# Patient Record
Sex: Female | Born: 1950 | Race: White | Hispanic: No | Marital: Single | Smoking: Current every day smoker
Health system: Southern US, Community
[De-identification: ages and names within clinical notes are randomized; demographics above are authoritative.]

## PROBLEM LIST (undated history)

## (undated) DIAGNOSIS — K859 Acute pancreatitis without necrosis or infection, unspecified: Secondary | ICD-10-CM

## (undated) DIAGNOSIS — I613 Nontraumatic intracerebral hemorrhage in brain stem: Secondary | ICD-10-CM

## (undated) DIAGNOSIS — M1990 Unspecified osteoarthritis, unspecified site: Secondary | ICD-10-CM

## (undated) DIAGNOSIS — R569 Unspecified convulsions: Secondary | ICD-10-CM

## (undated) DIAGNOSIS — IMO0001 Reserved for inherently not codable concepts without codable children: Secondary | ICD-10-CM

## (undated) DIAGNOSIS — I10 Essential (primary) hypertension: Secondary | ICD-10-CM

## (undated) DIAGNOSIS — F32A Depression, unspecified: Secondary | ICD-10-CM

## (undated) DIAGNOSIS — I639 Cerebral infarction, unspecified: Secondary | ICD-10-CM

## (undated) DIAGNOSIS — G43909 Migraine, unspecified, not intractable, without status migrainosus: Secondary | ICD-10-CM

## (undated) DIAGNOSIS — E7800 Pure hypercholesterolemia, unspecified: Secondary | ICD-10-CM

## (undated) DIAGNOSIS — J449 Chronic obstructive pulmonary disease, unspecified: Secondary | ICD-10-CM

## (undated) DIAGNOSIS — B1920 Unspecified viral hepatitis C without hepatic coma: Secondary | ICD-10-CM

## (undated) DIAGNOSIS — J9690 Respiratory failure, unspecified, unspecified whether with hypoxia or hypercapnia: Secondary | ICD-10-CM

## (undated) DIAGNOSIS — F17210 Nicotine dependence, cigarettes, uncomplicated: Secondary | ICD-10-CM

## (undated) DIAGNOSIS — I6529 Occlusion and stenosis of unspecified carotid artery: Secondary | ICD-10-CM

## (undated) DIAGNOSIS — F419 Anxiety disorder, unspecified: Secondary | ICD-10-CM

## (undated) DIAGNOSIS — K219 Gastro-esophageal reflux disease without esophagitis: Secondary | ICD-10-CM

## (undated) DIAGNOSIS — F329 Major depressive disorder, single episode, unspecified: Secondary | ICD-10-CM

## (undated) HISTORY — DX: Major depressive disorder, single episode, unspecified: F32.9

## (undated) HISTORY — PX: CHOLECYSTECTOMY: SHX55

## (undated) HISTORY — DX: Occlusion and stenosis of unspecified carotid artery: I65.29

## (undated) HISTORY — DX: Depression, unspecified: F32.A

## (undated) HISTORY — DX: Pure hypercholesterolemia, unspecified: E78.00

## (undated) HISTORY — PX: APPENDECTOMY: SHX54

## (undated) HISTORY — PX: ABDOMINAL HYSTERECTOMY: SHX81

---

## 2004-07-18 ENCOUNTER — Emergency Department: Payer: Self-pay | Admitting: Emergency Medicine

## 2004-10-13 ENCOUNTER — Inpatient Hospital Stay: Payer: Self-pay | Admitting: Internal Medicine

## 2006-02-19 ENCOUNTER — Emergency Department: Payer: Self-pay | Admitting: Emergency Medicine

## 2006-05-28 ENCOUNTER — Emergency Department: Payer: Self-pay | Admitting: Unknown Physician Specialty

## 2006-05-28 ENCOUNTER — Other Ambulatory Visit: Payer: Self-pay

## 2006-06-01 ENCOUNTER — Emergency Department: Payer: Self-pay | Admitting: General Practice

## 2006-07-01 ENCOUNTER — Emergency Department: Payer: Self-pay | Admitting: Emergency Medicine

## 2006-07-13 ENCOUNTER — Emergency Department: Payer: Self-pay | Admitting: Emergency Medicine

## 2006-07-14 ENCOUNTER — Emergency Department: Payer: Self-pay | Admitting: Emergency Medicine

## 2006-07-14 ENCOUNTER — Other Ambulatory Visit: Payer: Self-pay

## 2006-07-27 ENCOUNTER — Emergency Department: Payer: Self-pay | Admitting: Emergency Medicine

## 2008-04-22 ENCOUNTER — Ambulatory Visit: Payer: Self-pay | Admitting: Specialist

## 2008-06-02 ENCOUNTER — Observation Stay (HOSPITAL_COMMUNITY): Admission: RE | Admit: 2008-06-02 | Discharge: 2008-06-03 | Payer: Self-pay | Admitting: Neurosurgery

## 2010-07-23 HISTORY — PX: BACK SURGERY: SHX140

## 2010-09-27 ENCOUNTER — Ambulatory Visit: Payer: Self-pay | Admitting: Gastroenterology

## 2010-10-02 LAB — PATHOLOGY REPORT

## 2010-12-05 NOTE — Op Note (Signed)
NAMESHAWNICE, Tammy Armstrong                  ACCOUNT NO.:  [id number]   MEDICAL RECORD NO.:  [id number]          PATIENT TYPE:  INP   LOCATION:  2899                         FACILITY:  [facility]   PHYSICIAN:  Donalee Citrin, M.D.        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   PROCEDURE:  Decompressive lumbar laminectomy for microscopic resection  of a right-sided synovial cyst L5-S1 with microdissection of the right  L5 and S1 nerve roots.   SURGEON.:  Donalee Citrin, MD   ASSISTANT:  Yetta Barre.   ANESTHESIA.:  General endotracheal.   HISTORY OF PRESENT ILLNESS:  The patient is a very pleasant 60 year old  female with progressive worsening back and right leg pain radiating down  the back of her leg down to the ball of the foot as well as occasionally  in her big toe.  MRI scan showed a large synovial cyst displacing the S1  nerve root and also extending up into the undersurface of the L5 nerve  root and the foramen.  The patient failed all forms of conservative  treatment.  I saw no signs of instability and predominantly right  radicular pain as opposed to back pain, so it was recommended a  cystectomy. Risk and benefits of the operation was explained.  The  patient understands and wishes to proceed forward.   PROCEDURE IN DETAIL:  The patient is brought to the OR, was induced with  general anesthesia, positioned prone on the Wilson frame. Back was  prepped and draped in the usual sterile fashion.  Preop incision was  localized at the appropriate level at L5-S1.  After infiltration of 10  mL of lidocaine with epinephrine, a midline incision was made, and Bovie  electrocautery was used to take down into the subperiosteum.  Dissection  was carried down to the lamina of L5-S1 on the right.  Intraoperative x-  ray __________ at  appropriate level.  Then the inferior aspect of L5 and  medial facet complex and superior aspect of S1 was drilled down.  Laminotomy was begun with __________ Kerrison punch exposing the  ligamentum flavum was removed in a piecemeal fashion exposing the thecal  sac.  Immediately visualized was a dense adherent inflammatory membrane  against the thecal sac which the plane get around it was very difficult  to achieve, so the laminotomy was extended up to the level of the L5  pedicle and down the level of the S1 pedicle and working underneath the  facet complex flushed with the pedicle.  The proximal takeoff of the L5  nerve root was identified.  The membrane overlying the L5 nerve root  medially into the cyst which was displacing the L5 nerve root superiorly  because the cyst was coming up to the inferior aspect of the L5 nerve  root was developed.  This was moved piecemeal fashion with 2 and 3-mm  Kerrison punch.  This  helped resect some of the densely adherent  membrane along the lateral margin of the thecal sac and canal and this  was done, marching down to the S1 pedicle.  The S1 nerve root was  identified, further membrane was resected off the lateral margin of the  S1 nerve root and flushed with pedicle and this all marched up,  connecting these two areas.  The disk space was identified.  The  undersurface of the medial facet complex was aggressively under bitten,  removing all remnants of residual synovial cyst.  At the end of the  cystectomy there was no further stenosis on either the L5 or the S1  nerve roots.  Coronary dilator and __________ were easily passed out of  the foramen.  The wound was then copiously irrigated and meticulous  hemostasis was maintained.  Gelfoam was laid on top of the dura muscle  fascia  approximated layers with interrupted Vicryl and the skin was closed  running 4-0 subcuticular.  Benzoin and Steri-Strips applied.  The  patient went to recovery room in  stable condition.  At the end of the  case needle and sponge counts were correct.           ______________________________  Donalee Citrin, M.D.     GC/MEDQ  D:  06/02/2008  T:  06/03/2008  Job:  [id number]

## 2011-04-24 LAB — BASIC METABOLIC PANEL
BUN: 7
CO2: 28
Chloride: 102
Creatinine, Ser: 0.65
GFR calc non Af Amer: >60
Glucose, Bld: 207 — ABNORMAL HIGH
Sodium: 137

## 2011-04-24 LAB — GLUCOSE, CAPILLARY
Glucose-Capillary: 190 — ABNORMAL HIGH
Glucose-Capillary: 247 — ABNORMAL HIGH

## 2011-04-24 LAB — CBC
Hemoglobin: 14
MCV: 93.4
Platelets: 222
RBC: 4.33
RDW: 13.4
WBC: 8.9

## 2011-06-02 ENCOUNTER — Emergency Department: Payer: Self-pay | Admitting: Emergency Medicine

## 2011-06-19 ENCOUNTER — Emergency Department: Payer: Self-pay | Admitting: *Deleted

## 2011-07-01 ENCOUNTER — Inpatient Hospital Stay (HOSPITAL_COMMUNITY)
Admission: EM | Admit: 2011-07-01 | Discharge: 2011-07-04 | DRG: 064 | Disposition: A | Discharge status: Home / Self Care | Payer: Medicare Other | Attending: Pulmonary Disease | Admitting: Pulmonary Disease

## 2011-07-01 ENCOUNTER — Emergency Department (HOSPITAL_COMMUNITY): Payer: Medicare Other

## 2011-07-01 ENCOUNTER — Other Ambulatory Visit: Payer: Self-pay

## 2011-07-01 ENCOUNTER — Emergency Department: Payer: Self-pay | Admitting: Emergency Medicine

## 2011-07-01 ENCOUNTER — Encounter: Payer: Self-pay | Admitting: Emergency Medicine

## 2011-07-01 DIAGNOSIS — I619 Nontraumatic intracerebral hemorrhage, unspecified: Principal | ICD-10-CM | POA: Diagnosis present

## 2011-07-01 DIAGNOSIS — J209 Acute bronchitis, unspecified: Secondary | ICD-10-CM | POA: Diagnosis present

## 2011-07-01 DIAGNOSIS — J449 Chronic obstructive pulmonary disease, unspecified: Secondary | ICD-10-CM | POA: Diagnosis present

## 2011-07-01 DIAGNOSIS — J440 Chronic obstructive pulmonary disease with acute lower respiratory infection: Secondary | ICD-10-CM | POA: Diagnosis present

## 2011-07-01 DIAGNOSIS — E119 Type 2 diabetes mellitus without complications: Secondary | ICD-10-CM | POA: Diagnosis present

## 2011-07-01 DIAGNOSIS — G43909 Migraine, unspecified, not intractable, without status migrainosus: Secondary | ICD-10-CM | POA: Diagnosis present

## 2011-07-01 DIAGNOSIS — J9600 Acute respiratory failure, unspecified whether with hypoxia or hypercapnia: Secondary | ICD-10-CM | POA: Diagnosis present

## 2011-07-01 DIAGNOSIS — F419 Anxiety disorder, unspecified: Secondary | ICD-10-CM | POA: Diagnosis present

## 2011-07-01 DIAGNOSIS — R509 Fever, unspecified: Secondary | ICD-10-CM | POA: Diagnosis present

## 2011-07-01 DIAGNOSIS — Z79899 Other long term (current) drug therapy: Secondary | ICD-10-CM

## 2011-07-01 DIAGNOSIS — I613 Nontraumatic intracerebral hemorrhage in brain stem: Secondary | ICD-10-CM

## 2011-07-01 DIAGNOSIS — E876 Hypokalemia: Secondary | ICD-10-CM | POA: Diagnosis not present

## 2011-07-01 DIAGNOSIS — F19939 Other psychoactive substance use, unspecified with withdrawal, unspecified: Secondary | ICD-10-CM | POA: Diagnosis present

## 2011-07-01 DIAGNOSIS — R569 Unspecified convulsions: Secondary | ICD-10-CM

## 2011-07-01 DIAGNOSIS — J411 Mucopurulent chronic bronchitis: Secondary | ICD-10-CM | POA: Diagnosis present

## 2011-07-01 DIAGNOSIS — I10 Essential (primary) hypertension: Secondary | ICD-10-CM | POA: Diagnosis present

## 2011-07-01 DIAGNOSIS — B1920 Unspecified viral hepatitis C without hepatic coma: Secondary | ICD-10-CM | POA: Diagnosis present

## 2011-07-01 DIAGNOSIS — F411 Generalized anxiety disorder: Secondary | ICD-10-CM | POA: Diagnosis present

## 2011-07-01 DIAGNOSIS — J9690 Respiratory failure, unspecified, unspecified whether with hypoxia or hypercapnia: Secondary | ICD-10-CM | POA: Diagnosis present

## 2011-07-01 DIAGNOSIS — F1320 Sedative, hypnotic or anxiolytic dependence, uncomplicated: Secondary | ICD-10-CM | POA: Diagnosis present

## 2011-07-01 HISTORY — DX: Cerebral infarction, unspecified: I63.9

## 2011-07-01 HISTORY — DX: Migraine, unspecified, not intractable, without status migrainosus: G43.909

## 2011-07-01 HISTORY — DX: Unspecified viral hepatitis C without hepatic coma: B19.20

## 2011-07-01 HISTORY — DX: Anxiety disorder, unspecified: F41.9

## 2011-07-01 HISTORY — DX: Chronic obstructive pulmonary disease, unspecified: J44.9

## 2011-07-01 HISTORY — DX: Respiratory failure, unspecified, unspecified whether with hypoxia or hypercapnia: J96.90

## 2011-07-01 HISTORY — DX: Unspecified convulsions: R56.9

## 2011-07-01 HISTORY — DX: Essential (primary) hypertension: I10

## 2011-07-01 HISTORY — DX: Nontraumatic intracerebral hemorrhage in brain stem: I61.3

## 2011-07-01 LAB — POCT I-STAT 3, ART BLOOD GAS (G3+)
Acid-Base Excess: 2 mmol/L (ref 0.0–2.0)
Bicarbonate: 26 mEq/L — ABNORMAL HIGH (ref 20.0–24.0)
pCO2 arterial: 37.1 mmHg (ref 35.0–45.0)
pO2, Arterial: 175 mmHg — ABNORMAL HIGH (ref 80.0–100.0)

## 2011-07-01 LAB — DIFFERENTIAL
Basophils Absolute: 0 10*3/uL (ref 0.0–0.1)
Monocytes Absolute: 1.2 10*3/uL — ABNORMAL HIGH (ref 0.1–1.0)
Monocytes Relative: 8 % (ref 3–12)
Neutrophils Relative %: 56 % (ref 43–77)

## 2011-07-01 LAB — CBC
HCT: 36.8 % (ref 36.0–46.0)
MCH: 33.3 pg (ref 26.0–34.0)
MCHC: 36.7 g/dL — ABNORMAL HIGH (ref 30.0–36.0)
MCV: 90.9 fL (ref 78.0–100.0)

## 2011-07-01 MED ORDER — SODIUM CHLORIDE 0.9 % IV SOLN
100.0000 mg | Freq: Three times a day (TID) | INTRAVENOUS | Status: DC
  Administered 2011-07-02 (×4): 100 mg via INTRAVENOUS
  Filled 2011-07-01 (×7): qty 2

## 2011-07-01 NOTE — ED Notes (Signed)
Respiratory at bedside collecting arterial blood gas.

## 2011-07-01 NOTE — ED Notes (Signed)
Respiratory at bedside upon patient arrival to room.

## 2011-07-01 NOTE — ED Notes (Addendum)
Patient transfer from [facility].  Patient was brought to [facility] hospital by EMS for possible withdrawal from Xanax; patient sent to waiting room. While in waiting room, patient had a witnessed seizure.  Patient had trouble breathing after seizure, so she was intubated.  Patient went sent for a CT scan and diagnosed with a Pontine (5 mm) head bleed; accepted by neuro at [facility] (Dr. Beryle Beams).  Patient was placed in C-collar and head blocks by [facility] due to possible fall and neck injury during seizure.  Patient has two IVs; 20 GA in L AC and 20 GA in L wrist.  [facility] also inserted OG Tube and Foley Catheter.  Vent settings include: Assist Control; Rate 12; Tidal Volume 500; FiO2 50%; PEEP 5.  Patient received 10 mg Vecuronium and 4 mg Versed around 2120.  Patient also received 1000 mg Cerebyx.  Dr. Olin Pia at bedside now.

## 2011-07-01 NOTE — ED Provider Notes (Signed)
History     CSN: [id number] Arrival date & time: 07/01/2011 10:29 PM   First MD Initiated Contact with Patient 07/01/11 2246      Chief Complaint  Patient presents with  . Head Injury    Intracranial Bleed    (Consider location/radiation/quality/duration/timing/severity/associated sxs/prior treatment) The history is provided by the EMS personnel.   Level V caveat for immediate intervention. History exclusively obtained from EMS transport. Patient is a 60 yo female who is a transfer from [facility].  She initially presented there with complaint of withdrawal symptoms. On the waiting room she reportedly had a witnessed seizure. She was intubated for airway protection and possible agonal respirations.  CT scan immediately done there and showed 5 mm Pontine hemorrhage. She initially was hypertensive with systolic pressures in the 190 but with transport her pressures were under 160.  Patient was given paralytic and sedative about 30-45 minutes prior to arrival. Was moving both upper extremities prior to sedation and paralysis according to EMS.   Past Medical History  Diagnosis Date  . Migraines   . Hypertension   . Diabetes mellitus   . Hepatitis C   . Anxiety     Past Surgical History  Procedure Date  . Abdominal hysterectomy   . Cholecystectomy     History reviewed. No pertinent family history.  History  Substance Use Topics  . Smoking status: Not on file  . Smokeless tobacco: Not on file  . Alcohol Use:     OB History    Grav Para Term Preterm Abortions TAB SAB Ect Mult Living                  Review of Systems  Unable to perform ROS: Intubated    Allergies  Imitrex; Morphine and related; and Toradol  Home Medications   Current Outpatient Rx  Name Route Sig Dispense Refill  . IPRATROPIUM-ALBUTEROL 18-103 MCG/ACT IN AERO Inhalation Inhale 2 puffs into the lungs every 6 (six) hours as needed. For shortness of breath     . DULOXETINE HCL 30 MG PO CPEP  Oral Take 30 mg by mouth daily.      Marland Kitchen ESTROGENS CONJUGATED 1.25 MG PO TABS Oral Take 1.25 mg by mouth daily.      Marland Kitchen GABAPENTIN 100 MG PO CAPS Oral Take 100 mg by mouth 3 (three) times daily.      Marland Kitchen GLIPIZIDE 10 MG PO TABS Oral Take 10 mg by mouth 2 (two) times daily before a meal.      . LISINOPRIL 10 MG PO TABS Oral Take 10 mg by mouth daily.      Marland Kitchen LORAZEPAM 0.5 MG PO TABS Oral Take 0.5 mg by mouth every 8 (eight) hours.      Marland Kitchen METOPROLOL SUCCINATE ER 50 MG PO TB24 Oral Take 50 mg by mouth daily.      Marland Kitchen MONTELUKAST SODIUM 10 MG PO TABS Oral Take 10 mg by mouth at bedtime.      Marland Kitchen ROSIGLITAZONE MALEATE 4 MG PO TABS Oral Take 4 mg by mouth daily.      . SERTRALINE HCL 100 MG PO TABS Oral Take 100 mg by mouth daily.      Marland Kitchen SIMVASTATIN 20 MG PO TABS Oral Take 20 mg by mouth at bedtime.      Marland Kitchen SITAGLIPTIN PHOSPHATE 100 MG PO TABS Oral Take 100 mg by mouth daily.      . TRAZODONE HCL 100 MG PO TABS Oral Take 100  mg by mouth at bedtime.        BP 142/70  Pulse 81  Resp 14  Ht 5\' 6"  (1.676 m)  Wt 175 lb (79.379 kg)  BMI 28.25 kg/m2  SpO2 100%  Physical Exam  Nursing note and vitals reviewed. Constitutional: She appears well-developed and well-nourished. No distress.       Intubated and sedated  HENT:  Head: Normocephalic and atraumatic.  Nose: Nose normal.  Neck: No JVD present.       C-collar in place  Cardiovascular: Normal rate and intact distal pulses.   Pulmonary/Chest: Effort normal. No respiratory distress.       Breath sounds equal bilaterally with good air movement  Abdominal: Soft. She exhibits no distension. There is no tenderness.       No visible injury  Musculoskeletal: Normal range of motion.       No pain with range of motion and no visible injury  Neurological: She is unresponsive. No cranial nerve deficit. Coordination normal. GCS eye subscore is 1. GCS verbal subscore is 1. GCS motor subscore is 1.       No movement; sedated and paralyzed  Skin: Skin is warm and  dry. She is not diaphoretic.    ED Course  Procedures (including critical care time)  Labs Reviewed  CBC - Abnormal; Notable for the following:    WBC 14.8 (*)    MCHC 36.7 (*)    All other components within normal limits  DIFFERENTIAL - Abnormal; Notable for the following:    Neutro Abs 8.3 (*)    Lymphs Abs 5.1 (*)    Monocytes Absolute 1.2 (*)    All other components within normal limits  BASIC METABOLIC PANEL - Abnormal; Notable for the following:    Potassium 2.3 (*)    Glucose, Bld 217 (*)    Creatinine, Ser 0.49 (*)    Calcium 8.0 (*)    All other components within normal limits  POCT I-STAT 3, BLOOD GAS (G3+) - Abnormal; Notable for the following:    pH, Arterial 7.453 (*)    pO2, Arterial 175.0 (*)    Bicarbonate 26.0 (*)    All other components within normal limits  PROTIME-INR  PHENYTOIN LEVEL, TOTAL  BLOOD GAS, ARTERIAL  CBC  BASIC METABOLIC PANEL   Dg Chest Port 1v Same Day  07/01/2011  *RADIOLOGY REPORT*  Clinical Data: Intubation.  PORTABLE CHEST - 1 VIEW SAME DAY  Comparison: None.  Findings: Endotracheal tube in place with tip about 3.5 cm above the carina.  An enteric tube is present.  The tip is not visualized but is below the level of the left hemidiaphragm consistent with location in the stomach.  Shallow inspiration.  Heart size and pulmonary vascularity are normal respiratory effort.  Mild increased density over the left hemithorax likely due to soft tissue attenuation.  No focal airspace consolidation.  No pneumothorax.  No blunting of costophrenic angles.  IMPRESSION: Appliances appear in satisfactory location.  Shallow inspiration. No pneumothorax.  Original Report Authenticated By: Marlon Pel, M.D.    Date: 07/01/2011  Rate: 78  Rhythm: normal sinus rhythm  QRS Axis: normal  Intervals: normal  ST/T Wave abnormalities: normal  Conduction Disutrbances:none  Narrative Interpretation: no acute ischemia  Old EKG Reviewed: unchanged    1.  Pontine hemorrhage   2. Seizure       MDM   Patient here is transferred with acute intracranial hemorrhage. She is a 5 mm Pontine hemorrhage.  This was diagnosed at outside hospital. She is intubated reportedly for airway protection. An initial evaluation patient is GCS 3 but she had recently been given paralytic and sedative. Blood pressure is under 160. Neurology alert patient in at bedside shortly after patient arrival. Neurosurgery was also aware of patient from outside hospital MD and based on scans no intervention at this time.  Chest x-ray confirmed ET tube placement. Metabolic panel shows hypokalemia, will replace.  Critical care consult therefore ventilated patient with acute head bleed. We will admit patient. While in emergency department patient had significant improvement in her neurologic exam. She was opening her eyes, moving all extremities, had intact sensation, and was following commands.          Milus Glazier 07/02/11 [phone]

## 2011-07-01 NOTE — Consult Note (Signed)
  Reason for Consult: pontine hemorrhage  Referring Physician: Dr Buford Dresser [facility])  Tammy Armstrong is an 60 y.o. female.  No family present.  Hx per conversation with [place] ER MD (Dr Buford Dresser)  HPI:  60 YO WF with h/o HTN and DM seen in transfer from [facility]. Patient was brought to [place] hospital by EMS for possible withdrawal from Xanax; patient sent to waiting room. While in waiting room, patient had a witnessed seizure. Patient had trouble breathing after seizure, so she was intubated. Head CT scan revealed Pontine (5 mm) head bleed;  Dr Buford Dresser noted she discussed case with Neurosurgery (? Dr Sheila Oats) who advised Neurology eval. Patient was placed in C-collar and head blocks by [facility] due to possible fall and neck injury during seizure. Also received 10 mg Vecuronium and 4 mg Versed in addition to Cerebyx.   EMS noted her SBP remained in 160's in transport.  Only visualized to have slight movement of arms.     Past Medical History  Diagnosis Date  . Migraines   . Hypertension   . Diabetes mellitus   . Hepatitis C   . Anxiety     Past Surgical History  Procedure Date  . Abdominal hysterectomy   . Cholecystectomy     History reviewed. No pertinent family history.  Social History:  does not have a smoking history on file. She does not have any smokeless tobacco history on file. Her alcohol and drug histories not on file.  Allergies:  Allergies  Allergen Reactions  . Imitrex (Sumatriptan Base)   . Morphine And Related   . Toradol     Medications: Scheduled:    No results found for this or any previous visit (from the past 48 hour(s)).  No results found.  Review of Systems: Review of systems not obtained due to patient factors.  Blood pressure 152/73, pulse 81, resp. rate 12, height 5' (1.524 m), SpO2 100.00%.  Neurological Exam:  Patient is currently intubated and spontaneously opens eyes. Few breaths above vent.  Appears able to nod "yes/no"  to simple questions.  EOMI, PERRL (about 3-4 mm).  Face symmetric, otherwise CN's II-XII grossly intact. Motor: Trace movements of arms> legs (to command).  Sensation, cerebellar unable to be currently tested.  DTR's 1+ throughout with toes equivocal  Cardiac: RRR Lungs: intubated, CTAB Abd: obese, NT/ND Carotids: unable to do at present...hard collar in place  Testing: Head CT [facility] hospital) reviewed and shows approx 6 mm central pontine hemorrhage (? Mildly hyperdense basilar), o/w negative  Assessment/Plan: 1) Pontine hemorrhage (6 mm) 2) Possible new onset seizure (R/O flexor spasms from hemorrhage)  Discussion:  Pt presents with new onset seizure and unresponsiveness.  Although initially noted to be unresponsive, she appears to be more alert at present and able to follow simple commands.  Suspect some of her initial unresponsiveness could be due to use of the paralytic agent.  Also need to consider if her initial seizure activity may have been flexor spasms from her pontine bleed.  Uncontrolled hypertension the most likely etiology of her bleed.  Recc: 1) BP control to keep SBP in 140-160 range 2) hold antiplatelet and anticoagulant agents 3) admission to ICU (on vent) w/ Neurosurgery consult 4) routine labs and Dilantin level.   Continue Dilantin 100 mg tid for now 5) Head MRI and MRA.  Repeat Head CT in 24 hrs 6) EEG   Kingslee Mairena 07/01/2011, 10:59 PM

## 2011-07-01 NOTE — ED Notes (Signed)
X-ray at bedside

## 2011-07-01 NOTE — ED Notes (Signed)
Dr. Olin Pia at bedside; patient is following simple commands (shaking head yes/no to questions), wiggling toes, and moving fingers.  Will continue to monitor.

## 2011-07-02 ENCOUNTER — Inpatient Hospital Stay (HOSPITAL_COMMUNITY): Payer: Medicare Other

## 2011-07-02 ENCOUNTER — Encounter (HOSPITAL_COMMUNITY): Payer: Self-pay | Admitting: *Deleted

## 2011-07-02 DIAGNOSIS — G40309 Generalized idiopathic epilepsy and epileptic syndromes, not intractable, without status epilepticus: Secondary | ICD-10-CM

## 2011-07-02 DIAGNOSIS — I619 Nontraumatic intracerebral hemorrhage, unspecified: Secondary | ICD-10-CM

## 2011-07-02 DIAGNOSIS — J9600 Acute respiratory failure, unspecified whether with hypoxia or hypercapnia: Secondary | ICD-10-CM

## 2011-07-02 DIAGNOSIS — I613 Nontraumatic intracerebral hemorrhage in brain stem: Secondary | ICD-10-CM

## 2011-07-02 DIAGNOSIS — F411 Generalized anxiety disorder: Secondary | ICD-10-CM

## 2011-07-02 DIAGNOSIS — R569 Unspecified convulsions: Secondary | ICD-10-CM

## 2011-07-02 DIAGNOSIS — I629 Nontraumatic intracranial hemorrhage, unspecified: Secondary | ICD-10-CM

## 2011-07-02 HISTORY — DX: Nontraumatic intracerebral hemorrhage in brain stem: I61.3

## 2011-07-02 HISTORY — DX: Unspecified convulsions: R56.9

## 2011-07-02 LAB — BASIC METABOLIC PANEL
BUN: 5 mg/dL — ABNORMAL LOW (ref 6–23)
CO2: 25 mEq/L (ref 19–32)
CO2: 27 mEq/L (ref 19–32)
Calcium: 8 mg/dL — ABNORMAL LOW (ref 8.4–10.5)
Chloride: 99 mEq/L (ref 96–112)
Creatinine, Ser: 0.49 mg/dL — ABNORMAL LOW (ref 0.50–1.10)
Glucose, Bld: 187 mg/dL — ABNORMAL HIGH (ref 70–99)
Glucose, Bld: 217 mg/dL — ABNORMAL HIGH (ref 70–99)
Potassium: 2.3 mEq/L — CL (ref 3.5–5.1)
Potassium: 3.2 mEq/L — ABNORMAL LOW (ref 3.5–5.1)
Sodium: 135 mEq/L (ref 135–145)

## 2011-07-02 LAB — CBC
HCT: 36.8 % (ref 36.0–46.0)
Hemoglobin: 13.3 g/dL (ref 12.0–15.0)
MCHC: 36.1 g/dL — ABNORMAL HIGH (ref 30.0–36.0)
RBC: 4.02 MIL/uL (ref 3.87–5.11)
WBC: 13.9 10*3/uL — ABNORMAL HIGH (ref 4.0–10.5)

## 2011-07-02 LAB — URINALYSIS, ROUTINE W REFLEX MICROSCOPIC
Glucose, UA: NEGATIVE mg/dL
Leukocytes, UA: NEGATIVE
Nitrite: NEGATIVE
Protein, ur: NEGATIVE mg/dL
Urobilinogen, UA: 1 mg/dL (ref 0.0–1.0)

## 2011-07-02 LAB — MRSA PCR SCREENING: MRSA by PCR: NEGATIVE

## 2011-07-02 LAB — GLUCOSE, CAPILLARY: Glucose-Capillary: 223 mg/dL — ABNORMAL HIGH (ref 70–99)

## 2011-07-02 LAB — PHENYTOIN LEVEL, TOTAL: Phenytoin Lvl: 12.1 ug/mL (ref 10.0–20.0)

## 2011-07-02 MED ORDER — PROPOFOL 10 MG/ML IV BOLUS
20.0000 mg | Freq: Once | INTRAVENOUS | Status: AC
  Administered 2011-07-02: 20 mg via INTRAVENOUS
  Filled 2011-07-02: qty 2

## 2011-07-02 MED ORDER — GLIPIZIDE ER 10 MG PO TB24
20.0000 mg | ORAL_TABLET | Freq: Every day | ORAL | Status: DC
  Administered 2011-07-02: 20 mg via ORAL
  Filled 2011-07-02 (×2): qty 2

## 2011-07-02 MED ORDER — PROPOFOL 10 MG/ML IV BOLUS: 20.0000 mg | Freq: Once | INTRAVENOUS | Status: DC

## 2011-07-02 MED ORDER — PROPOFOL 10 MG/ML IV EMUL
5.0000 ug/kg/min | Freq: Once | INTRAVENOUS | Status: AC
  Administered 2011-07-02: 10 ug/kg/min via INTRAVENOUS
  Filled 2011-07-02: qty 20

## 2011-07-02 MED ORDER — POTASSIUM CHLORIDE 10 MEQ/100ML IV SOLN
10.0000 meq | INTRAVENOUS | Status: AC
  Administered 2011-07-02 (×2): 10 meq via INTRAVENOUS

## 2011-07-02 MED ORDER — ALBUTEROL SULFATE (5 MG/ML) 0.5% IN NEBU: 2.5000 mg | INHALATION_SOLUTION | Freq: Four times a day (QID) | RESPIRATORY_TRACT | Status: DC | PRN

## 2011-07-02 MED ORDER — ACETAMINOPHEN 325 MG PO TABS
650.0000 mg | ORAL_TABLET | Freq: Four times a day (QID) | ORAL | Status: DC | PRN
  Administered 2011-07-02 – 2011-07-03 (×3): 650 mg via ORAL
  Filled 2011-07-02 (×2): qty 2

## 2011-07-02 MED ORDER — GABAPENTIN 300 MG PO CAPS
300.0000 mg | ORAL_CAPSULE | Freq: Three times a day (TID) | ORAL | Status: DC
  Administered 2011-07-02 – 2011-07-04 (×7): 300 mg via ORAL
  Filled 2011-07-02 (×9): qty 1

## 2011-07-02 MED ORDER — SIMVASTATIN 20 MG PO TABS
20.0000 mg | ORAL_TABLET | Freq: Every day | ORAL | Status: DC
  Administered 2011-07-02 – 2011-07-03 (×2): 20 mg via ORAL
  Filled 2011-07-02 (×3): qty 1

## 2011-07-02 MED ORDER — SODIUM CHLORIDE 0.9 % IV SOLN
100.0000 mg | INTRAVENOUS | Status: DC
  Filled 2011-07-02: qty 2

## 2011-07-02 MED ORDER — METFORMIN HCL 500 MG PO TABS
500.0000 mg | ORAL_TABLET | Freq: Three times a day (TID) | ORAL | Status: DC
  Administered 2011-07-02: 500 mg via ORAL
  Filled 2011-07-02 (×4): qty 1

## 2011-07-02 MED ORDER — ALPRAZOLAM 0.5 MG PO TABS
1.0000 mg | ORAL_TABLET | Freq: Three times a day (TID) | ORAL | Status: DC
  Administered 2011-07-02 – 2011-07-04 (×7): 1 mg via ORAL
  Filled 2011-07-02 (×7): qty 2

## 2011-07-02 MED ORDER — INSULIN ASPART 100 UNIT/ML ~~LOC~~ SOLN
0.0000 [IU] | Freq: Three times a day (TID) | SUBCUTANEOUS | Status: DC
  Administered 2011-07-02: 3 [IU] via SUBCUTANEOUS
  Administered 2011-07-02 – 2011-07-03 (×3): 5 [IU] via SUBCUTANEOUS
  Administered 2011-07-04: 8 [IU] via SUBCUTANEOUS
  Filled 2011-07-02 (×2): qty 3

## 2011-07-02 MED ORDER — BUDESONIDE-FORMOTEROL FUMARATE 160-4.5 MCG/ACT IN AERO
2.0000 | INHALATION_SPRAY | Freq: Two times a day (BID) | RESPIRATORY_TRACT | Status: DC
  Administered 2011-07-02 – 2011-07-04 (×5): 2 via RESPIRATORY_TRACT
  Filled 2011-07-02: qty 6

## 2011-07-02 MED ORDER — LISINOPRIL 10 MG PO TABS
10.0000 mg | ORAL_TABLET | Freq: Every day | ORAL | Status: DC
  Administered 2011-07-02 – 2011-07-04 (×3): 10 mg via ORAL
  Filled 2011-07-02 (×3): qty 1

## 2011-07-02 MED ORDER — IOHEXOL 350 MG/ML SOLN
75.0000 mL | Freq: Once | INTRAVENOUS | Status: AC | PRN
  Administered 2011-07-02: 75 mL via INTRAVENOUS

## 2011-07-02 MED ORDER — MONTELUKAST SODIUM 10 MG PO TABS
10.0000 mg | ORAL_TABLET | Freq: Every day | ORAL | Status: DC
  Filled 2011-07-02: qty 1

## 2011-07-02 MED ORDER — AZITHROMYCIN 250 MG PO TABS
250.0000 mg | ORAL_TABLET | Freq: Every day | ORAL | Status: DC
  Administered 2011-07-03 – 2011-07-04 (×2): 250 mg via ORAL
  Filled 2011-07-02 (×2): qty 1

## 2011-07-02 MED ORDER — POTASSIUM CHLORIDE 10 MEQ/100ML IV SOLN
10.0000 meq | INTRAVENOUS | Status: AC
  Administered 2011-07-02 (×4): 10 meq via INTRAVENOUS
  Filled 2011-07-02 (×2): qty 100
  Filled 2011-07-02: qty 300
  Filled 2011-07-02: qty 100

## 2011-07-02 MED ORDER — PROPOFOL 10 MG/ML IV EMUL: 5.0000 ug/kg/min | INTRAVENOUS | Status: DC

## 2011-07-02 MED ORDER — METOPROLOL SUCCINATE ER 50 MG PO TB24
50.0000 mg | ORAL_TABLET | Freq: Every day | ORAL | Status: DC
  Administered 2011-07-02 – 2011-07-04 (×3): 50 mg via ORAL
  Filled 2011-07-02 (×3): qty 1

## 2011-07-02 MED ORDER — LINAGLIPTIN 5 MG PO TABS
5.0000 mg | ORAL_TABLET | Freq: Every day | ORAL | Status: DC
  Administered 2011-07-02: 5 mg via ORAL
  Filled 2011-07-02: qty 1

## 2011-07-02 MED ORDER — SODIUM CHLORIDE 0.9 % IV SOLN
250.0000 mL | INTRAVENOUS | Status: DC | PRN
  Administered 2011-07-02: 250 mL via INTRAVENOUS
  Administered 2011-07-02: 04:00:00 via INTRAVENOUS

## 2011-07-02 MED ORDER — AZITHROMYCIN 500 MG PO TABS
500.0000 mg | ORAL_TABLET | Freq: Every day | ORAL | Status: AC
  Administered 2011-07-02: 500 mg via ORAL
  Filled 2011-07-02: qty 1

## 2011-07-02 NOTE — Progress Notes (Signed)
*  PRELIMINARY RESULTS* EEG has been performed.  Markus Jarvis 07/02/2011, 2:22 PM

## 2011-07-02 NOTE — Progress Notes (Signed)
CSW received referral for homelessness issues. Spoke with RN and reviewed chart. Pt resting at present. CSW will follow to complete assessment and offer referrals as appropriate to pt needs.  Baxter Flattery, MSW [phone]

## 2011-07-02 NOTE — Progress Notes (Signed)
Pt to radiology with transport

## 2011-07-02 NOTE — Consult Note (Signed)
Patient name: Tammy Armstrong Medical record number: [id number] Date of birth: 07-04-1951 Age: 60 y.o. Gender: female PCP: No primary provider on file.  Date: 07/02/2011 Reason for Consult: mechanical ventilation Referring Physician: Olin Pia  Brief history This is a 60yo F who presented to [facility] with seizure and small pontine hemorrhage.  Lines/tubes none  Culture data/sepsis markers none  Antibiotics none  Best practice none  Protocols/consults Neurology Neurosurgery  Events/studies n/a  HPI: This is a 60yo F who went to [facility] ER because she felt like she was withdrawing from xanax which she takes regularly but which had been stolen recently. In the lobby/triage there she collapsed and had witnessed seizure, at some point during the course of this she was intubated for airway protection. She had non-contrast CT of the head that showed ~40mm pontine hemorrhage.  Past Medical History  Diagnosis Date  . Migraines   . Hypertension   . Diabetes mellitus   . Hepatitis C   . Anxiety    Past Surgical History  Procedure Date  . Abdominal hysterectomy   . Cholecystectomy    History reviewed. No pertinent family history.  Social History:  does not have a smoking history on file. She does not have any smokeless tobacco history on file. Her alcohol and drug histories not on file.  Allergies:  Allergies  Allergen Reactions  . Phenergan     Can't talk when taking  . Imitrex (Sumatriptan Base)   . Morphine And Related   . Toradol    Medications:  Prior to Admission medications   Medication Sig Start Date End Date Taking? Authorizing Provider  albuterol-ipratropium (COMBIVENT) 18-103 MCG/ACT inhaler Inhale 2 puffs into the lungs every 6 (six) hours as needed. For shortness of breath    Yes Historical Provider, MD  DULoxetine (CYMBALTA) 30 MG capsule Take 30 mg by mouth daily.     Yes Historical Provider, MD  estrogens, conjugated, (PREMARIN) 1.25 MG tablet Take 1.25 mg by  mouth daily.     Yes Historical Provider, MD  gabapentin (NEURONTIN) 100 MG capsule Take 100 mg by mouth 3 (three) times daily.     Yes Historical Provider, MD  glipiZIDE (GLUCOTROL) 10 MG tablet Take 10 mg by mouth 2 (two) times daily before a meal.     Yes Historical Provider, MD  lisinopril (PRINIVIL,ZESTRIL) 10 MG tablet Take 10 mg by mouth daily.     Yes Historical Provider, MD  LORazepam (ATIVAN) 0.5 MG tablet Take 0.5 mg by mouth every 8 (eight) hours.     Yes Historical Provider, MD  metoprolol (TOPROL-XL) 50 MG 24 hr tablet Take 50 mg by mouth daily.     Yes Historical Provider, MD  montelukast (SINGULAIR) 10 MG tablet Take 10 mg by mouth at bedtime.     Yes Historical Provider, MD  rosiglitazone (AVANDIA) 4 MG tablet Take 4 mg by mouth daily.     Yes Historical Provider, MD  sertraline (ZOLOFT) 100 MG tablet Take 100 mg by mouth daily.     Yes Historical Provider, MD  simvastatin (ZOCOR) 20 MG tablet Take 20 mg by mouth at bedtime.     Yes Historical Provider, MD  sitaGLIPtin (JANUVIA) 100 MG tablet Take 100 mg by mouth daily.     Yes Historical Provider, MD  traZODone (DESYREL) 100 MG tablet Take 100 mg by mouth at bedtime.     Yes Historical Provider, MD    Temp:  [98.6 F (37 C)-99 F (37.2 C)] 99  F (37.2 C) (12/10 0309) Pulse Rate:  [75-96] 79  (12/10 0400) Resp:  [12-24] 22  (12/10 0400) BP: (102-153)/(60-99) 149/63 mmHg (12/10 0400) SpO2:  [94 %-100 %] 94 % (12/10 0400) FiO2 (%):  [40 %-50 %] 50 % (12/10 0030) Weight:  [79.379 kg (175 lb)] 175 lb (79.379 kg) (12/10 0012)  gen NAD ENT moist mucous membranes Neck no masses or JVD, midline tenderness posteriorly Lymph no cervical or supraclavicular lymphadenopathy CV RRR, no murmur pulm CTAB without wheeszes or crackles abd soft, non-tender, and non-distended Ext no LE edema or clubbing Skin no rashes Neuro PERRLA 5mm bilat, CN 2-12 grossly intact, no gross motor/sesory deficits   LAB RESULT Lab Results  Component  Value Date   CREATININE 0.49* 07/01/2011   BUN 6 07/01/2011   NA 135 07/01/2011   K 2.3* 07/01/2011   CL 99 07/01/2011   CO2 25 07/01/2011   Lab Results  Component Value Date   WBC 14.8* 07/01/2011   HGB 13.5 07/01/2011   HCT 36.8 07/01/2011   MCV 90.9 07/01/2011   PLT 181 07/01/2011   No results found for this basename: ALT, AST, GGT, ALKPHOS, BILITOT   Lab Results  Component Value Date   INR 1.11 07/01/2011   Assessment and Plan  This is a 60yo F who presented to [facility] with seizure and pontine hemorrhage. Currently clinically and neurolgically stable and extubated in ER.  1. CVA -- neurology following, neurosurgery aware -- goal SBPs 14-160 range -- MRI brain, MRA -- EEG ordered, not sure if this is necessary at this point given exam, etc -- repeat head CT in 24h -- cont fosphenytoin  2. cerv spine tenderness -- cont c-collar for now, cannot clear -- CT to r/o fracture  3. Anxiety -- restart home xanax now  4. COPD -- bronchodilators  5. T2DM -- cont home po agents  FULL CODE  The patient is critically ill with CVA/ICH and seizures and requires high complexity decision making for assessment and support, frequent evaluation and titration of therapies, application of advanced monitoring technologies and extensive interpretation of multiple databases.  Total critical care time excluding procedures: 60 min  Joie Bimler MD, MPH    Sampson Self 07/02/2011, 4:16 AM

## 2011-07-02 NOTE — ED Notes (Signed)
Patient following commands; pointing at ETT and stating that she wants the tube out.  ED resident notified; waiting on critical care physician to come down and assess patient.  At this time, patient is not pulling at tubes or lines.  Resident explained to patient that tube needs to remain in until she is evaluated by the critical care MD.  Will continue to monitor.

## 2011-07-02 NOTE — ED Notes (Signed)
Patient currently resting quietly in bed; no respiratory or acute distress noted.  Will continue to monitor. 

## 2011-07-02 NOTE — Progress Notes (Signed)
Inpatient Diabetes Program Recommendations  AACE/ADA: New Consensus Statement on Inpatient Glycemic Control (2009)  Target Ranges:  Prepandial:   less than 140 mg/dL      Peak postprandial:   less than 180 mg/dL (1-2 hours)      Critically ill patients:  140 - 180 mg/dL   AM CBG today: 161 mg/dl  Inpatient Diabetes Program Recommendations Correction (SSI): Please start Novolog Moderate correction scale tid ac + HS. Oral Agents: Noted home oral DM meds started today. HgbA1C: Please check HgbA1C to assess pre-hospital glucose control.

## 2011-07-02 NOTE — Progress Notes (Signed)
Pt extubated by CCM MD. Pt placed on 2lpm [place]. Pt tolerating at this time. RT Will continue to monitor.
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
## 2011-07-02 NOTE — ED Notes (Signed)
Calling report now. 

## 2011-07-02 NOTE — ED Notes (Signed)
Patient being transported to 3100 on portable cardiac monitor with RN and nurse tele tech.

## 2011-07-02 NOTE — ED Notes (Signed)
Gave report to [place], Charity fundraiser.  Informed Boneta Lucks that the orders have already been released by ordering physician (orders were never held).  Per Dr. Olin Pia, Cerebyx not to be given in ED.  Informed Boneta Lucks that first run of potassium has finished, and that patient is scheduled for MRI in the morning.  Only orders that were acknowledged down in ED were orders that were acted upon.  Preparing patient for transport.

## 2011-07-02 NOTE — ED Notes (Signed)
Patient currently sitting up in bed; no respiratory or acute distress noted.  Patient alert and oriented x4; PERRL present.  Patient talking on cell phone with family members/friends.  Patient complaining of headache at this time (7/10); patient has no other questions or concerns.  Will continue to monitor.

## 2011-07-02 NOTE — ED Notes (Signed)
[place], PCCM extubated patient.  Patient currently sitting up in bed; no respiratory or acute distress noted.  Will continue to monitor.

## 2011-07-02 NOTE — ED Notes (Signed)
Critical Care MD/Intensivist at bedside explaining plan of care to patient.
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
## 2011-07-03 DIAGNOSIS — F411 Generalized anxiety disorder: Secondary | ICD-10-CM

## 2011-07-03 DIAGNOSIS — G40309 Generalized idiopathic epilepsy and epileptic syndromes, not intractable, without status epilepticus: Secondary | ICD-10-CM

## 2011-07-03 DIAGNOSIS — J9600 Acute respiratory failure, unspecified whether with hypoxia or hypercapnia: Secondary | ICD-10-CM

## 2011-07-03 DIAGNOSIS — I629 Nontraumatic intracranial hemorrhage, unspecified: Secondary | ICD-10-CM

## 2011-07-03 LAB — BASIC METABOLIC PANEL
BUN: 7 mg/dL (ref 6–23)
CO2: 29 mEq/L (ref 19–32)
Calcium: 7.8 mg/dL — ABNORMAL LOW (ref 8.4–10.5)
Chloride: 102 mEq/L (ref 96–112)
Creatinine, Ser: 0.61 mg/dL (ref 0.50–1.10)
Glucose, Bld: 89 mg/dL (ref 70–99)

## 2011-07-03 LAB — CBC
HCT: 34.5 % — ABNORMAL LOW (ref 36.0–46.0)
MCH: 32.9 pg (ref 26.0–34.0)
MCV: 93 fL (ref 78.0–100.0)
RBC: 3.71 MIL/uL — ABNORMAL LOW (ref 3.87–5.11)
WBC: 10.4 10*3/uL (ref 4.0–10.5)

## 2011-07-03 LAB — URINE CULTURE

## 2011-07-03 LAB — GLUCOSE, CAPILLARY: Glucose-Capillary: 182 mg/dL — ABNORMAL HIGH (ref 70–99)

## 2011-07-03 MED ORDER — VALPROATE SODIUM 500 MG/5ML IV SOLN
500.0000 mg | Freq: Once | INTRAVENOUS | Status: AC
  Administered 2011-07-03: 500 mg via INTRAVENOUS
  Filled 2011-07-03: qty 5

## 2011-07-03 MED ORDER — VALPROIC ACID 250 MG PO CAPS
250.0000 mg | ORAL_CAPSULE | Freq: Two times a day (BID) | ORAL | Status: DC
  Filled 2011-07-03: qty 1

## 2011-07-03 MED ORDER — VALPROIC ACID 250 MG PO CAPS
250.0000 mg | ORAL_CAPSULE | Freq: Two times a day (BID) | ORAL | Status: DC
  Administered 2011-07-03 – 2011-07-04 (×2): 250 mg via ORAL
  Filled 2011-07-03 (×3): qty 1

## 2011-07-03 MED ORDER — OXYCODONE-ACETAMINOPHEN 5-325 MG PO TABS
1.0000 | ORAL_TABLET | ORAL | Status: DC | PRN
  Administered 2011-07-03 – 2011-07-04 (×5): 1 via ORAL
  Filled 2011-07-03 (×6): qty 1

## 2011-07-03 MED ORDER — VALPROIC ACID 250 MG PO CAPS: 250.0000 mg | ORAL_CAPSULE | Freq: Two times a day (BID) | ORAL | Status: DC

## 2011-07-03 MED ORDER — LORAZEPAM 0.5 MG PO TABS: 0.5000 mg | ORAL_TABLET | Freq: Three times a day (TID) | ORAL | Status: DC

## 2011-07-03 MED ORDER — ALPRAZOLAM 1 MG PO TABS: 1.0000 mg | ORAL_TABLET | Freq: Three times a day (TID) | ORAL | Status: AC

## 2011-07-03 MED ORDER — AZITHROMYCIN 250 MG PO TABS: ORAL_TABLET | ORAL | Status: AC

## 2011-07-03 MED ORDER — DIVALPROEX SODIUM 125 MG PO CPSP
250.0000 mg | ORAL_CAPSULE | Freq: Two times a day (BID) | ORAL | Status: DC
  Filled 2011-07-03: qty 2

## 2011-07-03 NOTE — Procedures (Signed)
HISTORY:  60 years old woman referred for rule out seizures, per report.  MEDICATIONS:  Xanax, Neurontin, per report.  CONDITION OF RECORDING:  This 16-lead EEG was recorded with the patient in an awake, drowsy, and sleep states.  Background rhythm:  Background patterns in wakefulness were well-organized with a well-sustained posterior dominant rhythm of 10.5 Hz, symmetrical and reactive to eye opening and closing.  Drowsiness was associated with mild attenuation of voltage and slowing of frequencies.  Normal sleep patterns were seen. Abnormal potentials:  No epileptiform activity or focal slowing was noted.  ACTIVATION PROCEDURES:  Hyperventilation was not performed.  Photic stimulation did not activate tracing.  EKG:  Single channel EKG monitoring was unremarkable.  IMPRESSION:  This was a normal awake, drowsy, and sleep EEG.  A normal EEG does not rule out the clinical diagnosis of epilepsy.  If clinically warranted, a repeated extended EEG or ambulatory recording may be obtained for prolonged recording times, which may increase diagnostic yield.  Clinical correlation is suggested.          ______________________________ Carmell Austria, MD    [id number] D:  07/02/2011 18:43:24  T:  07/02/2011 23:02:53  Job #:  [id number]

## 2011-07-03 NOTE — Progress Notes (Signed)
Patient indicates at time of discharge that she can not afford to fill discharge medications.  Her refills will be active on xanax tomorrow (for Medicaid).  RN Case Mgr has located a company that can provide her a month of medications.  Unfortunately, they will not have depakene available until noon 12/12.   Will hold discharge at this time.  Pt will be reviewed for d/c in am.
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
## 2011-07-03 NOTE — Discharge Instructions (Signed)
STROKE/TIA DISCHARGE INSTRUCTIONS SMOKING Cigarette smoking nearly doubles your risk of having a stroke & is the single most alterable risk factor  If you smoke or have smoked in the last 12 months, you are advised to quit smoking for your health.  Most of the excess cardiovascular risk related to smoking disappears within a year of stopping.  Ask you doctor about anti-smoking medications  [place] Quit Line: 1-800-QUIT NOW  Free Smoking Cessation Classes ([phone]  CHOLESTEROL Know your levels; limit fat & cholesterol in your diet  Lipid Panel  No results found for this basename: chol, trig, hdl, cholhdl, vldl, ldlcalc      Many patients benefit from treatment even if their cholesterol is at goal.  Goal: Total Cholesterol (CHOL) less than 160  Goal:  Triglycerides (TRIG) less than 150  Goal:  HDL greater than 40  Goal:  LDL (LDLCALC) less than 100   BLOOD PRESSURE American Stroke Association blood pressure target is less that 120/80 mm/Hg  Your discharge blood pressure is:  BP: 132/55 mmHg  Monitor your blood pressure  Limit your salt and alcohol intake  Many individuals will require more than one medication for high blood pressure  DIABETES (A1c is a blood sugar average for last 3 months) Goal HGBA1c is under 7% (HBGA1c is blood sugar average for last 3 months)  Diabetes: {STROKE DC DIABETES:22357}    No results found for this basename: HGBA1C     Your HGBA1c can be lowered with medications, healthy diet, and exercise.  Check your blood sugar as directed by your physician  Call your physician if you experience unexplained or low blood sugars.  PHYSICAL ACTIVITY/REHABILITATION Goal is 30 minutes at least 4 days per week    {STROKE DC ACTIVITY/REHAB:22359}  Activity decreases your risk of heart attack and stroke and makes your heart stronger.  It helps control your weight and blood pressure; helps you relax and can improve your mood.  Participate in a regular exercise  program.  Talk with your doctor about the best form of exercise for you (dancing, walking, swimming, cycling).  DIET/WEIGHT Goal is to maintain a healthy weight  Your discharge diet is:   *** liquids Your height is:  Height: 5\' 6"  (167.6 cm) Your current weight is: Weight: 79.379 kg (175 lb) Your Body Mass Index (BMI) is:  BMI (Calculated): 28.3   Following the type of diet specifically designed for you will help prevent another stroke.  Your goal weight range is:  ***  Your goal Body Mass Index (BMI) is 19-24.  Healthy food habits can help reduce 3 risk factors for stroke:  High cholesterol, hypertension, and excess weight.  RESOURCES Stroke/Support Group:  Call [phone]   STROKE EDUCATION PROVIDED/REVIEWED AND GIVEN TO PATIENT Stroke warning signs and symptoms How to activate emergency medical system (call 911). Medications prescribed at discharge. Need for follow-up after discharge. Personal risk factors for stroke. Pneumonia vaccine given:   {STROKE DC YES/NO/DATE:22363} Flu vaccine given:   {STROKE DC YES/NO/DATE:22363} My questions have been answered, the writing is legible, and I understand these instructions.  I will adhere to these goals & educational materials that have been provided to me after my discharge from the hospital.

## 2011-07-03 NOTE — Progress Notes (Signed)
Physical Therapy Evaluation Patient Details Name: Tammy Armstrong MRN: [id number] DOB: September 08, 1950 Today's Date: 07/03/2011  Problem List:  Patient Active Problem List  Diagnoses  . Pontine hemorrhage  . Seizure    Past Medical History:  Past Medical History  Diagnosis Date  . Migraines   . Hypertension   . Diabetes mellitus   . Hepatitis C   . Anxiety   . Stroke    Past Surgical History:  Past Surgical History  Procedure Date  . Abdominal hysterectomy   . Cholecystectomy     PT Assessment/Plan/Recommendation PT Assessment Clinical Impression Statement: Pt is a 60 y/o female admitted with a seizure and pontine infarct.  Pt would benefit from a RW for d/c, but is modified independent with all mobility requiring no follow-up. PT Recommendation/Assessment: Patent does not need any further PT services PT Problem List: Pain Barriers to Discharge: None No Skilled PT: All education completed;Patient is modified independent with all activity/mobility PT Recommendation Follow Up Recommendations: None Equipment Recommended: Rolling walker with 5" wheels  PT Evaluation Precautions/Restrictions  Precautions Precautions: Fall Required Braces or Orthoses: No Restrictions Weight Bearing Restrictions: No Prior Functioning  Home Living Lives With: Alone Type of Home: Apartment Home Layout: One level Home Access: Level entry Home Adaptive Equipment: None Prior Function Level of Independence: Independent with basic ADLs;Independent with homemaking with ambulation;Independent with gait;Independent with transfers Cognition Cognition Arousal/Alertness: Awake/alert Overall Cognitive Status: Appears within functional limits for tasks assessed Sensation/Coordination Sensation Light Touch: Appears Intact Stereognosis: Not tested Hot/Cold: Not tested Proprioception: Not tested Coordination Gross Motor Movements are Fluid and Coordinated: Yes Fine Motor Movements are Fluid and  Coordinated: Not tested Extremity Assessment  RUE Assessment RUE Assessment: Within Functional Limits LUE Assessment LUE Assessment: Within Functional Limits RLE Assessment RLE Assessment: Within Functional Limits (4/5) LLE Assessment LLE Assessment: Within Functional Limits (5/5) Pain 2/10 headache.  Pt repositioned.  RN aware. Mobility (including Balance) Bed Mobility Bed Mobility: Yes Supine to Sit: 6: Modified independent (Device/Increase time) (2 trials.) Sit to Supine - Left: 6: Modified independent (Device/Increase time) Transfers Transfers: Yes Sit to Stand: 6: Modified independent (Device/Increase time);From bed (3 trials.) Stand to Sit: 6: Modified independent (Device/Increase time);To bed;To chair/3-in-1 (3 trials.) Ambulation/Gait Ambulation/Gait: Yes Ambulation/Gait Assistance: 5: Supervision (Progressed to modified independent.) Ambulation/Gait Assistance Details (indicate cue type and reason): Verbal cues for safe use of RW. Ambulation Distance (Feet): 180 Feet Assistive device: Rolling walker Gait Pattern: Decreased step length - right;Decreased step length - left Stairs: No Wheelchair Mobility Wheelchair Mobility: No  Posture/Postural Control Posture/Postural Control: No significant limitations Balance Balance Assessed: No End of Session PT - End of Session Equipment Utilized During Treatment: Gait belt Activity Tolerance: Patient tolerated treatment well Patient left: in chair;with call bell in reach Nurse Communication: Mobility status for transfers;Mobility status for ambulation General Behavior During Session: [facility] for tasks performed Cognition: [facility] for tasks performed  Cephus Shelling 07/03/2011, 11:07 AM  07/03/2011 Cephus Shelling, PT, DPT [phone]

## 2011-07-03 NOTE — Progress Notes (Signed)
Inpatient Diabetes Program Recommendations  AACE/ADA: New Consensus Statement on Inpatient Glycemic Control (2009)  Target Ranges:  Prepandial:   less than 140 mg/dL      Peak postprandial:   less than 180 mg/dL (1-2 hours)      Critically ill patients:  140 - 180 mg/dL   Reason for Visit: Elevated prandial glucose:  92, 227 mg/dL   Add Novolog 4 units TID with meals   HgbA1c pending,  Novolog correction added 12/10 and oral agents discontinued appropriately while in hospital setting.

## 2011-07-03 NOTE — Progress Notes (Signed)
UR Completed.  [name] Jane [phone] 07/03/2011  

## 2011-07-03 NOTE — Progress Notes (Signed)
eLink Physician-Brief Progress Note Patient Name: Tammy Armstrong DOB: Nov 25, 1950 MRN: [id number]  Date of Service  07/03/2011   HPI/Events of Note   Unchanged headache  eICU Interventions  perc   Intervention Category Major Interventions: OtherNelda Bucks. 07/03/2011, 6:23 AM

## 2011-07-03 NOTE — ED Provider Notes (Addendum)
I saw and evaluated the patient, reviewed the resident's note and I agree with the findings and plan, ekg interpretation (reviewed by me).  Intubated. Sedated in ED. RRR, no mrg. Lungs CTAB. PERRL. Neurology at bedside on arrival. Admitted to ICU.  CRITICAL CARE Performed by: Forbes Cellar   Total critical care time:  Critical care time was exclusive of separately billable procedures and treating other patients.  Critical care was necessary to treat or prevent imminent or life-threatening deterioration.  Critical care was time spent personally by me on the following activities: development of treatment plan with patient and/or surrogate as well as nursing, discussions with consultants, evaluation of patient's response to treatment, examination of patient, obtaining history from patient or surrogate, ordering and performing treatments and interventions, ordering and review of laboratory studies, ordering and review of radiographic studies, pulse oximetry and re-evaluation of patient's condition.     Forbes Cellar, MD 07/03/11 1742  Forbes Cellar, MD 07/03/11 1742

## 2011-07-04 ENCOUNTER — Encounter (HOSPITAL_COMMUNITY): Payer: Self-pay | Admitting: Acute Care

## 2011-07-04 DIAGNOSIS — J9690 Respiratory failure, unspecified, unspecified whether with hypoxia or hypercapnia: Secondary | ICD-10-CM

## 2011-07-04 DIAGNOSIS — R509 Fever, unspecified: Secondary | ICD-10-CM | POA: Diagnosis present

## 2011-07-04 DIAGNOSIS — E119 Type 2 diabetes mellitus without complications: Secondary | ICD-10-CM | POA: Diagnosis present

## 2011-07-04 DIAGNOSIS — F419 Anxiety disorder, unspecified: Secondary | ICD-10-CM | POA: Diagnosis present

## 2011-07-04 DIAGNOSIS — J449 Chronic obstructive pulmonary disease, unspecified: Secondary | ICD-10-CM | POA: Diagnosis present

## 2011-07-04 DIAGNOSIS — J411 Mucopurulent chronic bronchitis: Secondary | ICD-10-CM | POA: Diagnosis present

## 2011-07-04 HISTORY — DX: Chronic obstructive pulmonary disease, unspecified: J44.9

## 2011-07-04 HISTORY — DX: Respiratory failure, unspecified, unspecified whether with hypoxia or hypercapnia: J96.90

## 2011-07-04 LAB — GLUCOSE, CAPILLARY
Glucose-Capillary: 180 mg/dL — ABNORMAL HIGH (ref 70–99)
Glucose-Capillary: 119 mg/dL — ABNORMAL HIGH (ref 70–99)
Glucose-Capillary: 252 mg/dL — ABNORMAL HIGH (ref 70–99)

## 2011-07-04 MED ORDER — VALPROIC ACID 250 MG PO CAPS: 250.0000 mg | ORAL_CAPSULE | Freq: Two times a day (BID) | ORAL | Status: DC

## 2011-07-04 NOTE — Progress Notes (Signed)
OT Note:  Order received, chart reviewed.  Brief contact made with pat who is independent in ADL and mobility in this envionment.  No OT need identified.  Did not proceed with eval.  Signing off.   07/04/2011 Martie Round, OTR/L Pager: [phone]

## 2011-07-04 NOTE — Progress Notes (Signed)
Pt in no signs of acute distress. Stroke D/C and education provided. Patient had no further questions. Transported via EMS to home. KCrotts, Charity fundraiser
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
## 2011-07-04 NOTE — Progress Notes (Signed)
   CARE MANAGEMENT NOTE 07/04/2011  Patient:  Tammy Armstrong, Tammy Armstrong   Account Number:  [id number]  Date Initiated:  07/04/2011  Documentation initiated by:  Elisha Cooksey  Subjective/Objective Assessment:   PT ADM  WITH SEIZURES, RESPIRATORY FAILURE  LIKELY DUE TO XANAX WITHDRAWAL.     Action/Plan:   MET WITH PT TO DISCUSS DISCHARGE NEEDS.  PTA, PT LIVES ALONE IN AN APARTMENT.  SHE STATES SHE HAS NO FRIENDS AND FAMILY, AND IS HAVING TROUBLE WITH HER FINANCES CURRENTLY.   Anticipated DC Date:  07/04/2011   Anticipated DC Plan:  HOME/SELF CARE  In-house referral  Clinical Social Worker      DC Planning Services  Medication Assistance      Choice offered to / List presented to:     DME arranged  Levan Hurst      DME agency  [facility]        Status of service:  Completed, signed off Medicare Important Message given?   (If response is "NO", the following Medicare IM given date fields will be blank) Date Medicare IM given:   Date Additional Medicare IM given:    Discharge Disposition:  HOME/SELF CARE  Per UR Regulation:    Comments:  07/04/11 Kajal Scalici,RN,BSN [facility] DELIVERED ALL MEDS AS PROMISED.  PT FOR DISCHARGE HOME TODAY.  CSW TO ASSIST WITH TRANSPORTATION NEEDS. Phone #[phone]  07/03/11 Langford Carias,RN,BSN 1500 MET WITH PT TO DISCUSS DC NEEDS.  PT STATES SHE HAS NO MONEY TO GET NEW RX FILLED.  HAS 3 NEW RX: DEPAKENE, XANAX, AND ANTIBIOTIC.   PT IS ELIGIBLE FOR 3 DAYS WORTH OF MEDS THROUGH MAIN PHARMACY, BUT PT STATES SHE HAS NO $ TO GET MEDS FILLED AFTER THAT.  [facility] [phone], PHARMACIST STATES THEY WILL BE ABLE TO HAVE ALL MEDS AVAILABLE AND READY BY WED AROUND 12.  THEY HAVE TO ORDER DEPAKENE.  THEY CAN DELIVER MEDS TO HOSPITAL TOMORROW.  FAXED RX  AND DEMOGRAPHICS TO [facility], NOTIFIED B. OLLIS, PA OF ABOVE. Phone #[phone]

## 2011-07-04 NOTE — Progress Notes (Signed)
CSW arranged PTAR for pt to return home.  CSW signing off.  Baxter Flattery, MSW [phone]

## 2011-07-08 LAB — CULTURE, BLOOD (ROUTINE X 2): Culture  Setup Time: 201212101523

## 2011-08-07 DIAGNOSIS — F1920 Other psychoactive substance dependence, uncomplicated: Secondary | ICD-10-CM | POA: Insufficient documentation

## 2011-08-23 ENCOUNTER — Ambulatory Visit: Payer: Medicare Other | Admitting: Cardiovascular Disease

## 2011-09-02 LAB — BASIC METABOLIC PANEL
Anion Gap: 9 (ref 7–16)
Chloride: 105 mmol/L (ref 98–107)
Co2: 25 mmol/L (ref 21–32)
Creatinine: 0.71 mg/dL (ref 0.60–1.30)
Potassium: 3.6 mmol/L (ref 3.5–5.1)

## 2011-09-02 LAB — DRUG SCREEN, URINE
Amphetamines, Ur Screen: NEGATIVE (ref ?–1000)
Barbiturates, Ur Screen: NEGATIVE (ref ?–200)
Benzodiazepine, Ur Scrn: POSITIVE (ref ?–200)
MDMA (Ecstasy)Ur Screen: POSITIVE (ref ?–500)
Methadone, Ur Screen: NEGATIVE (ref ?–300)
Opiate, Ur Screen: NEGATIVE (ref ?–300)
Phencyclidine (PCP) Ur S: NEGATIVE (ref ?–25)
Tricyclic, Ur Screen: NEGATIVE (ref ?–1000)

## 2011-09-02 LAB — URINALYSIS, COMPLETE
Blood: NEGATIVE
Ketone: NEGATIVE
Leukocyte Esterase: NEGATIVE
Nitrite: NEGATIVE
Specific Gravity: 1.006 (ref 1.003–1.030)
Squamous Epithelial: 2
WBC UR: 1 /HPF (ref 0–5)

## 2011-09-02 LAB — TROPONIN I: Troponin-I: <0.02 ng/mL

## 2011-09-02 LAB — CBC
Platelet: 164 10*3/uL (ref 150–440)
RDW: 13.2 % (ref 11.5–14.5)

## 2011-09-02 LAB — CK TOTAL AND CKMB (NOT AT ARMC): CK, Total: 168 U/L (ref 21–215)

## 2011-09-03 ENCOUNTER — Observation Stay: Payer: Self-pay | Admitting: Internal Medicine

## 2011-09-03 DIAGNOSIS — R079 Chest pain, unspecified: Secondary | ICD-10-CM

## 2011-09-03 LAB — CK TOTAL AND CKMB (NOT AT ARMC): CK, Total: 114 U/L (ref 21–215)

## 2011-09-03 LAB — TROPONIN I: Troponin-I: <0.02 ng/mL

## 2011-09-12 ENCOUNTER — Encounter: Payer: Self-pay | Admitting: *Deleted

## 2011-10-25 ENCOUNTER — Ambulatory Visit: Payer: Medicare Other | Admitting: Family Medicine

## 2011-11-15 ENCOUNTER — Emergency Department: Payer: Self-pay | Admitting: Emergency Medicine

## 2011-11-15 LAB — COMPREHENSIVE METABOLIC PANEL
Albumin: 3 g/dL — ABNORMAL LOW (ref 3.4–5.0)
Alkaline Phosphatase: 47 U/L — ABNORMAL LOW (ref 50–136)
Anion Gap: 8 (ref 7–16)
Bilirubin,Total: 0.2 mg/dL (ref 0.2–1.0)
Calcium, Total: 8.2 mg/dL — ABNORMAL LOW (ref 8.5–10.1)
Co2: 27 mmol/L (ref 21–32)
EGFR (African American): >60
EGFR (Non-African Amer.): >60
Osmolality: 286 (ref 275–301)
Potassium: 4.2 mmol/L (ref 3.5–5.1)
SGOT(AST): 35 U/L (ref 15–37)
SGPT (ALT): 38 U/L
Sodium: 142 mmol/L (ref 136–145)
Total Protein: 6.4 g/dL (ref 6.4–8.2)

## 2011-11-15 LAB — CBC
HGB: 12.9 g/dL (ref 12.0–16.0)
MCHC: 34.3 g/dL (ref 32.0–36.0)
Platelet: 163 10*3/uL (ref 150–440)
RDW: 13.1 % (ref 11.5–14.5)
WBC: 8.9 10*3/uL (ref 3.6–11.0)

## 2011-11-15 LAB — TROPONIN I: Troponin-I: <0.02 ng/mL

## 2012-02-12 ENCOUNTER — Emergency Department: Payer: Self-pay | Admitting: Emergency Medicine

## 2012-02-12 LAB — URINALYSIS, COMPLETE
Bacteria: NONE SEEN
Bilirubin,UR: NEGATIVE
Glucose,UR: NEGATIVE mg/dL (ref 0–75)
Protein: NEGATIVE
RBC,UR: 1 /HPF (ref 0–5)
Specific Gravity: 1.011 (ref 1.003–1.030)
Squamous Epithelial: 20
WBC UR: 3 /HPF (ref 0–5)

## 2012-02-12 LAB — BASIC METABOLIC PANEL
BUN: 12 mg/dL (ref 7–18)
Calcium, Total: 8.8 mg/dL (ref 8.5–10.1)
Creatinine: 0.89 mg/dL (ref 0.60–1.30)
EGFR (Non-African Amer.): >60
Glucose: 156 mg/dL — ABNORMAL HIGH (ref 65–99)
Potassium: 4 mmol/L (ref 3.5–5.1)
Sodium: 136 mmol/L (ref 136–145)

## 2012-02-12 LAB — CBC
Platelet: 186 10*3/uL (ref 150–440)
RDW: 13.2 % (ref 11.5–14.5)
WBC: 12.1 10*3/uL — ABNORMAL HIGH (ref 3.6–11.0)

## 2012-02-12 LAB — TROPONIN I: Troponin-I: <0.02 ng/mL

## 2012-02-12 LAB — CK TOTAL AND CKMB (NOT AT ARMC): CK, Total: 117 U/L (ref 21–215)

## 2013-01-14 DIAGNOSIS — Z8619 Personal history of other infectious and parasitic diseases: Secondary | ICD-10-CM | POA: Insufficient documentation

## 2013-08-11 ENCOUNTER — Other Ambulatory Visit: Payer: Self-pay | Admitting: Neurosurgery

## 2013-08-11 DIAGNOSIS — M5126 Other intervertebral disc displacement, lumbar region: Secondary | ICD-10-CM

## 2013-08-11 DIAGNOSIS — R29818 Other symptoms and signs involving the nervous system: Secondary | ICD-10-CM

## 2013-08-21 ENCOUNTER — Other Ambulatory Visit: Payer: Medicare Other

## 2013-08-24 ENCOUNTER — Other Ambulatory Visit: Payer: Medicare Other

## 2013-10-01 DIAGNOSIS — F1210 Cannabis abuse, uncomplicated: Secondary | ICD-10-CM | POA: Insufficient documentation

## 2014-01-25 DIAGNOSIS — Z9229 Personal history of other drug therapy: Secondary | ICD-10-CM | POA: Insufficient documentation

## 2014-07-03 ENCOUNTER — Emergency Department: Payer: Self-pay | Admitting: Emergency Medicine

## 2014-07-03 LAB — URINALYSIS, COMPLETE
Bilirubin,UR: NEGATIVE
Blood: NEGATIVE
Glucose,UR: 50 mg/dL (ref 0–75)
Ketone: NEGATIVE
Nitrite: NEGATIVE
PH: 5 (ref 4.5–8.0)
PROTEIN: NEGATIVE
RBC, UR: NONE SEEN /HPF (ref 0–5)
Specific Gravity: 1.023 (ref 1.003–1.030)

## 2014-07-10 ENCOUNTER — Inpatient Hospital Stay: Payer: Self-pay | Admitting: Specialist

## 2014-07-10 LAB — DRUG SCREEN, URINE
Amphetamines, Ur Screen: NEGATIVE (ref ?–1000)
BENZODIAZEPINE, UR SCRN: POSITIVE (ref ?–200)
Barbiturates, Ur Screen: NEGATIVE (ref ?–200)
COCAINE METABOLITE, UR ~~LOC~~: NEGATIVE (ref ?–300)
Cannabinoid 50 Ng, Ur ~~LOC~~: POSITIVE (ref ?–50)
MDMA (Ecstasy)Ur Screen: NEGATIVE (ref ?–500)
METHADONE, UR SCREEN: NEGATIVE (ref ?–300)
Opiate, Ur Screen: POSITIVE (ref ?–300)
Phencyclidine (PCP) Ur S: NEGATIVE (ref ?–25)
TRICYCLIC, UR SCREEN: NEGATIVE (ref ?–1000)

## 2014-07-10 LAB — HEPATIC FUNCTION PANEL A (ARMC)
ALBUMIN: 3.1 g/dL — AB (ref 3.4–5.0)
ALT: 28 U/L
Alkaline Phosphatase: 43 U/L — ABNORMAL LOW
Bilirubin,Total: 0.3 mg/dL (ref 0.2–1.0)
SGOT(AST): 27 U/L (ref 15–37)
Total Protein: 6.9 g/dL (ref 6.4–8.2)

## 2014-07-10 LAB — URINALYSIS, COMPLETE
BACTERIA: NONE SEEN
BILIRUBIN, UR: NEGATIVE
Blood: NEGATIVE
Glucose,UR: NEGATIVE mg/dL (ref 0–75)
Hyaline Cast: 3
Leukocyte Esterase: NEGATIVE
NITRITE: NEGATIVE
Ph: 5 (ref 4.5–8.0)
Protein: 30
RBC,UR: 3 /HPF (ref 0–5)
SPECIFIC GRAVITY: 1.025 (ref 1.003–1.030)
Squamous Epithelial: 1
WBC UR: 2 /HPF (ref 0–5)

## 2014-07-10 LAB — AMMONIA: Ammonia, Plasma: 53 mcmol/L — ABNORMAL HIGH (ref 11–32)

## 2014-07-10 LAB — CBC
HCT: 42 % (ref 35.0–47.0)
HGB: 14.6 g/dL (ref 12.0–16.0)
MCH: 33.5 pg (ref 26.0–34.0)
MCHC: 34.9 g/dL (ref 32.0–36.0)
MCV: 96 fL (ref 80–100)
Platelet: 160 10*3/uL (ref 150–440)
RBC: 4.37 10*6/uL (ref 3.80–5.20)
RDW: 13.2 % (ref 11.5–14.5)
WBC: 7.9 10*3/uL (ref 3.6–11.0)

## 2014-07-10 LAB — BASIC METABOLIC PANEL
Anion Gap: 8 (ref 7–16)
BUN: 15 mg/dL (ref 7–18)
Calcium, Total: 8.3 mg/dL — ABNORMAL LOW (ref 8.5–10.1)
Chloride: 102 mmol/L (ref 98–107)
Co2: 28 mmol/L (ref 21–32)
Creatinine: 1.02 mg/dL (ref 0.60–1.30)
EGFR (African American): >60
EGFR (Non-African Amer.): 58 — ABNORMAL LOW
Glucose: 150 mg/dL — ABNORMAL HIGH (ref 65–99)
Osmolality: 279 (ref 275–301)
Potassium: 4 mmol/L (ref 3.5–5.1)
Sodium: 138 mmol/L (ref 136–145)

## 2014-07-10 LAB — VALPROIC ACID LEVEL: Valproic Acid: 72 ug/mL

## 2014-07-11 LAB — BASIC METABOLIC PANEL
Anion Gap: 7 (ref 7–16)
BUN: 17 mg/dL (ref 7–18)
CO2: 29 mmol/L (ref 21–32)
Calcium, Total: 8.5 mg/dL (ref 8.5–10.1)
Chloride: 104 mmol/L (ref 98–107)
Creatinine: 0.88 mg/dL (ref 0.60–1.30)
EGFR (African American): >60
EGFR (Non-African Amer.): >60
GLUCOSE: 81 mg/dL (ref 65–99)
Osmolality: 280 (ref 275–301)
POTASSIUM: 3.5 mmol/L (ref 3.5–5.1)
Sodium: 140 mmol/L (ref 136–145)

## 2014-07-11 LAB — CBC WITH DIFFERENTIAL/PLATELET
Comment - H1-Com1: NORMAL
Eosinophil: 2 %
HCT: 41.5 % (ref 35.0–47.0)
HGB: 14.3 g/dL (ref 12.0–16.0)
Lymphocytes: 53 %
MCH: 33.5 pg (ref 26.0–34.0)
MCHC: 34.5 g/dL (ref 32.0–36.0)
MCV: 97 fL (ref 80–100)
MONOS PCT: 7 %
Platelet: 153 10*3/uL (ref 150–440)
RBC: 4.27 10*6/uL (ref 3.80–5.20)
RDW: 13.5 % (ref 11.5–14.5)
SEGMENTED NEUTROPHILS: 26 %
Variant Lymphocyte - H1-Rlymph: 12 %
WBC: 7.7 10*3/uL (ref 3.6–11.0)

## 2014-07-11 LAB — PROTIME-INR
INR: 1
Prothrombin Time: 13.4 secs (ref 11.5–14.7)

## 2014-07-11 LAB — LIPID PANEL
Cholesterol: 118 mg/dL (ref 0–200)
HDL Cholesterol: 28 mg/dL — ABNORMAL LOW (ref 40–60)
Ldl Cholesterol, Calc: 56 mg/dL (ref 0–100)
Triglycerides: 169 mg/dL (ref 0–200)
VLDL Cholesterol, Calc: 34 mg/dL (ref 5–40)

## 2014-07-11 LAB — TSH: Thyroid Stimulating Horm: 4.39 u[IU]/mL

## 2014-07-11 LAB — AMMONIA: Ammonia, Plasma: 25 mcmol/L (ref 11–32)

## 2014-07-12 LAB — AMMONIA: AMMONIA, PLASMA: 53 umol/L — AB (ref 11–32)

## 2014-07-13 ENCOUNTER — Inpatient Hospital Stay: Payer: Self-pay | Admitting: Psychiatry

## 2014-07-14 LAB — AMMONIA: Ammonia, Plasma: 20 mcmol/L (ref 11–32)

## 2014-09-29 ENCOUNTER — Emergency Department: Payer: Self-pay | Admitting: Emergency Medicine

## 2014-11-13 NOTE — Consult Note (Signed)
Psychiatry: Patient seen and discussed with nurses. Chart reviewed. Patient today is awake and alert and interactive. She is convinced that her roommate "Tammy Armstrong" is trying to kill her. States that is she goes home she will definately die.  is awake and alert and has good eye contact. Speach normal prosady. Affect anxious. Thoughts delusional and confused. Indicates she is clearly hearing voices. Not reporting suicidal plan. But sure people are trying to kill her.  do not think this is delirium but is psychosis as part of bipolar disorder. Spoke with Dr Verdell Carmine. Patient medically stable for transfer. We should have a bed. Intake nurse will see patient and then we can arrange transfer and at that point I will do a more complete updated note.  Bipolar disorder mixed with psychotic features/Schizoaffective disorder. Patient indicated to me that she would sign voluntary. If not will initiate commitment.  Electronic Signatures: Dak Szumski, Madie Reno (MD)  (Signed on 22-Dec-15 12:30)  Authored  Last Updated: 22-Dec-15 12:30 by Gonzella Lex (MD)

## 2014-11-13 NOTE — Consult Note (Signed)
PATIENT NAME:  Tammy Armstrong, Tammy Armstrong MR#:  [id number] DATE OF BIRTH:  29-Apr-1951  DATE OF CONSULTATION:  07/13/2014  REFERRING PHYSICIAN:   CONSULTING PHYSICIAN:  Gonzella Lex, MD  IDENTIFYING INFORMATION AND REASON FOR CONSULTATION: A 64 year old woman with a history of bipolar disorder in the hospital currently for confusion.   CHIEF COMPLAINT: "They're going to kill me."   HISTORY OF PRESENT ILLNESS: Information obtained from the patient, from the chart, and from an interview with the patient's friend with whom she lives. Friend reports that the patient has been getting more strange with her behavior and seeming to be more psychotic for a couple of months. They were not able to identify any particular stressor for this. As far as is known the patient has continued on her usual medication. On my interview with the patient today I found her to be wide awake, but agitated, absolutely delusional, and believing that someone was trying to kill her. Said that they were trying to poison her. She showed me Kleenexes that she claimed were poisoned. Slept poorly last night. Does not feel comfortable going home. The patient did have a decrease in her Xanax when she came into the hospital which was done to try and see if the Xanax was making her oversedated. Unclear if that is related. She also has been found to have an elevated ammonia level. This could possibly be related to her Depakote. Also unknown how this might be related.   PAST PSYCHIATRIC HISTORY: Has had psychiatric hospitalizations in the past, but not in many years. Past history of bipolar disorder. Normally followed by Dr. Kasandra Knudsen in the community. Normally maintained on Depakote and Xanax and Cymbalta and trazodone.   FAMILY HISTORY: Unknown.   SOCIAL HISTORY: She lives with some adult friends. Family not extremely involved.   MENTAL STATUS EXAMINATION: The patient was neatly groomed and awake and alert, looks her stated age, engaged appropriately in the  interview. Eye contact intermittent. Looking around the room frequently as though paranoid. Speech was a little bit pressured and increased in rate. Affect frightened and anxious. Mood stated as bad. Thoughts are disorganized and focused entirely on her belief that someone is trying to kill her. Makes it very clear to me that she is having auditory hallucinations. Denies suicidal or homicidal ideation. Judgment and insight poor. She is alert and oriented x 4 and does not appear to have a waxing or waning mental status. Does not really appear to be delirious.   LABORATORY RESULTS: The last check of her ammonia level was on the 21st and it still was 53 despite her having gotten lactulose. I have ordered a new one. No other new laboratories right now.   ASSESSMENT: A 64 year old woman who presented initially with what sounds like it might have been a delirium. On my evaluation today she does not appear to be delirious, but appears to be psychotic with manic features. Differential diagnosis would include an exacerbation of bipolar disorder or schizoaffective disorder with a psychotic presentation, effect of ammonia possibly due to the Depakote, and Xanax withdrawal. The patient has a past history of supposedly having a seizure disorder, but the information I get from the friend is that the seizure only happened in the context of cold [place] stopping her Xanax. There was only the 1 seizure. This would suggest to me that perhaps she does not need the Depakote and something else could be switched out.   TREATMENT PLAN: I have simply left all of the  medications in play for the moment. Included the lactulose. Check ammonia level tomorrow. Multiple precautions including suicide, elopement, close, seizure, and fall precautions.   DIAGNOSIS PRINCIPAL AND PRIMARY:  AXIS I: Bipolar disorder, mixed, psychotic.   SECONDARY DIAGNOSES:  AXIS I: No further.   AXIS II: Deferred.   AXIS III:  1.  Elevated ammonia of  unclear etiology.  2.  Possible history of seizure disorder.  3.  Diabetes.    ____________________________ Gonzella Lex, MD jtc:bu D: 07/13/2014 16:38:01 ET T: 07/13/2014 17:03:44 ET JOB#: [id number]  cc: Gonzella Lex, MD, <Dictator> Gonzella Lex MD ELECTRONICALLY SIGNED 07/14/2014 0:42

## 2014-11-13 NOTE — Consult Note (Signed)
PATIENT NAME:  Tammy Armstrong, Tammy Armstrong MR#:  [id number] DATE OF BIRTH:  Jun 28, 1951  DATE OF CONSULTATION:  07/11/2014  REFERRING PHYSICIAN:   CONSULTING PHYSICIAN:  Sheyenne Konz K. Franchot Mimes, MD  PLACE OF DICTATION: [place].  AGE: 64 years.  SEX: Female.  RACE: White.  SUBJECTIVE: The patient was seen in consultation in room number [address] [place], [place]. The patient is a 64 year old white female. She stated that she is not employed and she is not married and lives at home in, [place]. Very poor historian, history given not reliable. According to information obtained from the staff, the patient has been living with friends who brought her here for confusion. The patient has been very combative, agitated and needed constant supervision and constant redirection, very labile mood and labile behavior, to the extent that one time she is very agitated and cursing the staff and the next time she apologizes and says sorry.   MENTAL STATUS: The patient is alert but she did not know where she was except that she stated this was [place]. She could not name that she was in a hospital, very agitated with bizarre behavior. She was trying to walk away and stated that she is going home and many staff members had to come and hold her down. She has been ringing the bell many times which she did not mean and is not aware of her behavior. She denied feeling depressed. She could not express herself. She is disoriented to place, person and time. Regarding judgment, when she was asked what she would do with fire, she reported "I don't do nothing, I don't do anything." She could not count money. Her insight and judgment impaired.  IMPRESSION: Dementia, to be ruled out. History of mental illness with confusion and the patient is on Cymbalta and this needs to be explored.  RECOMMENDATION: Ativan 1 mg p.o. IM q. 4 hours p.r.n. for agitation. Haldol 5 mg p.o. IM q. 6 hours p.r.n. for agitation to help her calm  down. Social services and staff are to contact friends and resources to find out further details so appropriate help can be given to the patient.   ____________________________ Wallace Cullens. Franchot Mimes, MD skc:TT D: 07/11/2014 14:45:52 ET T: 07/11/2014 19:22:42 ET JOB#: [id number]  cc: Arlyn Leak K. Franchot Mimes, MD, <Dictator> Dewain Penning MD ELECTRONICALLY SIGNED 07/13/2014 9:08

## 2014-11-13 NOTE — H&P (Signed)
PATIENT NAME:  LOMA, DUBUQUE MR#:  [id number] DATE OF BIRTH:  [birth year]  DATE OF ADMISSION:  07/13/2014  INITIAL PSYCHIATRIC ASSESSMENT  IDENTIFYING INFORMATION: The patient is a 64 year old divorced Caucasian female from [place], [place].   CHIEF COMPLAINT: "I have some problems with my back."  HISTORY OF PRESENT ILLNESS: This patient was originally admitted to the hospitalist service on December 19 after she was brought in to the Emergency Department by her friends. At that time, according to the patient's friend, the patient was taking Norco. According to the patient's friend, the patient was given 2 tablets of Norco and then she started to become confused so they stopped the Norco, but the patient continued to be confused. They reported that she had taken several cigarettes and started lighting them all together. During examination at arrival, the patient was disoriented to place and time. She was admitted and during the medical workup it was found that the patient had an elevated ammonia level and therefore she was started on lactulose. It was also found that the patient's urine toxicology was positive for cannabis, benzodiazepines, and opiates. Head CT was negative and a UA was negative. Apparently this patient has some history of psychiatric problems: depression, panic disorder, and bipolar disorder listed as past diagnoses. As she was confused, psychiatry was consulted and she ended up being transferred to our unit as it was thought that the confusion was secondary to bipolar disorder. Today, I interviewed the patient. She was disoriented in time and place. She stated that she thinks she has dementia. The patient was not able to provide much information. She just requested to be discharged. She wanted to go home with her friends. I attempted to contact the patient's friends for collateral information; however, I was only able to leave a message on the voicemail. Today I reviewed her medical  records. It looks like she was here in 2013. At that time she was seen for chest pain. Her discharge diagnosis included a possibility of suspected opiate abuse.   PAST PSYCHIATRIC HISTORY: Again, the records state that the patient has been diagnosed with depression, anxiety, bipolar, and panic attacks.  PSYCHIATRIC MEDICATIONS PRIOR TO ADMISSION: Included trazodone 100 mg at bedtime, Cymbalta 60 mg p.o. q. daily, and alprazolam 1 mg 4 times a day.   PAST MEDICAL HISTORY: Positive for GERD, diabetes, hypertension, hyperlipidemia, COPD, hepatitis C, migraines.  MEDICATIONS PRIOR TO ADMISSION: Valproic acid 250 mg p.o. q. daily and 500 mg at bedtime; simvastatin 20 mg daily; ProAir 2 puffs q. 4 hours as needed for shortness of breath; omeprazole 20 mg b.i.d.; Norco 5/325, 1 tab every 4 hours; metoprolol 50 mg q. day; metformin 500 mg b.i.d.; glipizide XL 5 mg p.o. q. daily; lisinopril 10 mg p.o. q. daily; gabapentin 300 mg q.i.d.   FAMILY HISTORY: Unknown. Unable to obtain at this time, as the patient is confused.   SOCIAL HISTORY: Not much is known other than the patient lives with friends in [place]. She states she is divorced. No other information is known at this point, and the patient is unable to provide this information at this time.   ALLERGIES: No known drug allergies.   REVIEW OF SYSTEMS: The patient denies all symptomatology but confusion. The rest of the 10 review of systems is negative.   MENTAL STATUS EXAMINATION: The patient is a 64 year old Caucasian female who appears older than her stated age. She is walking with a walker. She has multiple dental pieces missing. Behavior  was irritable. Eye contact was intense. Speech monotone, not spontaneous. Thought process concrete, vague. Thought content negative for suicidality, homicidality. Perception negative for psychosis. Mood irritable. Affect congruent. Insight and judgment impaired. Cognitive examination she is alert. She is oriented  in person. She is not oriented in place or time. She was confused. She only had personal awareness of her situation. She reported being admitted for pain and then admitted for memory issues. Fund of knowledge at this point: Unable to test. Attention and concentration impaired. Patient was unable to tell me the month of the year backward. She did want to even start. She said she could not.  PHYSICAL EXAMINATION: MUSCULOSKELETAL: The patient has normal muscular tone. No evidence of involuntary movements. Her gait is slow and unbalanced and is in need of the use of a walker.  VITAL SIGNS: Blood pressure 139/76, respirations 20, pulse 98, temperature 98.2.   LABORATORY RESULTS: BUN 17, creatinine 0.88, sodium 140, potassium 3.5, calcium 8.5. Lipid panel was basically within normal limits, only shows decreased HDL at 28. Ammonia level today was 23; at admission on December 19 it was 52. AST is 27, ALT is 28. TSH is 4.39. Valproic acid is 72. Urine toxicology positive for benzodiazepines, positive for cannabis, positive for opiates. WBC is within normal limits. INR 1, prothrombin 13.4. UA is clear and her EKG is within normal limits.  DIAGNOSES: 1.  Medication-induced delirium, bipolar disorder by history. Evaluate for dementia. 2.  Diabetes.  3.  Hypertension.  4.  Hyperlipidemia.  5.  Chronic obstructive pulmonary disease.  6.  Gastroesophageal reflux disease.  7.  Hepatitis C.  8.  Migraines.  9.  Tobacco use disorder, severe.  10.  Cannabis use disorder, moderate.  11.  Possible history of opiate abuse.  ASSESSMENT: This patient is a 64 year old Caucasian female admitted with confusion to the medical floor which started after patient had received a dose of Norco. Per records it appears that the patient may have a history of abusing narcotics and her urine toxicology is positive for cannabis. This patient is being prescribed with high doses of opiates and high doses of benzodiazepines in the  outpatient setting, and either one of these 2 medications or withdrawal of these 2 substances could have triggered delirium.   PLAN: For delirium, I will start the patient on haloperidol 0.5 mg p.o. 3 times a day. For insomnia, the patient will be started on trazodone 100 mg p.o. at bedtime. For anxiety, I will discontinue the alprazolam, as in this case substance abuse is suspected and also because withdrawal of alprazolam can easily trigger delirium in a patient, so we are using long-acting benzodiazepines in order to taper her off benzodiazepines. Today she will receive clonazepam 0.5 mg q. 8 hours. For bipolar disorder, at this point in time no collateral information has been obtained. This needs to be evaluated further. At this point in time, no treatment will be given specifically for bipolar disorder. However, it looks like the patient has been taking Cymbalta and she will be continued on 30 mg p.o. q. daily. For diabetes, she will continue glipizide XL 5 mg daily and metformin 500 mg p.o. b.i.d. For hypertension, she will receive lisinopril 20 mg p.o. q. daily and metoprolol 50 mg p.o. q. daily. For hyperammonemia, she will be continued on lactulose 30 mL p.o. b.i.d. For COPD, she will be continued on albuterol 2 puffs q. 4 hours p.r.n. as needed. For hypercholesterolemia, she will be continued on simvastatin 20 mg  p.o. q. daily. This patient is unclear whether or not she has a diagnosis of chronic pain; however, I highly suspect it, as she is prescribed with gabapentin, Norco, and Cymbalta. For now she will be continued on the gabapentin and I will decrease the dose to 300 mg 3 times a day in order to simplify her regimen instead of 300 mg 4 times a day.  For GERD she will receive pantoprazole 40 mg daily. For seizure disorder, also it is unclear as to whether or not she has a true seizure disorder. Prior to admission, she was taking Depakote; however, due to hyperammonemia this medication has been  discontinued. For right now, the patient will be maintained on seizure precautions and she will be continued on the gabapentin. Patient is also on clonazepam. These 2 medications can decrease the risk for seizures until at least I can get collateral information.  PRECAUTIONS: Fall and seizures.   DISCHARGE DISPOSITION: Stable. The patient will be discharged back to her friend's home in [place], [place].   ____________________________ Hildred Priest, MD ahg:ST D: 07/14/2014 13:46:33 ET T: 07/14/2014 14:21:07 ET JOB#: [id number]  cc: Hildred Priest, MD, <Dictator> Rhodia Albright MD ELECTRONICALLY SIGNED 07/14/2014 16:01

## 2014-11-13 NOTE — Consult Note (Signed)
PATIENT NAME:  Tammy Armstrong, FOOTE MR#:  [id number] DATE OF BIRTH:  August 11, 1950  DATE OF CONSULTATION:  07/12/2014  REFERRING PHYSICIAN:   CONSULTING PHYSICIAN:  Gonzella Lex, MD  IDENTIFYING INFORMATION AND REASON FOR CONSULTATION: A 64 year old woman with a somewhat unclear past psychiatric history. Consult is for medical decision-making.   HISTORY OF PRESENT ILLNESS: Information obtained from the patient and the chart. The patient was seen by Dr. Franchot Mimes over the weekend for consultation which was phrased as targeting bipolar disorder with confusion and agitation. Dr. Franchot Mimes thought that the patient seemed to be confused and probably delirious and prescribed some p.r.n. medication. There is a new consult today that instead says evaluate for medical decision-making capacity. On interview today, the patient was calm and alert and interactive. She said that her mood was feeling fine. Denied feeling depressed. Denied any suicidal ideation. Denied any hallucinations. She did say that she was aware that she was getting demented and having trouble with her memory. She could not really describe for me at all why she was here in the hospital or the events that lead to her being in the hospital, nor could she give me any kind of clear description really of her home life or of the medicines that she takes. I see that earlier today she was given some Haldol in the morning and some Ativan later in the day. I am not sure if her behavior was markedly different at that point. I see that yesterday in Dr. Nyra Market note, she was described as having been agitated and trying to leave the hospital. Today, the patient just tells me that she would like to go home, but is not indicating any wish to abruptly get up and leave. She has been treated with Cymbalta 60 mg a day and Depakote a total of 750 mg a day since coming into the hospital. Not entirely clear to me whether these were outpatient medicines or if so exactly what the  Depakote was for. The patient is not unable to give me any enlightenment on that. She has had a consistently elevated ammonia level whose significance is unclear.   PAST PSYCHIATRIC HISTORY: In the chart we have, I do not see any evidence of her having a psychiatric evaluation before this hospital stay. She has been described in the past as having problems with anxiety and depression. It looks like the Cymbalta at varying doses have been present for a while. It also looks like she has been on standing doses of Xanax for quite a while. The most recent dose of Xanax  is 1 mg 4 times a day prescribed by Dr. Kasandra Knudsen. This is quite a bit more than it looks like she was taking a couple of years ago. The patient tells me that she does have a past history of a suicide attempt, but cannot remember how long ago it was or describe anything about it. She tells me she does not think she has ever been in a psychiatric hospital and she describes her past history as being panic attacks.   SOCIAL HISTORY: The patient tells me that she lives with 2 girls whose relationship to her, she cannot explain. She cannot tell me the names of them either. It says the chart that she was brought here by some friends. The exact relationship is unclear. She tells me that she lives in a double wide trailer out in [facility]. Indicates that family are not really involved with her.   PAST MEDICAL  HISTORY:  She is reported on some occasions, as having had seizures in the past, the details of that are unclear. Seems to have diabetes which has been controlled with oral medication, high blood pressure, gastric reflux, dyslipidemia in the past. Currently, is having confusion, whose etiology is still unclear.   SUBSTANCE ABUSE HISTORY: The patient denies any history of alcohol or drug problems.   FAMILY HISTORY: Denies any known family history.   CURRENT MEDICATIONS: Here in the hospital, she is taking albuterol ipratropium inhaler 1 puff 4 times a  day, Xanax 0.25 mg 3 times a day, duloxetine 60 mg a day, glipizide XL 5 mg in the morning, lactulose 30 mL twice a day, lisinopril 10 mg a day, metformin 500 mg twice a day, metoprolol 50 mg in the morning, pantoprazole 40 mg a day, simvastatin 20 mg at night, Depakote 250 mg in the morning and 500 mg at night, Haldol given p.r.n. Ativan given p.r.n.   ALLERGIES: IMITREX, MORPHINE AND TORADOL .   MENTAL STATUS EXAMINATION: Adequately groomed woman, looks her stated age, polite and appropriately interactive. Good eye contact. Normal psychomotor activity. Speech normal rate, tone and volume. Affect is blunted, a little confused. Mood is stated as being alright. Thoughts are a little disorganized and rambling. She frequently will answer questions complete nonsequiturs as though she just did not hear the question at all. She believes that she is in some kind of "facility for old people." She is unable to tell me the year. She is able to repeat 3 words remembers none of them at 3 minutes. Not able to spell world backwards at all. Denies suicidal or homicidal ideation. Denies hallucinations. Does not appear to be responding to internal stimuli.   LABORATORY RESULTS: Ammonia as persistently elevated today, it is still 53. Admission labs included the elevated ammonia which was 53 when she came in, low alkaline phosphatase of  43, rest of the liver enzymes normal. Glucose elevated at 150, calcium low at 8.3. Drug screen positive for opiates, Cannibas and benzodiazepines. Head CT, no acute intracranial abnormalities, has a calcified left frontal lesion consistent with an osteoma or meningioma, has a remote left occipital infarct and chronic brainstem calcification.   VITAL SIGNS: Blood pressure currently 139/82, respirations 20, pulse 63, temperature 99.   ASSESSMENT: A 64 year old woman with unclear past mental history, possibly anxiety and depression. Yesterday, it sounds like she was pretty confused. Today, she  is much less agitated, but still has signs of dementia and confusion. I would guess that the combination of the higher dose of Xanax and the narcotics probably had a big part to play in her confusion. I am unclear about whether the ammonia level is actually symptomatic. I suggest considering getting a neurology consult to see if she really needs the Depakote. If she does not, it would probably be better to stop it, since Depakote does frequently raise ammonia levels. As far as medical decision-making capacity, right now, she is not able to tell me anything lucid about her illness, she shows no evidence of any knowledge about her illness, seems quite confused and I do not think she probably has the capacity to make decisions about medical issues, although that should be decided going forward on a case by case basis.   TREATMENT PLAN: Decrease Cymbalta to 30 mg a day as that also can cause some liver problems. As I mentioned, consider the possibility of stopping Depakote, but I am not going to do it right now.  Will follow as needed.   DIAGNOSIS, PRINCIPAL AND PRIMARY:  AXIS I: Dementia, probably multifactorial.   SECONDARY DIAGNOSIS:  AXIS I: Delirium, resolving.     ____________________________ Gonzella Lex, MD jtc:[id number] D: 07/12/2014 18:45:31 ET T: 07/12/2014 19:32:02 ET JOB#: [id number]  cc: Gonzella Lex, MD, <Dictator> Gonzella Lex MD ELECTRONICALLY SIGNED 07/14/2014 0:40

## 2014-11-13 NOTE — Discharge Summary (Signed)
PATIENT NAME:  Tammy Armstrong, Tammy Armstrong MR#:  [id number] DATE OF BIRTH:  1951/06/24  DATE OF ADMISSION:  07/10/2014 DATE OF DISCHARGE:  07/13/2014  For a detailed note please look up the history and physical done on admission by Dr. Dustin Flock.   DIAGNOSES AT DISCHARGE:  1.  Altered mental status and confusion secondary to acute psychosis and bipolar disorder.  2.  History of bipolar disorder.  3.  Diabetes.  4.  Hypertension.  5.  Gastroesophageal reflux disease.   DISPOSITION:  The patient was discharged to behavioral medicine.   DIET: The patient is being discharged on a low-sodium, low-fat, American Diabetic Association diet.   ACTIVITY: As tolerated.   FOLLOWUP:  With Dr. Gayland Curry in the next 1-2 weeks.   DISCHARGE MEDICATIONS:  Xanax 1 mg q.i.d., Norco 5/325 one tab q. 4 hours as needed, Cymbalta 60 mg daily, gabapentin 300 mg q.i.d., metformin 500 mg b.i.d., metoprolol tartrate 50 mg daily, trazodone 100 mg at bedtime, valproic acid 250 mg in the morning and 500 mg at bedtime, glipizide XL 5 mg daily, simvastatin 20 mg daily, omeprazole 20 mg b.i.d., albuterol inhaler 2 puffs 4 times daily as needed, lisinopril 20 mg daily.   CONSULTANTS DURING THE HOSPITAL COURSE: Dr. Weber Cooks from psychiatry.   PERTINENT STUDIES DONE DURING THE HOSPITAL COURSE: A CT scan of the head done on admission without contrast showing no acute intracranial abnormality.   HOSPITAL COURSE: This is a 64 year old female who presented to the hospital with altered mental status and confusion.   1.  Altered mental status/confusion. The most likely cause of this was probably related to her underlying psychiatric illness. The patient did have a mild ammonia level that was elevated, but unlikely her mental status change was related to that. Her CT head was negative. She had no acute infectious source. Despite having ammonia level fluctuating her mental status remained the same. A psychiatric consult was obtained.  After been seen by Dr. Weber Cooks he agreed that the patient was acutely psychotic and had delusional thoughts and thought she would benefit from inpatient psychiatric care, therefore she was discharged there. Further care was as per psychiatry. 2.  History of bipolar disorder. The patient was maintained on her Cymbalta, she will continue that. She was also on some as needed Haldol and Ativan too.  3.  Hypertension. The patient remained hemodynamically stable. She will continue on metoprolol and lisinopril.   4.  Diabetes. The patient was maintained on glipizide. She will resume that.   5.  Anxiety. The patient was maintained on her Xanax. She will resume that.  6.  History of hepatitis C. The patient had a mild ammonia elevation although she had no evidence of florid hepatic encephalopathy. This further can be followed as an outpatient. She was maintained on some lactulose although she does not need to be on something scheduled presently.   The patient is a full code. She is being discharged to behavioral medicine.   TIME SPENT: 40 minutes.      ____________________________ Belia Heman. Verdell Carmine, MD vjs:bu D: 07/13/2014 16:36:40 ET T: 07/13/2014 17:29:34 ET JOB#: [id number]  cc: Belia Heman. Verdell Carmine, MD, <Dictator> Floria Raveling. Astrid Divine, MD Henreitta Leber MD ELECTRONICALLY SIGNED 07/22/2014 10:46

## 2014-11-14 NOTE — Discharge Summary (Signed)
PATIENT NAME:  Tammy Armstrong, Tammy Armstrong MR#:  [id number] DATE OF BIRTH:  1951/03/12  DATE OF ADMISSION:  2011-09-12 DATE OF DISCHARGE:  09/12/2011  ADMITTING DIAGNOSIS: Chest pain.  DISCHARGE DIAGNOSES:  1. Chest pain, noncardiac likely, unclear etiology, suspect gastroesophageal reflux disease related.  2. Bradycardia due to beta blockers.  3. Hypertension. 4. Headache.  5. History of diabetes mellitus.  6. History of hyperlipidemia.  7. History of seizure disorder.  8. History of depression and anxiety.  9. History of suspected opiate abuse.  DISCHARGE CONDITION: Stable.   DISCHARGE MEDICATIONS: The patient is to resume her outpatient medications which include the following. 1. Singulair 10 mg p.o. daily. 2. Premarin 1.25 mg p.o. daily.  3. Folic acid 222 mg p.o. twice daily.  4. Simvastatin 20 mg p.o. at bedtime. 5. Metformin 500 mg p.o. three times daily.  6. Cymbalta 30 mg p.o. daily.  7. Alprazolam 1 mg p.o. three times daily. 8. Glipizide 10 mg 2 tablets once daily, extended-release. 9. Lisinopril 20 mg p.o. daily.  10. Metoprolol succinate ER 25 mg p.o. once daily. 11. Omeprazole 40 mg p.o. daily.   DIET: 2 grams salt, 1800 ADA, low fat, low cholesterol.   PHYSICAL ACTIVITY LIMITATIONS: As tolerated.   DISCHARGE FOLLOWUP: Follow-up with Dr. Astrid Divine in two days after discharge.   CONSULTANTS:  1. Ida Rogue, MD. 2. Care Management.  RADIOLOGIC STUDIES: Myoview stress test, on 09-12-2011: Pharmacological myocardial perfusion imaging study with no significant ischemia noted. There is a small region of thickening and possibly old scar in the apical septal wall with hypokinesis in this region. Otherwise, ejection fraction is normal at 68%. Again, there is no ischemia. No significant EKG changes concerning for ischemia. Overall this is a low risk scan, according to the cardiologist, Dr. Rockey Situ.  Chest x-ray, PA and lateral, on September 12, 2011: no evidence of acute cardiopulmonary  disease.   HISTORY OF PRESENT ILLNESS: The patient is a 64 year old Caucasian female with history of diabetes, hypertension, and hyperlipidemia who presented to the hospital with complaints of chest pain. Please refer to Dr. Zigmund Gottron admission note on September 12, 2011.  On arrival to the Emergency Room, her temperature was 97.5, pulse 60, respiratory rate 18, blood pressure 133/60, and saturation was 97% on room air. Physical examination was unremarkable.   LABS/STUDIES: EKG showed normal sinus rhythm with normal axis, no evidence of ST-T changes   Lab data on 09-12-2011: Normal BMP. Cardiac enzymes, first set as well as subsequent two more sets were within normal limits. Urine drug screen was positive for benzodiazepines as well as MDMA. CBC: White blood cell count 7.9, hemoglobin 12.6, and platelets 164.   Urinalysis was unremarkable.   HOSPITAL COURSE: The patient was admitted to the hospital to observational unit, telemetry. Her cardiac enzymes were cycled and Myoview stress test was performed. As Myoview stress test was negative, it was felt that the patient's pain was very unlikely cardiac, suspected gastroesophageal reflux disease as the patient was endorsing gastroesophageal reflux. It was felt that the patient would benefit from starting omeprazole at 20 mg p.o. daily dose. She is to follow up with her primary care physician and make decisions about further investigation of her chest pain if it reoccurs on omeprazole.   The patient was noted to be bradycardic. It was felt that she was bradycardiac due to high doses of beta blockers she was using at home. Her Toprol-XL was decreased to 25 mg p.o. daily dose. She is to follow-up with her primary care  physician for further recommendations, in regards to management of her blood pressure.   In regards to hypertension, the patient's blood pressure was noted to be satisfactorily controlled on admission to the hospital. In the hospital, it was  around 665L systolic; however, it was fluctuating between 935 to 701 systolic. It was felt that the patient would benefit from advancement of her ACE inhibitor, especially now since her beta blocker dose has been decreased. It is recommended to follow the patient's blood pressure readings as an outpatient and make decisions about advancement of her medications if needed.   The patient was complaining of some headaches while in the hospital. She was given one dose of 10 mg of Percocet. She requested more Percocet refills, however, she was not given any prescriptions for that. She was advised to follow up with her primary care physician and it is up to her primary care physician to make decisions about opiate use for this patient. However, she stated that she usually takes Percocet at home. Her urine drug screen was negative for any opiates. However, because of her insistence to give her a prescription for opiates, I was concerned that the patient could be abusing opiates from the street and now at this point she was in some element of withdrawal.   In regards to diabetes mellitus, hyperlipidemia, history of seizure disorder, as well as depression and anxiety, the patient was advised to continue her outpatient medications. No changes were made here.             The patient is being discharged in stable condition with the above-mentioned medications and follow-up. Her vital signs were stable on the day of discharge with a temperature of 98.4, pulse 66, saturation rate 18, blood pressure 143/79, and saturation 92 to 96% on room air at rest.   TIME SPENT: 40 minutes. ____________________________ Theodoro Grist, MD rv:slb D: 09/03/2011 20:26:22 ET T: 09/04/2011 10:48:35 ET JOB#: [id number]  cc: Theodoro Grist, MD, <Dictator> Floria Raveling. Astrid Divine, MD Theodoro Grist MD ELECTRONICALLY SIGNED 09/10/2011 11:59

## 2014-11-14 NOTE — H&P (Signed)
PATIENT NAME:  Tammy Armstrong, Tammy Armstrong MR#:  [id number] DATE OF BIRTH:  1951/03/27  DATE OF ADMISSION:  09/03/2011  PRIMARY CARE PHYSICIAN: Dr. Astrid Divine.   CHIEF COMPLAINT: Chest pain.   HISTORY OF PRESENT ILLNESS: This is a 64 year old female who comes in from home complaining of substernal chest pain. She describes the pain as being somebody stabbing her in the chest. It is a sharp pain located in the center of her chest, nonradiating, associated with nausea, diaphoresis, dizziness and palpitations. The patient says that she has had intermittent chest pain throughout the day today. She said she has had a dull ache in her chest in the past, but nothing as severe as what she had today. She was a bit concerned and, therefore, came to the ER for further evaluation. Presently, her chest pain score is about 2/10. Prior to coming in, it was about a 10/10.   REVIEW OF SYSTEMS: CONSTITUTIONAL: No documented fever. No weight gain or weight loss. EYES: No blurred or double vision. ENT: No tinnitus or postnasal drip. No redness of oropharynx. RESPIRATORY: No cough, no wheeze, no hemoptysis. CARDIOVASCULAR: Positive chest pain. No orthopnea, no palpitations, no syncope. GASTROINTESTINAL: No nausea, no vomiting, no diarrhea, no abdominal pain, no melena, no hematochezia. GU: No dysuria or hematuria. ENDOCRINE: No polyuria or nocturia. No heat or cold intolerance. HEME: No anemia, no bruising, no bleeding. INTEGUMENTARY: No rashes. No lesions. MUSCULOSKELETAL: No arthritis, no swelling, and no gout. NEUROLOGIC: No numbness, no tingling, no ataxia. No seizure type activity. PSYCH: No anxiety, no insomnia, no ADD.   PAST MEDICAL HISTORY:  1. Diabetes.  2. Hypertension.  3. Hyperlipidemia.  4. History of seizure disorder. 5. Depression/anxiety.   ALLERGIES: Imitrex, morphine and Toradol. Toradol causes hives. Morphine causes gastrointestinal upset. Imitrex causes anaphylaxis.   FAMILY HISTORY: Significant for diabetes.  Both mother and father passed away from complications of heart disease.   SOCIAL HISTORY: She does smoke about 1/2 pack per day and has been smoking for the past 30 years. No alcohol abuse. No illicit drug abuse. Lives at home by herself.   CURRENT MEDICATIONS:  1. Xanax 1 mg t.i.d.  2. Cymbalta 30 mg daily.  3. Glipizide 20 mg daily.  4. Lisinopril 10 mg daily.  5. Metformin 500 mg t.i.d.   6. Metoprolol tartrate 50 mg daily.  7. Premarin 1.25 mg daily.  8. Simvastatin 20 mg daily.  9. Singular 10 mg daily. 10. Valproic acid 250 mg b.i.d.   PHYSICAL EXAMINATION ON ADMISSION:  VITAL SIGNS: Temperature 97.5, pulse 60, respirations 18, blood pressure 133/60, saturations 97% on room air.   GENERAL: She is a pleasant appearing female in no apparent distress.   HEENT: Atraumatic, normocephalic. Extraocular muscles are intact. Pupils are equal and reactive to light. Sclerae anicteric. No conjunctival injection. No pharyngeal erythema.   NECK: Supple. No jugular venous distention. No bruits, no lymphadenopathy, no thyromegaly.   HEART: Regular rate and rhythm. No murmurs, rubs, or clicks.   LUNGS: Clear to auscultation bilaterally. No rales, no rhonchi, no wheezes.   ABDOMEN: Soft, flat, nontender, nondistended. Has good bowel sounds. No hepatosplenomegaly appreciated.   EXTREMITIES: There is no evidence of any cyanosis, clubbing, or peripheral edema. Has +2 pedal and radial pulses bilaterally.   NEUROLOGIC: The patient is alert, awake, and oriented x3 with no focal motor or sensory deficits appreciated bilaterally.   SKIN: Moist and warm with no rashes appreciated.   LYMPHATIC: There is no cervical or axillary lymphadenopathy.  LABORATORY, DIAGNOSTIC, AND RADIOLOGICAL DATA: Glucose of 71, BUN 10, creatinine 0.7, sodium 139, potassium 3.6, chloride 105, bicarbonate 25, troponin less than 0.02, total CK 168. CK-MB 15.1. White cell count 7.9, hemoglobin 12.6, hematocrit 35.9, platelet  count 164. Urine tox positive for benzodiazepine and MDMA. Urinalysis is negative. EKG showed normal sinus rhythm with normal axis and no evidence of any ST or T wave changes.   ASSESSMENT AND PLAN: This is a 64 year old female with a history of diabetes, hypertension, depression/anxiety, history of seizures, hyperlipidemia, and tobacco abuse who came into the hospital with chest pain.  1. Chest pain. The patient's chest pain seems very atypical in nature and reproducible, therefore, likely musculoskeletal or even related to anxiety/depression. She does have significant risk factors given her diabetes and tobacco abuse. Therefore, I will observe her overnight on telemetry, check serial cardiac markers, get a Myoview in the morning. Continue aspirin, nitroglycerin, beta blocker, and statin for now.  2. Hypertension. Presently hemodynamically stable. Continue metoprolol and lisinopril.  3. Hyperlipidemia. Continue simvastatin.  4. History of seizures. Continue Depakote.  5. Diabetes. Continue glipizide, hold metformin. Continue sliding scale insulin for now. Will check a hemoglobin A1c.  6. Depression/anxiety. Continue Xanax and Cymbalta.   CODE STATUS: The patient is a FULL CODE.   TIME SPENT ON ADMISSION: 45 minutes.  ____________________________ Belia Heman. Verdell Carmine, MD vjs:ap D: 09/03/2011 50:09:38 ET            T: 09/03/2011 08:48:22 ET            JOB#: [id number] cc: Belia Heman. Verdell Carmine, MD, <Dictator> Floria Raveling. Astrid Divine, MD Henreitta Leber MD ELECTRONICALLY SIGNED 09/07/2011 10:36

## 2014-11-17 NOTE — H&P (Signed)
PATIENT NAME:  Tammy Armstrong, Tammy Armstrong MR#:  [id number] DATE OF BIRTH:  [birth year]  DATE OF ADMISSION:  07/10/2014  PRIMARY CARE PROVIDER: Floria Raveling. Astrid Divine, MD  EMERGENCY DEPARTMENT REFERRING DOCTOR: Jon Gills. Lord, MD   CHIEF COMPLAINT: Confusion.   HISTORY OF PRESENT ILLNESS: The patient is a 64 year old white female with a history of diabetes, hypertension, hyperlipidemia, history of seizure disorder, depression, anxiety, history of hepatitis C, migraines, panic attacks, bipolar disorder, who currently resides with her friends. They brought her in about a week ago for worsening back pain and a UTI. The patient was discharged home on Cipro and Norco. The patient, according to her friends, was given dose of 2 Norco and she started becoming confused, so they stopped giving her the [place]. The patient finished the course Cipro; however, over the past few days she has been confused. She took a whole bunch of cigarettes and started lighting them all together. The patient does not know what year it is and she does not know where she is. Due to these symptoms, they brought her to the ED. The patient had an ammonia level checked which was slightly elevated. Otherwise, she is not having any fevers. Her WBC count is normal. Her urinalysis does not show any abnormality. CT scan of the head was nonrevealing. The patient's drug screen was positive for marijuana, benzodiazepines, and opioids.   PAST MEDICAL HISTORY: Significant for:  1.  Diabetes, hypertension, hyperlipidemia, history of seizure disorder.  2.  Depression and anxiety.  3.  Bipolar disorder.  4.  Hepatitis C, migraines, history of panic attacks.   PAST SURGICAL HISTORY: Status post cholecystectomy and partial hysterectomy.   ALLERGY: IMITREX, MORPHINE, TORADOL.  FAMILY HISTORY: Significant for diabetes. Both mother and father are passed away from complications of heart disease.   SOCIAL HISTORY: She smokes about a half pack per day, drinks a few  beers 2 times a week, and lives with her friends.  MEDICATIONS AT HOME: Valproic acid 250, 1 tab p.o. q. daily in the morning, 500 at bedtime; trazodone 100, 1 tab p.o. at bedtime; simvastatin 20 at bedtime; ProAir 2 puffs 4 times a day as needed; omeprazole 20, 1 tab p.o. b.i.d.; Norco 5/325, 1 tab p.o. q. 4 p.r.n. for pain; metoprolol tartrate 50 once a day; metformin 500, 1 tab p.o. b.i.d.; lisinopril 10 q. daily; glipizide XL 5 p.o. q. daily; gabapentin 300, 1 tablet 4 times a day; Cymbalta 60 q. daily; alprazolam 1 mg 4 times a day.   REVIEW OF SYSTEMS: Patient confused, unable to give me any history.   PHYSICAL EXAMINATION:  VITAL SIGNS: Temperature 97.4, pulse 68, respirations 20, blood pressure 146/70, O2 of 99%.  GENERAL: The patient is a well-developed, well-nourished female in no acute distress.  HEENT: Head atraumatic, normocephalic. Pupils equally round, reactive to light and accommodation. There is no conjunctival pallor. No scleral icterus. Nasal exam shows no drainage or ulceration.  OROPHARYNX: Clear without any exudate.  NECK: Supple without any thyromegaly.  CARDIOVASCULAR: Regular rate and rhythm. No murmurs, rubs, clicks, or gallops.  LUNGS: Clear to auscultation bilaterally without any rales, rhonchi, wheezing. No accessory muscle usage.  ABDOMEN: Soft, nontender, nondistended. Positive bowel sounds x 4. No hepatosplenomegaly.  EXTREMITIES: No clubbing, cyanosis, or edema.  SKIN: No rash.  LYMPH NODES: Nonpalpable.  MUSCULOSKELETAL: There is no erythema or swelling.  NEUROLOGIC: The patient is awake but not oriented to place or time; oriented to person. Cranial nerves II through XII grossly intact. No  focal deficits.  PSYCHIATRIC: Not anxious or depressed.  SKIN: No rash. LYMPH NODES: Nonpalpable. VASCULAR: Good DP, PT pulses.  EVALUATION IN THE EMERGENCY DEPARTMENT: Glucose 150, BUN 15, creatinine 1.02, sodium 138, potassium 4.0, chloride 102, CO2 of 28, calcium 8.3.  Ammonia level 53. LFTs are normal except albumin at 3.1, alkaline phosphatase 43. WBC 7.9, hemoglobin 14.6, platelet count 160,000. Positive for benzodiazepines, THC, and opioids. Urinalysis: Nitrites negative, leukocytes negative. CT scan of the head shows no acute abnormalities.   ASSESSMENT AND PLAN: The patient is a 64 year old white female with a history of diabetes, hepatitis C, hypertension, hyperlipidemia, brought to the ED with confusion.  1.  Acute encephalopathy, possibly due to hepatic encephalopathy, although ammonia level is slightly elevated. At this time, we will treat with lactulose. Follow ammonia level in the morning. Also, this could be medication-induced. We will hold trazodone, decrease Xanax dose. I would not completely stop the Xanax in light of the patient having seizure and withdrawal seizures from it and leading to asystole in the past. If the patient's mental status does not improve, would obtain psychiatric and neurologic input.  2.  Diabetes. The patient will be on sliding scale. We will continue her on glipizide and metformin.  3.  Hypertension. Continue lisinopril and metoprolol.  4.  Hepatitis C. Continue to monitor. 5.  Hyperlipidemia, currently not on any therapy. We will check a fasting lipid panel in the a.m.  6.  Miscellaneous. The patient will be on Lovenox for deep vein thrombosis prophylaxis.  TIME SPENT ON THIS PATIENT: 45 minutes.   ____________________________ Lafonda Mosses Posey Pronto, MD shp:ST D: 07/10/2014 21:06:00 ET T: 07/10/2014 21:43:02 ET JOB#: [id number]  cc: Brett Darko H. Posey Pronto, MD, <Dictator> Alric Seton MD ELECTRONICALLY SIGNED 07/25/2014 12:13

## 2015-01-18 ENCOUNTER — Encounter: Payer: Self-pay | Admitting: General Practice

## 2015-01-18 ENCOUNTER — Emergency Department
Admission: EM | Admit: 2015-01-18 | Discharge: 2015-01-19 | Disposition: A | Discharge status: Home / Self Care | Payer: Medicare Other | Attending: Emergency Medicine | Admitting: Emergency Medicine

## 2015-01-18 DIAGNOSIS — Y9389 Activity, other specified: Secondary | ICD-10-CM | POA: Insufficient documentation

## 2015-01-18 DIAGNOSIS — F911 Conduct disorder, childhood-onset type: Secondary | ICD-10-CM | POA: Diagnosis present

## 2015-01-18 DIAGNOSIS — Z720 Tobacco use: Secondary | ICD-10-CM | POA: Insufficient documentation

## 2015-01-18 DIAGNOSIS — F03918 Unspecified dementia, unspecified severity, with other behavioral disturbance: Secondary | ICD-10-CM

## 2015-01-18 DIAGNOSIS — E119 Type 2 diabetes mellitus without complications: Secondary | ICD-10-CM | POA: Diagnosis not present

## 2015-01-18 DIAGNOSIS — I10 Essential (primary) hypertension: Secondary | ICD-10-CM | POA: Insufficient documentation

## 2015-01-18 DIAGNOSIS — Y998 Other external cause status: Secondary | ICD-10-CM | POA: Diagnosis not present

## 2015-01-18 DIAGNOSIS — Z79899 Other long term (current) drug therapy: Secondary | ICD-10-CM | POA: Diagnosis not present

## 2015-01-18 DIAGNOSIS — F419 Anxiety disorder, unspecified: Secondary | ICD-10-CM | POA: Diagnosis present

## 2015-01-18 DIAGNOSIS — Y9289 Other specified places as the place of occurrence of the external cause: Secondary | ICD-10-CM | POA: Insufficient documentation

## 2015-01-18 DIAGNOSIS — F0391 Unspecified dementia with behavioral disturbance: Secondary | ICD-10-CM | POA: Diagnosis not present

## 2015-01-18 DIAGNOSIS — Z23 Encounter for immunization: Secondary | ICD-10-CM | POA: Insufficient documentation

## 2015-01-18 LAB — COMPREHENSIVE METABOLIC PANEL
ALK PHOS: 48 U/L (ref 38–126)
ALT: 44 U/L (ref 14–54)
AST: 49 U/L — AB (ref 15–41)
Albumin: 4.5 g/dL (ref 3.5–5.0)
Anion gap: 10 (ref 5–15)
BUN: 22 mg/dL — AB (ref 6–20)
CHLORIDE: 105 mmol/L (ref 101–111)
CO2: 27 mmol/L (ref 22–32)
CREATININE: 1.32 mg/dL — AB (ref 0.44–1.00)
Calcium: 9.8 mg/dL (ref 8.9–10.3)
GFR calc non Af Amer: 42 mL/min — ABNORMAL LOW (ref >60)
GFR, EST AFRICAN AMERICAN: 49 mL/min — AB (ref >60)
GLUCOSE: 89 mg/dL (ref 65–99)
Potassium: 4.8 mmol/L (ref 3.5–5.1)
Sodium: 142 mmol/L (ref 135–145)
Total Bilirubin: 0.6 mg/dL (ref 0.3–1.2)
Total Protein: 7.6 g/dL (ref 6.5–8.1)

## 2015-01-18 LAB — URINE DRUG SCREEN, QUALITATIVE (ARMC ONLY)
Amphetamines, Ur Screen: NOT DETECTED
BARBITURATES, UR SCREEN: NOT DETECTED
Benzodiazepine, Ur Scrn: POSITIVE — AB
CANNABINOID 50 NG, UR ~~LOC~~: POSITIVE — AB
COCAINE METABOLITE, UR ~~LOC~~: NOT DETECTED
MDMA (ECSTASY) UR SCREEN: NOT DETECTED
Methadone Scn, Ur: NOT DETECTED
Opiate, Ur Screen: NOT DETECTED
PHENCYCLIDINE (PCP) UR S: NOT DETECTED
Tricyclic, Ur Screen: NOT DETECTED

## 2015-01-18 LAB — URINALYSIS COMPLETE WITH MICROSCOPIC (ARMC ONLY)
Bacteria, UA: NONE SEEN
Bilirubin Urine: NEGATIVE
Glucose, UA: NEGATIVE mg/dL
HGB URINE DIPSTICK: NEGATIVE
KETONES UR: NEGATIVE mg/dL
Nitrite: NEGATIVE
PH: 5 (ref 5.0–8.0)
Protein, ur: NEGATIVE mg/dL
Specific Gravity, Urine: 1.017 (ref 1.005–1.030)

## 2015-01-18 LAB — CBC
HEMATOCRIT: 42 % (ref 35.0–47.0)
Hemoglobin: 14.3 g/dL (ref 12.0–16.0)
MCH: 31.8 pg (ref 26.0–34.0)
MCHC: 34 g/dL (ref 32.0–36.0)
MCV: 93.6 fL (ref 80.0–100.0)
Platelets: 205 10*3/uL (ref 150–440)
RBC: 4.48 MIL/uL (ref 3.80–5.20)
RDW: 14.5 % (ref 11.5–14.5)
WBC: 9.9 10*3/uL (ref 3.6–11.0)

## 2015-01-18 LAB — ACETAMINOPHEN LEVEL

## 2015-01-18 LAB — ETHANOL

## 2015-01-18 LAB — SALICYLATE LEVEL

## 2015-01-18 MED ORDER — TETANUS-DIPHTH-ACELL PERTUSSIS 5-2.5-18.5 LF-MCG/0.5 IM SUSP
INTRAMUSCULAR | Status: AC
  Administered 2015-01-18: 0.5 mL via INTRAMUSCULAR
  Filled 2015-01-18: qty 0.5

## 2015-01-18 MED ORDER — CLONAZEPAM 1 MG PO TABS
ORAL_TABLET | ORAL | Status: AC
  Administered 2015-01-18: 1 mg via ORAL
  Filled 2015-01-18: qty 1

## 2015-01-18 MED ORDER — TETANUS-DIPHTHERIA TOXOIDS TD 5-2 LFU IM INJ: 0.5000 mL | INJECTION | Freq: Once | INTRAMUSCULAR | Status: DC

## 2015-01-18 MED ORDER — TETANUS-DIPHTH-ACELL PERTUSSIS 5-2.5-18.5 LF-MCG/0.5 IM SUSP
0.5000 mL | Freq: Once | INTRAMUSCULAR | Status: AC
  Administered 2015-01-18: 0.5 mL via INTRAMUSCULAR

## 2015-01-18 MED ORDER — CLONAZEPAM 1 MG PO TABS
1.0000 mg | ORAL_TABLET | Freq: Three times a day (TID) | ORAL | Status: DC
  Administered 2015-01-18 – 2015-01-19 (×2): 1 mg via ORAL
  Filled 2015-01-18: qty 1

## 2015-01-18 NOTE — ED Notes (Signed)
BEHAVIORAL HEALTH ROUNDING Patient sleeping: No. Patient alert and oriented: yes Behavior appropriate: Yes.  ;  Nutrition and fluids offered: Yes  Toileting and hygiene offered: Yes  Sitter present: staff present outside room Law enforcement present: Event organiser outside room

## 2015-01-18 NOTE — ED Notes (Signed)
Behavioral intake at bedside.
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
## 2015-01-18 NOTE — ED Notes (Signed)
BEHAVIORAL HEALTH ROUNDING Patient sleeping: Yes.   Patient alert and oriented: no Behavior appropriate: Yes.  ;  Nutrition and fluids offered: No Toileting and hygiene offered: No Sitter present: no Law enforcement present: Yes sitting outside room
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
## 2015-01-18 NOTE — ED Notes (Signed)
IVC/Consult ordered/All paperwork on chart 

## 2015-01-18 NOTE — ED Notes (Signed)

## 2015-01-18 NOTE — ED Notes (Signed)
BEHAVIORAL HEALTH ROUNDING Patient sleeping: Yes.   Patient alert and oriented: not applicable Behavior appropriate: Yes.  ;  Nutrition and fluids offered: No Toileting and hygiene offered: No Sitter present: no Law enforcement present: Yes sitting near patients door.

## 2015-01-18 NOTE — ED Notes (Signed)
BEHAVIORAL HEALTH ROUNDING Patient sleeping: No. Patient alert and oriented: yes Behavior appropriate: Yes.  ;  Nutrition and fluids offered: Yes  Toileting and hygiene offered: Yes  Sitter present: no Law enforcement present: Yes,  Sitting near patients door
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
## 2015-01-19 DIAGNOSIS — F03918 Unspecified dementia, unspecified severity, with other behavioral disturbance: Secondary | ICD-10-CM

## 2015-01-19 DIAGNOSIS — F0391 Unspecified dementia with behavioral disturbance: Secondary | ICD-10-CM

## 2015-01-19 MED ORDER — DIAZEPAM 2 MG PO TABS
ORAL_TABLET | ORAL | Status: AC
  Administered 2015-01-19: 2 mg via ORAL
  Filled 2015-01-19: qty 1

## 2015-01-19 MED ORDER — ACETAMINOPHEN 325 MG PO TABS
650.0000 mg | ORAL_TABLET | Freq: Once | ORAL | Status: AC
  Administered 2015-01-19: 650 mg via ORAL

## 2015-01-19 MED ORDER — DIPHENHYDRAMINE HCL 25 MG PO CAPS
25.0000 mg | ORAL_CAPSULE | Freq: Once | ORAL | Status: AC
  Administered 2015-01-19: 25 mg via ORAL
  Filled 2015-01-19: qty 1

## 2015-01-19 MED ORDER — ACETAMINOPHEN 325 MG PO TABS
ORAL_TABLET | ORAL | Status: AC
  Administered 2015-01-19: 650 mg via ORAL
  Filled 2015-01-19: qty 2

## 2015-01-19 MED ORDER — DIAZEPAM 2 MG PO TABS
2.0000 mg | ORAL_TABLET | Freq: Once | ORAL | Status: AC
  Administered 2015-01-19: 2 mg via ORAL

## 2015-01-19 NOTE — Discharge Instructions (Signed)
You have been seen in the Emergency Department (ED) today for a psychiatric complaint.  You have been evaluated by psychiatry and we believe you are safe to be discharged from the hospital.    Please return to the ED immediately if you have ANY thoughts of hurting yourself or anyone else, so that we may help you.  Please avoid alcohol and drug use.  Follow up with your doctor and/or therapist as soon as possible regarding today's ED visit.   Please follow up any other recommendations and clinic appointments provided by the psychiatry team that saw you in the Emergency Department.   Dementia Dementia is a word that is used to describe problems with the brain and how it works. People with dementia have memory loss. They may also have problems with thinking, speaking, or solving problems. It can affect how they act around people, how they do their job, their mood, and their personality. These changes may not show up for a long time. Family or friends may not notice problems in the early part of this disease. HOME CARE The following tips are for the person living with, or caring for, the person with dementia. Make the home safe.  Remove locks on bathroom doors.  Use childproof locks on cabinets where alcohol, cleaning supplies, or chemicals are stored.  Put outlet covers in electrical outlets.  Put in childproof locks to keep doors and windows safe.  Remove stove knobs, or put in safety knobs that shut off on their own.  Lower the temperature on water heaters.  Label medicines. Lock them in a safe place.  Keep knives, lighters, matches, power tools, and guns out of reach or in a safe place.  Remove objects that might break or can hurt the person.  Make sure lighting is good inside and outside.  Put in grab bars if needed.  Use a device that detects falls or other needs for help. Lessen confusion.  Keep familiar objects and people around.  Use night lights or low lit (dim) lights  at night.  Label objects or areas.  Use reminders, notes, or directions for daily activities or tasks.  Keep a simple routine that is the same for waking, meals, bathing, dressing, and bedtime.  Create a calm and quiet home.  Put up clocks and calendars.  Keep emergency numbers and the home address near all phones.  Help show the different times of day. Open the curtains during the day to let light in. Speak clearly and directly.  Choose simple words and short sentences.  Use a gentle, calm voice.  Do not interrupt.  If the person has a hard time finding a word to use, give them the word or thought.  Ask 1 question at a time. Give enough time for the person to answer. Repeat the question if the person does not answer. Do things that lessen restlessness.  Provide a comfortable bed.  Have the same bedtime routine every night.  Have a regular walking and activity schedule.  Lessen naps during the day.  Do not let the person drink a lot of caffeine.  Go to events that are not overwhelming. Eat well and drink fluids.  Lessen distractions during meal times and snacks.  Avoid foods that are too hot or too cold.  Watch how the person chews and swallows. This is to make sure they do not choke. Other  Keep all vision, hearing, dental, and medical visits with the doctor.  Only give medicines as told  by the doctor.  Watch the person's driving ability. Do not let the person drive if he or she cannot drive safely.  Use a program that helps find a person if they become missing. You may need to register with this program. GET HELP RIGHT AWAY IF:   A fever of 102 F (38.9 C) develops.  Confusion develops or gets worse.  Sleepiness develops or gets worse.  Staying awake is hard to do.  New behavior problems start like mood swings, aggression, and seeing things that are not there.  Problems with balance, speech, or falling develop.  Problems swallowing  develop.  Any problems of another sickness develop. MAKE SURE YOU:  Understand these instructions.  Will watch his or her condition.  Will get help right away if he or she is not doing well or gets worse. Document Released: 06/21/2008 Document Revised: 10/01/2011 Document Reviewed: 12/04/2010 [facility] Patient Information 2015 [place], [place]. This information is not intended to replace advice given to you by your health care provider. Make sure you discuss any questions you have with your health care provider.

## 2015-01-19 NOTE — ED Notes (Signed)
BEHAVIORAL HEALTH ROUNDING Patient sleeping: Yes.   Patient alert and oriented: not applicable Behavior appropriate: Yes.  ; If no, describe:  Nutrition and fluids offered: Patient sleeping Toileting and hygiene offered: Patient sleeping Sitter present: yes Law enforcement present: Yes   ENVIRONMENTAL ASSESSMENT Potentially harmful objects out of patient reach: Yes.   Personal belongings secured: Yes.   Patient dressed in hospital provided attire only: Yes.   Plastic bags out of patient reach: Yes.   Patient care equipment (cords, cables, call bells, lines, and drains) shortened, removed, or accounted for: Yes.   Equipment and supplies removed from bottom of stretcher: Yes.   Potentially toxic materials out of patient reach: Yes.   Sharps container removed or out of patient reach: Yes.   

## 2015-01-19 NOTE — ED Notes (Signed)
BEHAVIORAL HEALTH ROUNDING Patient sleeping: Yes.   Patient alert and oriented: no Behavior appropriate: Yes.  ; If no, describe:  Nutrition and fluids offered: No Toileting and hygiene offered: No Sitter present: no Law enforcement present: Yes, ODS 

## 2015-01-19 NOTE — ED Provider Notes (Signed)
-----------------------------------------   5:20 PM on 01/19/2015 -----------------------------------------   BP 165/68 mmHg  Pulse 90  Temp(Src) 97.8 F (36.6 C) (Oral)  Resp 20  Ht 5\' 3"  (1.6 m)  Wt 154 lb (69.854 kg)  BMI 27.29 kg/m2  SpO2 99%   Discussed in person with Dr. Weber Cooks the disposition of this patient.  He feels that the patient is appropriate for discharge and outpatient follow-up.  No additional intervention is needed at this time.  The patient is hemodynamically stable.  Hinda Kehr, MD 01/19/15 [phone]

## 2015-01-19 NOTE — ED Notes (Signed)
Pt resting in bed with eyes closed. No unusual behavior observed. Pt has no needs or concerns at this time. Will continue to monitor and f/u as needed.  
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
## 2015-01-19 NOTE — ED Notes (Signed)
BEHAVIORAL HEALTH ROUNDING Patient sleeping: No. Patient alert and oriented: yes Behavior appropriate: Yes.  ; If no, describe:  Nutrition and fluids offered: Yes  Toileting and hygiene offered: Yes  Sitter present: yes Law enforcement present: Yes  

## 2015-01-19 NOTE — ED Notes (Signed)
Meal was given to patient.

## 2015-01-19 NOTE — ED Notes (Signed)

## 2015-01-19 NOTE — ED Notes (Signed)
Pt report received from [facility]. Pt transferred to ED accompanied by reporting RN and [employer] officer without incident. Pt care assumed. Pt oriented to unit and advised that cameras are present in every room of unit for pt safety. Pt verbalizes understanding. Pt given Benadryl as ordered. Pt tearful, states she feels like she is in prison. Pt reassured she is here for her safety only. Pt verbalizes understanding. Pt provided with extra blanket and pillow for her chronic back issues. Pt has no further needs or concerns at this time.

## 2015-01-19 NOTE — ED Notes (Signed)
BEHAVIORAL HEALTH ROUNDING Patient sleeping: No. Patient alert and oriented: yes Behavior appropriate: Yes.  ; If no, describe:   Nutrition and fluids offered: Yes  Toileting and hygiene offered: Yes  Sitter present: no Law enforcement present: Yes  and ODS  

## 2015-01-19 NOTE — ED Notes (Signed)
MD at bedside. 

## 2015-01-19 NOTE — ED Notes (Signed)
BEHAVIORAL HEALTH ROUNDING Patient sleeping: No. Patient alert and oriented: yes Behavior appropriate: Yes.  ; If no, describe:  Nutrition and fluids offered: Yes  Toileting and hygiene offered: Yes  Sitter present: no Law enforcement present: Yes, ODS 

## 2015-01-19 NOTE — ED Notes (Signed)
.  Pt resting in bed with eyes closed. No unusual behavior observed. Pt has no needs or concerns at this time. Will continue to monitor and f/u as needed.  

## 2015-01-20 LAB — URINE CULTURE

## 2015-02-12 DIAGNOSIS — G8929 Other chronic pain: Secondary | ICD-10-CM | POA: Insufficient documentation

## 2015-02-12 DIAGNOSIS — E7800 Pure hypercholesterolemia, unspecified: Secondary | ICD-10-CM | POA: Insufficient documentation

## 2015-02-12 DIAGNOSIS — F17200 Nicotine dependence, unspecified, uncomplicated: Secondary | ICD-10-CM | POA: Insufficient documentation

## 2015-04-06 ENCOUNTER — Inpatient Hospital Stay
Admission: EM | Admit: 2015-04-06 | Discharge: 2015-04-08 | DRG: 103 | Disposition: A | Discharge status: Home / Self Care | Payer: Medicare Other | Attending: Internal Medicine | Admitting: Internal Medicine

## 2015-04-06 ENCOUNTER — Emergency Department: Payer: Medicare Other

## 2015-04-06 ENCOUNTER — Encounter: Payer: Self-pay | Admitting: Emergency Medicine

## 2015-04-06 DIAGNOSIS — Z888 Allergy status to other drugs, medicaments and biological substances status: Secondary | ICD-10-CM | POA: Diagnosis not present

## 2015-04-06 DIAGNOSIS — G40909 Epilepsy, unspecified, not intractable, without status epilepticus: Secondary | ICD-10-CM | POA: Diagnosis present

## 2015-04-06 DIAGNOSIS — Z880 Allergy status to penicillin: Secondary | ICD-10-CM

## 2015-04-06 DIAGNOSIS — G43909 Migraine, unspecified, not intractable, without status migrainosus: Secondary | ICD-10-CM | POA: Diagnosis present

## 2015-04-06 DIAGNOSIS — I6529 Occlusion and stenosis of unspecified carotid artery: Secondary | ICD-10-CM

## 2015-04-06 DIAGNOSIS — Z8673 Personal history of transient ischemic attack (TIA), and cerebral infarction without residual deficits: Secondary | ICD-10-CM | POA: Diagnosis not present

## 2015-04-06 DIAGNOSIS — I639 Cerebral infarction, unspecified: Secondary | ICD-10-CM

## 2015-04-06 DIAGNOSIS — Z885 Allergy status to narcotic agent status: Secondary | ICD-10-CM

## 2015-04-06 DIAGNOSIS — B1920 Unspecified viral hepatitis C without hepatic coma: Secondary | ICD-10-CM | POA: Diagnosis present

## 2015-04-06 DIAGNOSIS — R51 Headache: Secondary | ICD-10-CM

## 2015-04-06 DIAGNOSIS — F17210 Nicotine dependence, cigarettes, uncomplicated: Secondary | ICD-10-CM | POA: Diagnosis present

## 2015-04-06 DIAGNOSIS — G43409 Hemiplegic migraine, not intractable, without status migrainosus: Secondary | ICD-10-CM | POA: Diagnosis present

## 2015-04-06 DIAGNOSIS — I10 Essential (primary) hypertension: Secondary | ICD-10-CM | POA: Diagnosis present

## 2015-04-06 DIAGNOSIS — G43109 Migraine with aura, not intractable, without status migrainosus: Secondary | ICD-10-CM | POA: Diagnosis present

## 2015-04-06 DIAGNOSIS — J449 Chronic obstructive pulmonary disease, unspecified: Secondary | ICD-10-CM | POA: Diagnosis present

## 2015-04-06 DIAGNOSIS — E119 Type 2 diabetes mellitus without complications: Secondary | ICD-10-CM | POA: Diagnosis present

## 2015-04-06 DIAGNOSIS — R519 Headache, unspecified: Secondary | ICD-10-CM

## 2015-04-06 DIAGNOSIS — I6522 Occlusion and stenosis of left carotid artery: Secondary | ICD-10-CM | POA: Diagnosis present

## 2015-04-06 DIAGNOSIS — F329 Major depressive disorder, single episode, unspecified: Secondary | ICD-10-CM | POA: Diagnosis present

## 2015-04-06 DIAGNOSIS — D320 Benign neoplasm of cerebral meninges: Secondary | ICD-10-CM | POA: Diagnosis present

## 2015-04-06 DIAGNOSIS — F419 Anxiety disorder, unspecified: Secondary | ICD-10-CM | POA: Diagnosis present

## 2015-04-06 DIAGNOSIS — Z881 Allergy status to other antibiotic agents status: Secondary | ICD-10-CM | POA: Diagnosis not present

## 2015-04-06 DIAGNOSIS — E785 Hyperlipidemia, unspecified: Secondary | ICD-10-CM | POA: Diagnosis present

## 2015-04-06 DIAGNOSIS — R531 Weakness: Secondary | ICD-10-CM

## 2015-04-06 LAB — DIFFERENTIAL
BASOS ABS: 0.1 10*3/uL (ref 0–0.1)
BASOS PCT: 1 %
EOS ABS: 0.2 10*3/uL (ref 0–0.7)
EOS PCT: 2 %
Lymphocytes Relative: 45 %
Lymphs Abs: 4.5 10*3/uL — ABNORMAL HIGH (ref 1.0–3.6)
MONOS PCT: 7 %
Monocytes Absolute: 0.7 10*3/uL (ref 0.2–0.9)
NEUTROS PCT: 45 %
Neutro Abs: 4.4 10*3/uL (ref 1.4–6.5)

## 2015-04-06 LAB — URINALYSIS COMPLETE WITH MICROSCOPIC (ARMC ONLY)
BILIRUBIN URINE: NEGATIVE
Bacteria, UA: NONE SEEN
Glucose, UA: NEGATIVE mg/dL
HGB URINE DIPSTICK: NEGATIVE
KETONES UR: NEGATIVE mg/dL
NITRITE: NEGATIVE
PH: 6 (ref 5.0–8.0)
PROTEIN: NEGATIVE mg/dL
SPECIFIC GRAVITY, URINE: 1.005 (ref 1.005–1.030)

## 2015-04-06 LAB — CBC
HEMATOCRIT: 38.6 % (ref 35.0–47.0)
Hemoglobin: 13.1 g/dL (ref 12.0–16.0)
MCH: 32.2 pg (ref 26.0–34.0)
MCHC: 33.9 g/dL (ref 32.0–36.0)
MCV: 95 fL (ref 80.0–100.0)
Platelets: 197 10*3/uL (ref 150–440)
RBC: 4.06 MIL/uL (ref 3.80–5.20)
RDW: 13.3 % (ref 11.5–14.5)
WBC: 9.9 10*3/uL (ref 3.6–11.0)

## 2015-04-06 LAB — TROPONIN I

## 2015-04-06 LAB — PROTIME-INR
INR: 1.02
Prothrombin Time: 13.6 seconds (ref 11.4–15.0)

## 2015-04-06 LAB — COMPREHENSIVE METABOLIC PANEL
ALT: 20 U/L (ref 14–54)
ANION GAP: 8 (ref 5–15)
AST: 27 U/L (ref 15–41)
Albumin: 3.8 g/dL (ref 3.5–5.0)
Alkaline Phosphatase: 51 U/L (ref 38–126)
BUN: 11 mg/dL (ref 6–20)
CHLORIDE: 104 mmol/L (ref 101–111)
CO2: 28 mmol/L (ref 22–32)
Calcium: 9.2 mg/dL (ref 8.9–10.3)
Creatinine, Ser: 0.73 mg/dL (ref 0.44–1.00)
GFR calc Af Amer: >60 mL/min (ref >60)
Glucose, Bld: 51 mg/dL — ABNORMAL LOW (ref 65–99)
POTASSIUM: 3.5 mmol/L (ref 3.5–5.1)
Sodium: 140 mmol/L (ref 135–145)
TOTAL PROTEIN: 6.9 g/dL (ref 6.5–8.1)
Total Bilirubin: 1 mg/dL (ref 0.3–1.2)

## 2015-04-06 LAB — LIPID PANEL
Cholesterol: 140 mg/dL (ref 0–200)
HDL: 34 mg/dL — AB (ref >40)
LDL Cholesterol: 61 mg/dL (ref 0–99)
Total CHOL/HDL Ratio: 4.1 RATIO
Triglycerides: 226 mg/dL — ABNORMAL HIGH (ref <150)
VLDL: 45 mg/dL — ABNORMAL HIGH (ref 0–40)

## 2015-04-06 LAB — GLUCOSE, CAPILLARY
GLUCOSE-CAPILLARY: 56 mg/dL — AB (ref 65–99)
GLUCOSE-CAPILLARY: 68 mg/dL (ref 65–99)
GLUCOSE-CAPILLARY: 93 mg/dL (ref 65–99)

## 2015-04-06 LAB — APTT: APTT: 31 s (ref 24–36)

## 2015-04-06 MED ORDER — INSULIN ASPART 100 UNIT/ML ~~LOC~~ SOLN
0.0000 [IU] | Freq: Three times a day (TID) | SUBCUTANEOUS | Status: DC
  Filled 2015-04-06: qty 2

## 2015-04-06 MED ORDER — ALUM & MAG HYDROXIDE-SIMETH 200-200-20 MG/5ML PO SUSP: 30.0000 mL | Freq: Four times a day (QID) | ORAL | Status: DC | PRN

## 2015-04-06 MED ORDER — ACETAMINOPHEN 650 MG RE SUPP: 650.0000 mg | Freq: Four times a day (QID) | RECTAL | Status: DC | PRN

## 2015-04-06 MED ORDER — METOPROLOL SUCCINATE ER 50 MG PO TB24
50.0000 mg | ORAL_TABLET | Freq: Every day | ORAL | Status: DC
  Administered 2015-04-08: 50 mg via ORAL
  Filled 2015-04-06 (×3): qty 1

## 2015-04-06 MED ORDER — SENNOSIDES-DOCUSATE SODIUM 8.6-50 MG PO TABS
1.0000 | ORAL_TABLET | Freq: Every evening | ORAL | Status: DC | PRN
  Filled 2015-04-06: qty 1

## 2015-04-06 MED ORDER — CLONAZEPAM 1 MG PO TABS
1.0000 mg | ORAL_TABLET | Freq: Three times a day (TID) | ORAL | Status: DC
  Administered 2015-04-06 – 2015-04-08 (×5): 1 mg via ORAL
  Filled 2015-04-06 (×5): qty 1

## 2015-04-06 MED ORDER — INFLUENZA VAC SPLIT QUAD 0.5 ML IM SUSY
0.5000 mL | PREFILLED_SYRINGE | INTRAMUSCULAR | Status: AC
  Administered 2015-04-08: 12:00:00 0.5 mL via INTRAMUSCULAR
  Filled 2015-04-06: qty 0.5

## 2015-04-06 MED ORDER — OXYCODONE-ACETAMINOPHEN 5-325 MG PO TABS
1.0000 | ORAL_TABLET | Freq: Four times a day (QID) | ORAL | Status: DC | PRN
  Administered 2015-04-06 – 2015-04-07 (×2): 1 via ORAL
  Filled 2015-04-06 (×2): qty 1

## 2015-04-06 MED ORDER — TRAZODONE HCL 100 MG PO TABS
100.0000 mg | ORAL_TABLET | Freq: Every day | ORAL | Status: DC
  Administered 2015-04-06: 100 mg via ORAL
  Administered 2015-04-07: 300 mg via ORAL
  Filled 2015-04-06: qty 1
  Filled 2015-04-06: qty 3

## 2015-04-06 MED ORDER — NICOTINE 14 MG/24HR TD PT24
14.0000 mg | MEDICATED_PATCH | Freq: Every day | TRANSDERMAL | Status: DC
  Filled 2015-04-06 (×3): qty 1

## 2015-04-06 MED ORDER — INSULIN ASPART 100 UNIT/ML ~~LOC~~ SOLN: 0.0000 [IU] | Freq: Every day | SUBCUTANEOUS | Status: DC

## 2015-04-06 MED ORDER — SIMVASTATIN 20 MG PO TABS
20.0000 mg | ORAL_TABLET | Freq: Every day | ORAL | Status: DC
  Administered 2015-04-07: 20 mg via ORAL
  Filled 2015-04-06: qty 1

## 2015-04-06 MED ORDER — BUDESONIDE-FORMOTEROL FUMARATE 80-4.5 MCG/ACT IN AERO
2.0000 | INHALATION_SPRAY | Freq: Two times a day (BID) | RESPIRATORY_TRACT | Status: DC
  Administered 2015-04-07 – 2015-04-08 (×3): 2 via RESPIRATORY_TRACT
  Filled 2015-04-06 (×2): qty 6.9

## 2015-04-06 MED ORDER — LORAZEPAM BOLUS VIA INFUSION
1.0000 mg | Freq: Once | INTRAVENOUS | Status: DC
  Filled 2015-04-06: qty 1

## 2015-04-06 MED ORDER — PNEUMOCOCCAL VAC POLYVALENT 25 MCG/0.5ML IJ INJ
0.5000 mL | INJECTION | INTRAMUSCULAR | Status: AC
  Administered 2015-04-08: 12:00:00 0.5 mL via INTRAMUSCULAR
  Filled 2015-04-06: qty 0.5

## 2015-04-06 MED ORDER — LISINOPRIL 10 MG PO TABS
10.0000 mg | ORAL_TABLET | Freq: Every day | ORAL | Status: DC
  Administered 2015-04-08: 10:00:00 10 mg via ORAL
  Filled 2015-04-06 (×3): qty 1

## 2015-04-06 MED ORDER — GABAPENTIN 300 MG PO CAPS
300.0000 mg | ORAL_CAPSULE | Freq: Four times a day (QID) | ORAL | Status: DC
  Administered 2015-04-06 – 2015-04-08 (×5): 300 mg via ORAL
  Filled 2015-04-06 (×5): qty 1

## 2015-04-06 MED ORDER — ACETAMINOPHEN 325 MG PO TABS
650.0000 mg | ORAL_TABLET | Freq: Four times a day (QID) | ORAL | Status: DC | PRN
  Filled 2015-04-06: qty 2

## 2015-04-06 MED ORDER — HYDROCODONE-ACETAMINOPHEN 5-325 MG PO TABS
1.0000 | ORAL_TABLET | ORAL | Status: DC | PRN
  Administered 2015-04-06 – 2015-04-08 (×2): 1 via ORAL
  Filled 2015-04-06 (×2): qty 1

## 2015-04-06 MED ORDER — ASPIRIN 325 MG PO TABS
325.0000 mg | ORAL_TABLET | Freq: Every day | ORAL | Status: DC
  Administered 2015-04-06 – 2015-04-08 (×3): 325 mg via ORAL
  Filled 2015-04-06 (×4): qty 1

## 2015-04-06 MED ORDER — PANTOPRAZOLE SODIUM 40 MG PO TBEC
40.0000 mg | DELAYED_RELEASE_TABLET | Freq: Every day | ORAL | Status: DC
  Administered 2015-04-07 – 2015-04-08 (×2): 40 mg via ORAL
  Filled 2015-04-06 (×2): qty 1

## 2015-04-06 MED ORDER — ONDANSETRON HCL 4 MG PO TABS: 4.0000 mg | ORAL_TABLET | Freq: Four times a day (QID) | ORAL | Status: DC | PRN

## 2015-04-06 MED ORDER — DULOXETINE HCL 60 MG PO CPEP
60.0000 mg | ORAL_CAPSULE | Freq: Every day | ORAL | Status: DC
  Administered 2015-04-06 – 2015-04-08 (×3): 60 mg via ORAL
  Filled 2015-04-06 (×3): qty 1

## 2015-04-06 MED ORDER — ONDANSETRON HCL 4 MG/2ML IJ SOLN: 4.0000 mg | Freq: Four times a day (QID) | INTRAMUSCULAR | Status: DC | PRN

## 2015-04-06 MED ORDER — GLIPIZIDE ER 5 MG PO TB24
5.0000 mg | ORAL_TABLET | Freq: Every day | ORAL | Status: DC
  Administered 2015-04-07 – 2015-04-08 (×2): 5 mg via ORAL
  Filled 2015-04-06 (×3): qty 1

## 2015-04-06 MED ORDER — ENOXAPARIN SODIUM 40 MG/0.4ML ~~LOC~~ SOLN
40.0000 mg | SUBCUTANEOUS | Status: DC
  Administered 2015-04-06 – 2015-04-07 (×2): 40 mg via SUBCUTANEOUS
  Filled 2015-04-06 (×2): qty 0.4

## 2015-04-06 NOTE — ED Provider Notes (Addendum)
[facility] Emergency Department Provider Note  ____________________________________________  Time seen: 1730  I have reviewed the triage vital signs and the nursing notes.  History from both the patient and from an attendant from the group home. The patient has some limited cognition and communication, but is helpful with some history details. The attendant is helpful with records showing her basic past medical history, but offers little information in terms of progression of symptoms today.  HISTORY  Chief Complaint Weakness  right side  headache    HPI Tammy Armstrong is a 64 y.o. female who resides in a group home. She reports feeling fatigued earlier today. She went to go lie down on her bed. As she approached the bed she weak and fell forward onto the bed without any traumatic injury. This occurred at 1550. She was assisted onto the bed in better position. She reports that since then she has had weakness in the right arm and right leg with some decreased sensation. She is also complaining of a headache on the left.  The patient reports she had a stroke proximally 2 years ago. She is not able to give me much detail on this. She does report that she was transferred to [facility] for care.  She denies any chest pain, shortness of breath, or nausea.   Past Medical History  Diagnosis Date  . Migraines   . Hypertension   . Hepatitis C   . Anxiety   . Stroke   . Seizure 07/02/2011  . 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 Respiratory failure 07/04/2011  . COPD (chronic obstructive pulmonary disease) 07/04/2011  . Hypercholesterolemia   . Diabetes mellitus   . Diabetes mellitus 07/04/2011  . Depression     Patient Active Problem List   Diagnosis Date Noted  . Dementia with behavioral disturbance 01/19/2015  . Diabetes mellitus 07/04/2011  . Anxiety 07/04/2011  . COPD (chronic obstructive pulmonary disease) 07/04/2011  . Purulent bronchitis 07/04/2011   . 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 Abdominal hysterectomy    . Cholecystectomy      Current Outpatient Rx  Name  Route  Sig  Dispense  Refill  . budesonide-formoterol (SYMBICORT) 80-4.5 MCG/ACT inhaler   Inhalation   Inhale 2 puffs into the lungs 2 (two) times daily.         . clonazePAM (KLONOPIN) 1 MG tablet   Oral   Take 1 mg by mouth every 8 (eight) hours.         . DULoxetine (CYMBALTA) 60 MG capsule   Oral   Take 60 mg by mouth daily.         Marland Kitchen gabapentin (NEURONTIN) 300 MG capsule   Oral   Take 300 mg by mouth 4 (four) times daily.         Marland Kitchen glipiZIDE (GLUCOTROL XL) 5 MG 24 hr tablet   Oral   Take 5 mg by mouth daily.         Marland Kitchen lisinopril (PRINIVIL,ZESTRIL) 10 MG tablet   Oral   Take 10 mg by mouth daily.           . metFORMIN (GLUCOPHAGE-XR) 500 MG 24 hr tablet   Oral   Take 500 mg by mouth 2 (two) times daily.         . metoprolol (TOPROL-XL) 50 MG 24 hr tablet   Oral   Take 50 mg by mouth daily.           Marland Kitchen  nystatin cream (MYCOSTATIN)   Topical   Apply 1 application topically 2 (two) times daily.         Marland Kitchen omeprazole (PRILOSEC) 20 MG capsule   Oral   Take 20 mg by mouth 2 (two) times daily.          . simvastatin (ZOCOR) 20 MG tablet   Oral   Take 20 mg by mouth daily.          . traZODone (DESYREL) 100 MG tablet   Oral   Take 100-300 mg by mouth at bedtime.          Marland Kitchen EXPIRED: valproic acid (DEPAKENE) 250 MG capsule   Oral   Take 1 capsule (250 mg total) by mouth 2 (two) times daily. Patient not taking: Reported on 01/18/2015   60 capsule   2     To be taken for headache.  Continue until you see  ...     Allergies Promethazine hcl; Doxycycline; Imitrex; Morphine and related; Penicillins; and Ketorolac tromethamine  Family History  Problem Relation Age of Onset  . Hypertension Mother   . Heart attack Mother   . Diabetes Mother   . Diabetes Father   .  Diabetes Brother   . Anxiety disorder Mother   . Stroke Mother   . Heart attack Mother   . Alcohol abuse      siblings  . Hypertension Brother     Social History Social History  Substance Use Topics  . Smoking status: Current Every Day Smoker -- 0.50 packs/day  . Smokeless tobacco: Never Used  . Alcohol Use: No    Review of Systems  Constitutional: Negative for fever. ENT: Negative for sore throat. Cardiovascular: Negative for chest pain. Respiratory: Negative for shortness of breath. Gastrointestinal: Negative for abdominal pain, vomiting and diarrhea. Genitourinary: Negative for dysuria. Musculoskeletal: No myalgias or injuries. Skin: Negative for rash. Neurological: Positive for left-sided headache and right-sided weakness. See history of present illness 10-point ROS otherwise negative.  ____________________________________________   PHYSICAL EXAM:  VITAL SIGNS: ED Triage Vitals  Enc Vitals Group     BP --      Pulse --      Resp --      Temp --      Temp src --      SpO2 --      Weight --      Height --      Head Cir --      Peak Flow --      Pain Score --      Pain Loc --      Pain Edu? --      Excl. in [place]? --     Constitutional:  Alert, interactive, but with some limitation and communication and cognition.Marland Kitchen ENT   Head: Normocephalic and atraumatic.   Nose: No congestion/rhinnorhea.   Mouth/Throat: Mucous membranes are moist. Cardiovascular: Bradycardic at 55, regular rhythm, no murmur noted Respiratory:  Normal respiratory effort, no tachypnea.    Breath sounds are clear and equal bilaterally.  Gastrointestinal: Soft and nontender. No distention.  Back: No muscle spasm, no tenderness, no CVA tenderness. Musculoskeletal: No deformity noted. Nontender with normal range of motion in all extremities.  No noted edema. Neurologic:  Some limitation in the details the patient can provide, but otherwise normal speech. No slurred speech. She has  noted weakness on the right side with mildly diminished right grip, and 3-4 over 5 strength in the right arm, with  3 over 5 strength in the right leg. Sensation in both right hand and right foot is diminished compared to the left. Cranial nerves II through XII appear to be intact..  Skin:  Skin is warm, dry. No rash noted. Psychiatric: Slightly odd affect, but overall communicative and appropriate..  ____________________________________________    LABS (pertinent positives/negatives)  Labs Reviewed  DIFFERENTIAL - Abnormal; Notable for the following:    Lymphs Abs 4.5 (*)    All other components within normal limits  COMPREHENSIVE METABOLIC PANEL - Abnormal; Notable for the following:    Glucose, Bld 51 (*)    All other components within normal limits  URINALYSIS COMPLETEWITH MICROSCOPIC ([facility] ONLY) - Abnormal; Notable for the following:    Color, Urine YELLOW (*)    APPearance HAZY (*)    Leukocytes, UA 1+ (*)    Squamous Epithelial / LPF 6-30 (*)    All other components within normal limits  PROTIME-INR  APTT  CBC  TROPONIN I     ____________________________________________   EKG  ED ECG REPORT I, Linzee Depaul W, the attending physician, personally viewed and interpreted this ECG.   Date: 04/06/2015  EKG Time: 1653  Rate: 74  Rhythm: Normal sinus rhythm  Axis: Left axis deviation at -33 Left ventricular hypertrophy  Intervals: Normal  ST&T Change: None noted   ____________________________________________    RADIOLOGY  CT head:   FINDINGS: The ventricles are in the midline without mass effect or shift. No extra-axial fluid collections are identified. There is a stable calcified lesion in the left frontal area which is likely a meningioma. No CT findings for acute hemispheric infarction or intracranial hemorrhage. No mass lesions are identified. Stable calcification in the mid pons. The cerebellum appears grossly normal.  No significant bony  findings. The paranasal sinuses and mastoid air cells are clear. The globes are intact.  IMPRESSION: No acute intracranial findings.  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  ____________________________________________   PROCEDURES  "Code stroke"  CRITICAL CARE Performed by: Ahmed Prima   Total critical care time: 40 minutes due to the critical nature of this patient's acute onset neurologic deficits. This included evaluation of the patient, discussion with neurology at [facility], Dr. Nicole Kindred, and initiation of consult with specialist on-call and subsequent discussion with Dr. Burgess Amor, and discussion with Dr. Genia Harold for admission..  Critical care time was exclusive of separately billable procedures and treating other patients.  Critical care was necessary to treat or prevent imminent or life-threatening deterioration.  Critical care was time spent personally by me on the following activities: development of treatment plan with patient and/or surrogate as well as nursing, discussions with consultants, evaluation of patient's response to treatment, examination of patient, obtaining history from patient or surrogate, ordering and performing treatments and interventions, ordering and review of laboratory studies, ordering and review of radiographic studies, pulse oximetry and re-evaluation of patient's condition.   ____________________________________________   INITIAL IMPRESSION / ASSESSMENT AND PLAN / ED COURSE  Pertinent labs & imaging results that were available during my care of the patient were reviewed by me and considered in my medical decision making (see chart for details).  Pleasant 68 your female in no acute distress but complains of left-sided headache and right-sided weakness. The weakness is apparent on neurologic assessment. This began at approximately 1650. Immediately upon my evaluation of the patient I initiated a code stroke. I subsequently  spoke with Dr. Nicole Kindred at [facility]. He agreed with the code stroke protocol. A  consult to specialist on-call through tele-neurology has been placed and is pending.  ----------------------------------------- 6:35 PM on 04/06/2015 -----------------------------------------  Dr. Burgess Amor of specialist on-call has evaluated the patient through tele-medicine. He reports she has an NIH score of only 2. She was able to raise both her right arm and right leg for him and hold them for 5 seconds. He advises that she should be admitted for further stroke workup but does not meet criteria for lytics.  I have discussed this with Dr. Genia Harold of the hospitalist service. She will evaluate the patient further for admission to the hospital.  ____________________________________________   FINAL CLINICAL IMPRESSION(S) / ED DIAGNOSES  Final diagnoses:  Acute CVA (cerebrovascular accident)  Acute nonintractable headache, unspecified headache type  Right sided weakness       Ahmed Prima, MD 04/06/15 1857  Ahmed Prima, MD 04/06/15 [phone]

## 2015-04-06 NOTE — ED Notes (Signed)
Pt is from [facility] states she has had weakness since yesterday however told ems it started about an hour ago. Pt also complains of headache and blurred vision.

## 2015-04-06 NOTE — H&P (Addendum)
[place] [facility] [place] NAME: Tammy Armstrong    MR#:  [id number]  DATE OF BIRTH:  07/19/1951  DATE OF ADMISSION:  04/06/2015  PRIMARY CARE PHYSICIAN: No primary care provider on file.   REQUESTING/REFERRING PHYSICIAN: Dr. Thomasene Lot  CHIEF COMPLAINT:  Right-sided weakness HISTORY OF PRESENT ILLNESS:  Tammy Armstrong  is a 64 y.o. female with a known history of CVA, COPD, diabetes and essential hypertension who presents with above complaint. Patient is from a group home and reports that this morning she was just not feeling well. She went to lie in her bed and as she approached her bed she felt weak and fell to the ground. She reports that she had right-sided weakness and cleaning her arm and leg. She is also complaining of a headache. She was evaluated by tele neuro in the emergency room and was deemed not a candidate for thrombolytics as her symptoms are improving.  PAST MEDICAL HISTORY:   Past Medical History  Diagnosis Date  . Migraines   . Hypertension   . Hepatitis C   . Anxiety   . Stroke   . Seizure 07/02/2011  . 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 Respiratory failure 07/04/2011  . COPD (chronic obstructive pulmonary disease) 07/04/2011  . Hypercholesterolemia   . Diabetes mellitus   . Diabetes mellitus 07/04/2011  . Depression     PAST SURGICAL HISTORY:   Past Surgical History  Procedure Laterality Date  . Abdominal hysterectomy    . Cholecystectomy      SOCIAL HISTORY:   Social History  Substance Use Topics  . Smoking status: Current Every Day Smoker -- 0.50 packs/day  . Smokeless tobacco: Never Used  . Alcohol Use: No    FAMILY HISTORY:   Family History  Problem Relation Age of Onset  . Hypertension Mother   . Heart attack Mother   . Diabetes Mother   . Diabetes Father   . Diabetes Brother   . Anxiety disorder Mother   . Stroke Mother   . Heart attack Mother   . Alcohol abuse      siblings  . Hypertension  Brother     DRUG ALLERGIES:   Allergies  Allergen Reactions  . Promethazine Hcl Other (See Comments)    Reaction:  Impaired speech   . Doxycycline Nausea And Vomiting  . Imitrex [Sumatriptan Base] Other (See Comments)    Pt states that it "puts her out of this world".    . Morphine And Related Nausea And Vomiting  . Penicillins Nausea And Vomiting  . Ketorolac Tromethamine Rash     REVIEW OF SYSTEMS:  CONSTITUTIONAL: No fever, fatigue ++ weakness.  EYES: No blurred or double vision.  EARS, NOSE, AND THROAT: No tinnitus or ear pain.  RESPIRATORY: No cough, shortness of breath, wheezing or hemoptysis.  CARDIOVASCULAR: No chest pain, orthopnea, edema.  GASTROINTESTINAL: No nausea, vomiting, diarrhea or abdominal pain.  GENITOURINARY: No dysuria, hematuria.  ENDOCRINE: No polyuria, nocturia,  HEMATOLOGY: No anemia, easy bruising or bleeding SKIN: No rash or lesion. MUSCULOSKELETAL: No joint pain or arthritis.   NEUROLOGIC: No tingling, numbness, +++right sided weakness. headache PSYCHIATRY: No anxiety or depression.   MEDICATIONS AT HOME:   Prior to Admission medications   Medication Sig Start Date End Date Taking? Authorizing Provider       Historical Provider, MD  clonazePAM (KLONOPIN) 1 MG tablet Take 1 mg by mouth every 8 (eight) hours.    Historical Provider, MD  DULoxetine (CYMBALTA) 60 MG capsule Take 60 mg by mouth daily.    Historical Provider, MD  gabapentin (NEURONTIN) 300 MG capsule Take 300 mg by mouth 4 (four) times daily.    Historical Provider, MD  glipiZIDE (GLUCOTROL XL) 5 MG 24 hr tablet Take 5 mg by mouth daily.    Historical Provider, MD  lisinopril (PRINIVIL,ZESTRIL) 10 MG tablet Take 10 mg by mouth daily.      Historical Provider, MD  metFORMIN (GLUCOPHAGE-XR) 500 MG 24 hr tablet Take 500 mg by mouth 2 (two) times daily.    Historical Provider, MD  metoprolol (TOPROL-XL) 50 MG 24 hr tablet Take 50 mg by mouth daily.      Historical Provider, MD        Historical Provider, MD  omeprazole (PRILOSEC) 20 MG capsule Take 20 mg by mouth 2 (two) times daily.     Historical Provider, MD  simvastatin (ZOCOR) 20 MG tablet Take 20 mg by mouth daily.     Historical Provider, MD  traZODone (DESYREL) 100 MG tablet Take 100-300 mg by mouth at bedtime.     Historical Provider, MD    07/03/11 07/02/12  Juanito Doom, MD      VITAL SIGNS:  Blood pressure 161/86, pulse 55, temperature 98.7 F (37.1 C), resp. rate 14, height 5\' 4"  (1.626 m), weight 63.504 kg (140 lb), SpO2 97 %.  PHYSICAL EXAMINATION:  GENERAL:  64 y.o.-year-old patient lying in the bed with no acute distress.  EYES: Pupils equal, round, reactive to light and accommodation. No scleral icterus. Extraocular muscles intact.  HEENT: Head atraumatic, normocephalic. Oropharynx and nasopharynx clear.  NECK:  Supple, no jugular venous distention. No thyroid enlargement, no tenderness.  LUNGS: Normal breath sounds bilaterally, no wheezing, rales,rhonchi or crepitation. No use of accessory muscles of respiration.  CARDIOVASCULAR: S1, S2 normal. No murmurs, rubs, or gallops.  ABDOMEN: Soft, nontender, nondistended. Bowel sounds present. No organomegaly or mass.  EXTREMITIES: No pedal edema, cyanosis, or clubbing.  NEUROLOGIC: Cranial nerves II through XII are grossly intact. No focal deficits. PSYCHIATRIC: The patient is alert and oriented x 3.  SKIN: No obvious rash, lesion, or ulcer.   LABORATORY PANEL:   CBC  Recent Labs Lab 04/06/15 1745  WBC 9.9  HGB 13.1  HCT 38.6  PLT 197   ------------------------------------------------------------------------------------------------------------------  Chemistries   Recent Labs Lab 04/06/15 1745  NA 140  K 3.5  CL 104  CO2 28  GLUCOSE 51*  BUN 11  CREATININE 0.73  CALCIUM 9.2  AST 27  ALT 20  ALKPHOS 51  BILITOT 1.0    ------------------------------------------------------------------------------------------------------------------  Cardiac Enzymes  Recent Labs Lab 04/06/15 1745  TROPONINI <0.03   ------------------------------------------------------------------------------------------------------------------  RADIOLOGY:  Ct Head Wo Contrast  04/06/2015   CLINICAL DATA:  Headache and blurred vision today at 4 o'clock. Right-sided weakness. History of remote pontine hemorrhage.  EXAM: CT HEAD WITHOUT CONTRAST  TECHNIQUE: Contiguous axial images were obtained from the base of the skull through the vertex without intravenous contrast.  COMPARISON:  Head CT 07/10/2014  FINDINGS: The ventricles are in the midline without mass effect or shift. No extra-axial fluid collections are identified. There is a stable calcified lesion in the left frontal area which is likely a meningioma. No CT findings for acute hemispheric infarction or intracranial hemorrhage. No mass lesions are identified. Stable calcification in the mid pons. The cerebellum appears grossly normal.  No significant bony findings. The paranasal sinuses and mastoid air cells are clear. The  globes are intact.  IMPRESSION: No acute intracranial findings.  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   Electronically Signed   By: Marijo Sanes M.D.   On: 04/06/2015 18:17    EKG:   IMPRESSION AND PLAN:  This is a 64 year old female who presents from the group home with right-sided weakness and stroke like symptoms.  1. CVA: Patient continues to have some mild right-sided weakness. Patient will be admitted to the hospital service. I will order MRI, and carotid Doppler and 2-D echocardiogram. I started aspirin and she will continue on her statin. Lipid panel will be ordered for the a.m. Neurology consultation has been placed. Patient will continue neuro checks every 4 hours. Patient will need PT and OT consultation which I have requested.  2.  Accelerated hypertension: If patient has a stroke we would like to allow blood pressure to be slightly elevated for brain perfusion. Hydralazine when necessary has been ordered.  3. Diabetes without consultation: Patient will continue on sliding scale insulin and her outpatient medications with exception of metformin which I'll hold while patient is in the hospital.  4. Tobacco dependence: Patient was encouraged to stop smoking. Patient states she wants is also taking. Patient was counseled for 3 minutes. Patient would like a nicotine patch which I will order.  5. Hyperlipidemia: Patient will continue on simvastatin. Lipid panel will be ordered for the a.m.  6. Anxiety: Patient will continue with clonazepam. Patient will need Ativan prior to her MRI which have ordered.  7. Seizure disorder:  She was taking Depakote but it appears from updated list she is no longer on this.  8. Depression: Continue Cymbalta    All the records are reviewed and case discussed with ED provider. Management plans discussed with the patient and she is in agreement.  CODE STATUS: FULL  TOTAL TIME TAKING CARE OF THIS PATIENT: 45 minutes.    Cheyenna Pankowski M.D on 04/06/2015 at 7:29 PM  Between 7am to 6pm - Pager - [phone] After 6pm go to [email or web address] - password [email or web address] [facility]  [facility]  Office  [phone]  CC: Primary care physician; No primary care provider on file.

## 2015-04-07 ENCOUNTER — Encounter: Payer: Self-pay | Admitting: Radiology

## 2015-04-07 ENCOUNTER — Inpatient Hospital Stay: Payer: Medicare Other

## 2015-04-07 ENCOUNTER — Inpatient Hospital Stay
Admit: 2015-04-07 | Discharge: 2015-04-07 | Disposition: A | Discharge status: Home / Self Care | Payer: Medicare Other | Attending: Internal Medicine | Admitting: Internal Medicine

## 2015-04-07 LAB — HEMOGLOBIN A1C: HEMOGLOBIN A1C: 5.3 % (ref 4.0–6.0)

## 2015-04-07 LAB — BASIC METABOLIC PANEL
ANION GAP: 3 — AB (ref 5–15)
BUN: 12 mg/dL (ref 6–20)
CO2: 32 mmol/L (ref 22–32)
Calcium: 8.6 mg/dL — ABNORMAL LOW (ref 8.9–10.3)
Chloride: 107 mmol/L (ref 101–111)
Creatinine, Ser: 0.79 mg/dL (ref 0.44–1.00)
GFR calc Af Amer: >60 mL/min (ref >60)
GLUCOSE: 129 mg/dL — AB (ref 65–99)
POTASSIUM: 3.5 mmol/L (ref 3.5–5.1)
Sodium: 142 mmol/L (ref 135–145)

## 2015-04-07 LAB — CBC
HEMATOCRIT: 34.2 % — AB (ref 35.0–47.0)
HEMOGLOBIN: 11.8 g/dL — AB (ref 12.0–16.0)
MCH: 32.8 pg (ref 26.0–34.0)
MCHC: 34.4 g/dL (ref 32.0–36.0)
MCV: 95.5 fL (ref 80.0–100.0)
Platelets: 157 10*3/uL (ref 150–440)
RBC: 3.59 MIL/uL — ABNORMAL LOW (ref 3.80–5.20)
RDW: 13.5 % (ref 11.5–14.5)
WBC: 7.4 10*3/uL (ref 3.6–11.0)

## 2015-04-07 LAB — GLUCOSE, CAPILLARY
GLUCOSE-CAPILLARY: 109 mg/dL — AB (ref 65–99)
GLUCOSE-CAPILLARY: 154 mg/dL — AB (ref 65–99)
Glucose-Capillary: 163 mg/dL — ABNORMAL HIGH (ref 65–99)

## 2015-04-07 LAB — MRSA PCR SCREENING: MRSA by PCR: NEGATIVE

## 2015-04-07 MED ORDER — ALPRAZOLAM 1 MG PO TABS
1.0000 mg | ORAL_TABLET | Freq: Once | ORAL | Status: AC
  Administered 2015-04-07: 1 mg via ORAL
  Filled 2015-04-07: qty 1

## 2015-04-07 MED ORDER — IOHEXOL 350 MG/ML SOLN
100.0000 mL | Freq: Once | INTRAVENOUS | Status: AC | PRN
  Administered 2015-04-07: 18:00:00 100 mL via INTRAVENOUS

## 2015-04-07 NOTE — Consult Note (Signed)
Reason for Consult: stroke Referring Physician: Dr. Narda Bonds Tammy Armstrong is an 64 y.o. female.  HPI: seen at request of Dr. Tressia Miners for possible stroke;  64 yo RHD F presents to [facility] due to R sided weakness per pt even though the chart lists L sided weakness.  Per pt, this has also included numbness and has occurred for the past 36 hours.  She does describe a headache as well but no loss of consciousness.  She states that this headache is 7/10 with some photophobia and nausea but like her typical headache that she has weekly.  She reports that headache is mostly gone now with return to baseline and no further R sided weakness.  Pt now wants to go home.  Past Medical History  Diagnosis Date  . Migraines   . Hypertension   . Hepatitis C   . Anxiety   . Stroke   . Seizure 07/02/2011  . 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 Respiratory failure 07/04/2011  . COPD (chronic obstructive pulmonary disease) 07/04/2011  . Hypercholesterolemia   . Diabetes mellitus   . Diabetes mellitus 07/04/2011  . Depression     Past Surgical History  Procedure Laterality Date  . Abdominal hysterectomy    . Cholecystectomy      Family History  Problem Relation Age of Onset  . Hypertension Mother   . Heart attack Mother   . Diabetes Mother   . Diabetes Father   . Diabetes Brother   . Anxiety disorder Mother   . Stroke Mother   . Heart attack Mother   . Alcohol abuse      siblings  . Hypertension Brother     Social History:  reports that she has been smoking.  She has never used smokeless tobacco. She reports that she does not drink alcohol or use illicit drugs.  Allergies:  Allergies  Allergen Reactions  . Promethazine Hcl Other (See Comments)    Reaction:  Impaired speech   . Doxycycline Nausea And Vomiting  . Imitrex [Sumatriptan Base] Other (See Comments)    Pt states that it "puts her out of this world".    . Morphine And Related Nausea And Vomiting  . Penicillins Nausea And  Vomiting  . Ketorolac Tromethamine Rash    Medications: personally reviewed by me as per chart  Results for orders placed or performed during the hospital encounter of 04/06/15 (from the past 48 hour(s))  Urinalysis complete, with microscopic     Status: Abnormal   Collection Time: 04/06/15  5:38 PM  Result Value Ref Range   Color, Urine YELLOW (A) YELLOW   APPearance HAZY (A) CLEAR   Glucose, UA NEGATIVE NEGATIVE mg/dL   Bilirubin Urine NEGATIVE NEGATIVE   Ketones, ur NEGATIVE NEGATIVE mg/dL   Specific Gravity, Urine 1.005 1.005 - 1.030   Hgb urine dipstick NEGATIVE NEGATIVE   pH 6.0 5.0 - 8.0   Protein, ur NEGATIVE NEGATIVE mg/dL   Nitrite NEGATIVE NEGATIVE   Leukocytes, UA 1+ (A) NEGATIVE   RBC / HPF 0-5 0 - 5 RBC/hpf   WBC, UA 0-5 0 - 5 WBC/hpf   Bacteria, UA NONE SEEN NONE SEEN   Squamous Epithelial / LPF 6-30 (A) NONE SEEN  Protime-INR     Status: None   Collection Time: 04/06/15  5:45 PM  Result Value Ref Range   Prothrombin Time 13.6 11.4 - 15.0 seconds   INR 1.02   APTT     Status: None   Collection Time:  04/06/15  5:45 PM  Result Value Ref Range   aPTT 31 24 - 36 seconds  CBC     Status: None   Collection Time: 04/06/15  5:45 PM  Result Value Ref Range   WBC 9.9 3.6 - 11.0 K/uL   RBC 4.06 3.80 - 5.20 MIL/uL   Hemoglobin 13.1 12.0 - 16.0 g/dL   HCT 38.6 35.0 - 47.0 %   MCV 95.0 80.0 - 100.0 fL   MCH 32.2 26.0 - 34.0 pg   MCHC 33.9 32.0 - 36.0 g/dL   RDW 13.3 11.5 - 14.5 %   Platelets 197 150 - 440 K/uL  Differential     Status: Abnormal   Collection Time: 04/06/15  5:45 PM  Result Value Ref Range   Neutrophils Relative % 45 %   Neutro Abs 4.4 1.4 - 6.5 K/uL   Lymphocytes Relative 45 %   Lymphs Abs 4.5 (H) 1.0 - 3.6 K/uL   Monocytes Relative 7 %   Monocytes Absolute 0.7 0.2 - 0.9 K/uL   Eosinophils Relative 2 %   Eosinophils Absolute 0.2 0 - 0.7 K/uL   Basophils Relative 1 %   Basophils Absolute 0.1 0 - 0.1 K/uL  Comprehensive metabolic panel      Status: Abnormal   Collection Time: 04/06/15  5:45 PM  Result Value Ref Range   Sodium 140 135 - 145 mmol/L   Potassium 3.5 3.5 - 5.1 mmol/L   Chloride 104 101 - 111 mmol/L   CO2 28 22 - 32 mmol/L   Glucose, Bld 51 (L) 65 - 99 mg/dL   BUN 11 6 - 20 mg/dL   Creatinine, Ser 0.73 0.44 - 1.00 mg/dL   Calcium 9.2 8.9 - 10.3 mg/dL   Total Protein 6.9 6.5 - 8.1 g/dL   Albumin 3.8 3.5 - 5.0 g/dL   AST 27 15 - 41 U/L   ALT 20 14 - 54 U/L   Alkaline Phosphatase 51 38 - 126 U/L   Total Bilirubin 1.0 0.3 - 1.2 mg/dL   GFR calc non Af Amer >60 >60 mL/min   GFR calc Af Amer >60 >60 mL/min    Comment: (NOTE) The eGFR has been calculated using the CKD EPI equation. This calculation has not been validated in all clinical situations. eGFR's persistently <60 mL/min signify possible Chronic Kidney Disease.    Anion gap 8 5 - 15  Troponin I     Status: None   Collection Time: 04/06/15  5:45 PM  Result Value Ref Range   Troponin I <0.03 <0.031 ng/mL    Comment:        NO INDICATION OF MYOCARDIAL INJURY.   Hemoglobin A1c     Status: None   Collection Time: 04/06/15  5:45 PM  Result Value Ref Range   Hgb A1c MFr Bld 5.3 4.0 - 6.0 %  Lipid panel     Status: Abnormal   Collection Time: 04/06/15  5:45 PM  Result Value Ref Range   Cholesterol 140 0 - 200 mg/dL   Triglycerides 226 (H) <150 mg/dL   HDL 34 (L) >40 mg/dL   Total CHOL/HDL Ratio 4.1 RATIO   VLDL 45 (H) 0 - 40 mg/dL   LDL Cholesterol 61 0 - 99 mg/dL    Comment:        Total Cholesterol/HDL:CHD Risk Coronary Heart Disease Risk Table                     Men  Women  1/2 Average Risk   3.4   3.3  Average Risk       5.0   4.4  2 X Average Risk   9.6   7.1  3 X Average Risk  23.4   11.0        Use the calculated Patient Ratio above and the CHD Risk Table to determine the patient's CHD Risk.        ATP III CLASSIFICATION (LDL):  <100     mg/dL   Optimal  100-129  mg/dL   Near or Above                    Optimal  130-159  mg/dL    Borderline  160-189  mg/dL   High  >190     mg/dL   Very High   Glucose, capillary     Status: Abnormal   Collection Time: 04/06/15  9:56 PM  Result Value Ref Range   Glucose-Capillary 56 (L) 65 - 99 mg/dL  Glucose, capillary     Status: None   Collection Time: 04/06/15 10:13 PM  Result Value Ref Range   Glucose-Capillary 68 65 - 99 mg/dL  Glucose, capillary     Status: None   Collection Time: 04/06/15 10:27 PM  Result Value Ref Range   Glucose-Capillary 93 65 - 99 mg/dL   Comment 1 Notify RN   MRSA PCR Screening     Status: None   Collection Time: 04/06/15 11:18 PM  Result Value Ref Range   MRSA by PCR NEGATIVE NEGATIVE    Comment:        The GeneXpert MRSA Assay (FDA approved for NASAL specimens only), is one component of a comprehensive MRSA colonization surveillance program. It is not intended to diagnose MRSA infection nor to guide or monitor treatment for MRSA infections.   Basic metabolic panel     Status: Abnormal   Collection Time: 04/07/15  5:30 AM  Result Value Ref Range   Sodium 142 135 - 145 mmol/L   Potassium 3.5 3.5 - 5.1 mmol/L   Chloride 107 101 - 111 mmol/L   CO2 32 22 - 32 mmol/L   Glucose, Bld 129 (H) 65 - 99 mg/dL   BUN 12 6 - 20 mg/dL   Creatinine, Ser 0.79 0.44 - 1.00 mg/dL   Calcium 8.6 (L) 8.9 - 10.3 mg/dL   GFR calc non Af Amer >60 >60 mL/min   GFR calc Af Amer >60 >60 mL/min    Comment: (NOTE) The eGFR has been calculated using the CKD EPI equation. This calculation has not been validated in all clinical situations. eGFR's persistently <60 mL/min signify possible Chronic Kidney Disease.    Anion gap 3 (L) 5 - 15  CBC     Status: Abnormal   Collection Time: 04/07/15  5:30 AM  Result Value Ref Range   WBC 7.4 3.6 - 11.0 K/uL   RBC 3.59 (L) 3.80 - 5.20 MIL/uL   Hemoglobin 11.8 (L) 12.0 - 16.0 g/dL   HCT 34.2 (L) 35.0 - 47.0 %   MCV 95.5 80.0 - 100.0 fL   MCH 32.8 26.0 - 34.0 pg   MCHC 34.4 32.0 - 36.0 g/dL   RDW 13.5 11.5 - 14.5  %   Platelets 157 150 - 440 K/uL  Glucose, capillary     Status: Abnormal   Collection Time: 04/07/15  8:27 AM  Result Value Ref Range   Glucose-Capillary 109 (H) 65 -  99 mg/dL  Glucose, capillary     Status: Abnormal   Collection Time: 04/07/15 11:58 AM  Result Value Ref Range   Glucose-Capillary 154 (H) 65 - 99 mg/dL    Ct Head Wo Contrast  04/06/2015   CLINICAL DATA:  Headache and blurred vision today at 4 o'clock. Right-sided weakness. History of remote pontine hemorrhage.  EXAM: CT HEAD WITHOUT CONTRAST  TECHNIQUE: Contiguous axial images were obtained from the base of the skull through the vertex without intravenous contrast.  COMPARISON:  Head CT 07/10/2014  FINDINGS: The ventricles are in the midline without mass effect or shift. No extra-axial fluid collections are identified. There is a stable calcified lesion in the left frontal area which is likely a meningioma. No CT findings for acute hemispheric infarction or intracranial hemorrhage. No mass lesions are identified. Stable calcification in the mid pons. The cerebellum appears grossly normal.  No significant bony findings. The paranasal sinuses and mastoid air cells are clear. The globes are intact.  IMPRESSION: No acute intracranial findings.  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   Electronically Signed   By: Marijo Sanes M.D.   On: 04/06/2015 18:17   Mr Brain Wo Contrast  04/07/2015   CLINICAL DATA:  Sudden onset of dizziness and falling. Unable to get back up. Right-sided weakness for 1 day. CVA. Abnormal CT scan.  EXAM: MRI HEAD WITHOUT CONTRAST  TECHNIQUE: Multiplanar, multiecho pulse sequences of the brain and surrounding structures were obtained without intravenous contrast.  COMPARISON:  CT of the head without contrast 04/06/2015.  FINDINGS: The diffusion-weighted images demonstrate no evidence for acute or subacute infarction. No acute posterior fossa process is evident. A remote infarct involving the left  occipital lobe is stable. Mild periventricular T2 changes are noted bilaterally.  Very subtle T2 changes are noted in the inferior and posterior pons to correspond with the area of linear density on the CT scan. This likely represents a capillary. The meningioma adjacent to the left frontal lobe is again noted.  Flow is present in the major intracranial arteries. The globes and orbits are intact. The paranasal sinuses and mastoid air cells are clear.  IMPRESSION: 1. No acute or focal lesion to explain the patient's acute symptoms. 2. Minimal signal changes associated with the brainstem lesion on CT. 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 3. Densely calcified left frontal meningioma is stable.   Electronically Signed   By: San Morelle M.D.   On: 04/07/2015 10:31   US Carotid Bilateral  04/07/2015   CLINICAL DATA:  CVA. History of hypertension, stroke, visual disturbance, hyperlipidemia, diabetes and smoking.  EXAM: BILATERAL CAROTID DUPLEX ULTRASOUND  TECHNIQUE: Pearline Cables scale imaging, color Doppler and duplex ultrasound were performed of bilateral carotid and vertebral arteries in the neck.  COMPARISON:  Brain MRI - 04/07/2015  FINDINGS: Criteria: Quantification of carotid stenosis is based on velocity parameters that correlate the residual internal carotid diameter with NASCET-based stenosis levels, using the diameter of the distal internal carotid lumen as the denominator for stenosis measurement.  The following velocity measurements were obtained:  RIGHT  ICA:  82/28 cm/sec  CCA:  96/28 cm/sec  SYSTOLIC ICA/CCA RATIO:  0.9  DIASTOLIC ICA/CCA RATIO:  1.2  ECA:  77 cm/sec  LEFT  ICA:  170/31 cm/sec  CCA:  36/62 cm/sec  SYSTOLIC ICA/CCA RATIO:  2.1  DIASTOLIC ICA/CCA RATIO:  1.4  ECA:  64 cm/sec  RIGHT CAROTID ARTERY: There is a  minimal amount of eccentric mixed echogenic plaque within distal aspect the right common carotid artery (image 10).  There is a minimal amount of eccentric mixed echogenic plaque within the right carotid bulb (image 13), not resulting in elevated peak systolic velocities within the interrogated course of the right internal carotid artery to suggest a hemodynamically significant stenosis.  RIGHT VERTEBRAL ARTERY:  Antegrade flow  LEFT CAROTID ARTERY: There is a moderate to large amount of eccentric mixed echogenic densely shadowing plaque within the left carotid bulb (image 45) extending to involve the origin and proximal aspects of the left internal carotid artery (image 51), resulting in elevated peak systolic velocities within the proximal and mid aspects of the left internal carotid artery (greatest acquired peak systolic velocity with the proximal ICA measures 170 cm/sec- image 53).  LEFT VERTEBRAL ARTERY:  Antegrade flow  IMPRESSION: 1. Moderate to large amount of shadowing left-sided atherosclerotic plaque results in elevated peak systolic velocities in the left internal carotid artery compatible with the 50-69% luminal narrowing range. Further evaluation with CTA could be performed as clinically indicated. 2. Minimal amount of right-sided atherosclerotic plaque, not resulting in a hemodynamically significant stenosis.   Electronically Signed   By: Sandi Mariscal M.D.   On: 04/07/2015 13:17    Review of Systems  Constitutional: Positive for malaise/fatigue. Negative for fever, chills, weight loss and diaphoresis.  HENT: Negative for ear discharge, hearing loss, nosebleeds and tinnitus.   Eyes: Negative.   Respiratory: Negative.   Cardiovascular: Negative.   Gastrointestinal: Negative.   Genitourinary: Negative.   Musculoskeletal: Negative.   Skin: Negative.   Neurological: Positive for focal weakness, weakness and headaches. Negative for dizziness, tingling, tremors, sensory change, speech change, seizures and loss of consciousness.  Psychiatric/Behavioral: Negative for depression.   Blood pressure 128/50, pulse  48, temperature 98.2 F (36.8 C), temperature source Oral, resp. rate 14, height 5' 4"  (1.626 m), weight 63.504 kg (140 lb), SpO2 99 %. Physical Exam  Nursing note and vitals reviewed. Constitutional: She appears well-developed and well-nourished. No distress.  HENT:  Head: Normocephalic and atraumatic.  Right Ear: External ear normal.  Left Ear: External ear normal.  Nose: Nose normal.  Mouth/Throat: Oropharynx is clear and moist.  Eyes: Conjunctivae and EOM are normal. Pupils are equal, round, and reactive to light. No scleral icterus.  Neck: Normal range of motion. Neck supple.  Cardiovascular: Normal rate, regular rhythm, normal heart sounds and intact distal pulses.   No murmur heard. Respiratory: Effort normal and breath sounds normal. No respiratory distress.  GI: Soft. Bowel sounds are normal. She exhibits no distension.  Musculoskeletal: Normal range of motion. She exhibits no edema.  Neurological:  A+Ox3, nl speech and language PERRLA, EOMI, nl VF, face symmetric, tongue midline 5/5 B, nl tone FTN and HTS WNL 2+/4 B, plantars mute B Nl vibration and pinprick B  Skin: Skin is warm and dry. She is not diaphoretic.  Psychiatric: She has a normal mood and affect.   MRI of brain personally reviewed by me and shows no acute infarct, minimal old white matter changes, L frontal menigioma  Assessment/Plan: 1.  Possible complicated migraine-  Pt had symptoms greater than one hour which should cause MRI changes which are not apparent.  Other differential include seizures or even lower cervical disease 2.  L carotid stenosis-  This does not appear to be symptomatic now as the above episode does not sound like a TIA but this can not be ruled out 3.  L  frontal menigioma-  Can potentially cause seizures 4.  Headache-  Chronic except for 1. -  EEG -  CTA of head and neck -  Continue ASA and statin -  Needs better BP control with goal < 130/80 -  Would benefit from vascular surgery as  in/outpatient -  Will likely start something preventative for headaches -  Will follow  Bland Rudzinski 04/07/2015, 2:28 PM

## 2015-04-07 NOTE — Progress Notes (Signed)
PT Cancellation Note  Patient Details Name: Tammy Armstrong MRN: [id number] DOB: 12-25-1950   Cancelled Treatment:    Reason Eval/Treat Not Completed: Patient declined, no reason specified ("I don't have any physical therapy needs".)  Pt adamant that she did not need physical therapy and reports she was walking earlier today to bathroom and downstairs for tests.  Pt requesting therapist leave so she could get some rest.  Will re-attempt PT eval at a later date/time per pt's willingness to participate and as needed.   Leitha Bleak 04/07/2015, 2:20 PM Leitha Bleak, [place]

## 2015-04-07 NOTE — Progress Notes (Signed)
PT Cancellation Note  Patient Details Name: Tammy Armstrong MRN: [id number] DOB: 22-Apr-1951   Cancelled Treatment:    Reason Eval/Treat Not Completed: Patient at procedure or test/unavailable.  Pt just left for imaging off floor.  Will re-attempt PT eval later today as able/as appropriate.   Raquel Sarna Hatsue Sime 04/07/2015, 9:08 AM Leitha Bleak, [place]
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
## 2015-04-07 NOTE — Progress Notes (Signed)
OT Cancellation Note  Patient Details Name: Tammy Armstrong MRN: [id number] DOB: 1950-11-13   Cancelled Treatment:    Reason Eval/Treat Not Completed: Patient at procedure or test/ unavailable. Patient is down for a test.  Sharon Mt 04/07/2015, 9:23 AM

## 2015-04-07 NOTE — Plan of Care (Signed)
Problem: Discharge/Transitional Outcomes Goal: Other Discharge Outcomes/Goals Outcome: Progressing Plan of Progress to Goal: Discharge Plan in Place and Appropriate: 1. Barriers:         Noncompliance with Care. 2. Educational Plan:         Stroke Handout Booklet Provided and Explained. 3. Hemodynamically Stable:         Monitoring Vital Signs per Stroke Protocol.          Monitoring Neuro Checks per Stroke Protocol.         Labs WDL's. 4. Independent Mobility/functioning independent or with min:         Refusing PT Evaluation.         OOB to Bathroom with standby Assist.         Refusing Bed Alarms. 5. Diet:         Healthy Heart Diet. Tolerating Well.         Tolerating Thin Liquids. 6. INR Monitor Plan Established:         Refusing SCD's. 7. PCP appointment made and transportation plan in place:         SW facilitating discharge with MD and Group Home.

## 2015-04-07 NOTE — Progress Notes (Signed)
Inpatient Diabetes Program Recommendations  AACE/ADA: New Consensus Statement on Inpatient Glycemic Control (2015)  Target Ranges:  Prepandial:   less than 140 mg/dL      Peak postprandial:   less than 180 mg/dL (1-2 hours)      Critically ill patients:  140 - 180 mg/dL   Review of Glycemic Control  Diabetes history: Type 2 Outpatient Diabetes medications: Glipizide 5mg /day, Metformin 500mg  bid Current orders for Inpatient glycemic control: Glipizide 5mg /day, Novolog 0-9 units tid and 0-5 units qhs  Inpatient Diabetes Program Recommendations:  Please re-evaluate the need for Glipizide at discharge.  A1C 5.3% and lab glucose taken last evening was 51mg /dl.  Patient may do well with just Metformin 500mg  bid .   Gentry Fitz, RN, BA, MHA, CDE Diabetes Coordinator Inpatient Diabetes Program  [phone] (Team Pager) [phone] ([place]) 04/07/2015 4:28 PM

## 2015-04-07 NOTE — Progress Notes (Signed)
*  PRELIMINARY RESULTS* Echocardiogram 2D Echocardiogram has been performed.  Tammy Armstrong 04/07/2015, 11:50 AM

## 2015-04-07 NOTE — Progress Notes (Signed)
[facility] NAME: Tammy Armstrong    MR#:  [id number]  DATE OF BIRTH:  [birth year]  SUBJECTIVE:  CHIEF COMPLAINT:   Chief Complaint  Patient presents with  . Weakness   - Admitted for several neurological complaints. Symptoms are resolving at this time. -Had the blurred vision until this morning and also right arm weakness. -For MRI and Dopplers today.  REVIEW OF SYSTEMS:  Review of Systems  Constitutional: Negative for fever and chills.  Eyes: Positive for blurred vision. Negative for double vision and photophobia.  Respiratory: Negative for cough, shortness of breath and wheezing.   Cardiovascular: Negative for chest pain and palpitations.  Gastrointestinal: Negative for nausea, vomiting, abdominal pain, diarrhea and constipation.  Genitourinary: Negative for dysuria, urgency and frequency.  Musculoskeletal: Negative for myalgias and neck pain.  Neurological: Positive for tingling, focal weakness and weakness. Negative for dizziness, sensory change, speech change, seizures and headaches.       Improving symptoms at this time    DRUG ALLERGIES:   Allergies  Allergen Reactions  . Promethazine Hcl Other (See Comments)    Reaction:  Impaired speech   . Doxycycline Nausea And Vomiting  . Imitrex [Sumatriptan Base] Other (See Comments)    Pt states that it "puts her out of this world".    . Morphine And Related Nausea And Vomiting  . Penicillins Nausea And Vomiting  . Ketorolac Tromethamine Rash    VITALS:  Blood pressure 128/50, pulse 48, temperature 98.2 F (36.8 C), temperature source Oral, resp. rate 14, height 5\' 4"  (1.626 m), weight 63.504 kg (140 lb), SpO2 99 %.  PHYSICAL EXAMINATION:  Physical Exam  GENERAL:  64 y.o.-year-old patient lying in the bed with no acute distress.  EYES: Pupils equal, round, reactive to light and accommodation. No scleral icterus. Extraocular muscles intact.  HEENT: Head  atraumatic, normocephalic. Oropharynx and nasopharynx clear.  NECK:  Supple, no jugular venous distention. No thyroid enlargement, no tenderness.  LUNGS: Normal breath sounds bilaterally, no wheezing, rales,rhonchi or crepitation. No use of accessory muscles of respiration.  CARDIOVASCULAR: S1, S2 normal. No murmurs, rubs, or gallops.  ABDOMEN: Soft, nontender, nondistended. Bowel sounds present. No organomegaly or mass.  EXTREMITIES: No pedal edema, cyanosis, or clubbing.  NEUROLOGIC: Cranial nerves II through XII are intact. Muscle strength 5/5 in all extremities. Sensation intact. Gait not checked.  PSYCHIATRIC: The patient is alert and oriented x 3.  SKIN: No obvious rash, lesion, or ulcer.    LABORATORY PANEL:   CBC  Recent Labs Lab 04/07/15 0530  WBC 7.4  HGB 11.8*  HCT 34.2*  PLT 157   ------------------------------------------------------------------------------------------------------------------  Chemistries   Recent Labs Lab 04/06/15 1745 04/07/15 0530  NA 140 142  K 3.5 3.5  CL 104 107  CO2 28 32  GLUCOSE 51* 129*  BUN 11 12  CREATININE 0.73 0.79  CALCIUM 9.2 8.6*  AST 27  --   ALT 20  --   ALKPHOS 51  --   BILITOT 1.0  --    ------------------------------------------------------------------------------------------------------------------  Cardiac Enzymes  Recent Labs Lab 04/06/15 1745  TROPONINI <0.03   ------------------------------------------------------------------------------------------------------------------  RADIOLOGY:  Ct Head Wo Contrast  04/06/2015   CLINICAL DATA:  Headache and blurred vision today at 4 o'clock. Right-sided weakness. History of remote pontine hemorrhage.  EXAM: CT HEAD WITHOUT CONTRAST  TECHNIQUE: Contiguous axial images were obtained from the base of the skull through the vertex without intravenous contrast.  COMPARISON:  Head CT 07/10/2014  FINDINGS: The ventricles are in the midline without mass effect or shift. No  extra-axial fluid collections are identified. There is a stable calcified lesion in the left frontal area which is likely a meningioma. No CT findings for acute hemispheric infarction or intracranial hemorrhage. No mass lesions are identified. Stable calcification in the mid pons. The cerebellum appears grossly normal.  No significant bony findings. The paranasal sinuses and mastoid air cells are clear. The globes are intact.  IMPRESSION: No acute intracranial findings.  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   Electronically Signed   By: Marijo Sanes M.D.   On: 04/06/2015 18:17   Mr Brain Wo Contrast  04/07/2015   CLINICAL DATA:  Sudden onset of dizziness and falling. Unable to get back up. Right-sided weakness for 1 day. CVA. Abnormal CT scan.  EXAM: MRI HEAD WITHOUT CONTRAST  TECHNIQUE: Multiplanar, multiecho pulse sequences of the brain and surrounding structures were obtained without intravenous contrast.  COMPARISON:  CT of the head without contrast 04/06/2015.  FINDINGS: The diffusion-weighted images demonstrate no evidence for acute or subacute infarction. No acute posterior fossa process is evident. A remote infarct involving the left occipital lobe is stable. Mild periventricular T2 changes are noted bilaterally.  Very subtle T2 changes are noted in the inferior and posterior pons to correspond with the area of linear density on the CT scan. This likely represents a capillary. The meningioma adjacent to the left frontal lobe is again noted.  Flow is present in the major intracranial arteries. The globes and orbits are intact. The paranasal sinuses and mastoid air cells are clear.  IMPRESSION: 1. No acute or focal lesion to explain the patient's acute symptoms. 2. Minimal signal changes associated with the brainstem lesion on CT. 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 3. Densely calcified left frontal meningioma is  stable.   Electronically Signed   By: San Morelle M.D.   On: 04/07/2015 10:31   US Carotid Bilateral  04/07/2015   CLINICAL DATA:  CVA. History of hypertension, stroke, visual disturbance, hyperlipidemia, diabetes and smoking.  EXAM: BILATERAL CAROTID DUPLEX ULTRASOUND  TECHNIQUE: Pearline Cables scale imaging, color Doppler and duplex ultrasound were performed of bilateral carotid and vertebral arteries in the neck.  COMPARISON:  Brain MRI - 04/07/2015  FINDINGS: Criteria: Quantification of carotid stenosis is based on velocity parameters that correlate the residual internal carotid diameter with NASCET-based stenosis levels, using the diameter of the distal internal carotid lumen as the denominator for stenosis measurement.  The following velocity measurements were obtained:  RIGHT  ICA:  82/28 cm/sec  CCA:  37/16 cm/sec  SYSTOLIC ICA/CCA RATIO:  0.9  DIASTOLIC ICA/CCA RATIO:  1.2  ECA:  77 cm/sec  LEFT  ICA:  170/31 cm/sec  CCA:  96/78 cm/sec  SYSTOLIC ICA/CCA RATIO:  2.1  DIASTOLIC ICA/CCA RATIO:  1.4  ECA:  64 cm/sec  RIGHT CAROTID ARTERY: There is a minimal amount of eccentric mixed echogenic plaque within distal aspect the right common carotid artery (image 10). There is a minimal amount of eccentric mixed echogenic plaque within the right carotid bulb (image 13), not resulting in elevated peak systolic velocities within the interrogated course of the right internal carotid artery to suggest a hemodynamically significant stenosis.  RIGHT VERTEBRAL ARTERY:  Antegrade flow  LEFT CAROTID ARTERY: There is a moderate to large amount of eccentric mixed echogenic densely shadowing plaque within the left carotid bulb (image  50) extending to involve the origin and proximal aspects of the left internal carotid artery (image 51), resulting in elevated peak systolic velocities within the proximal and mid aspects of the left internal carotid artery (greatest acquired peak systolic velocity with the proximal ICA measures  170 cm/sec- image 53).  LEFT VERTEBRAL ARTERY:  Antegrade flow  IMPRESSION: 1. Moderate to large amount of shadowing left-sided atherosclerotic plaque results in elevated peak systolic velocities in the left internal carotid artery compatible with the 50-69% luminal narrowing range. Further evaluation with CTA could be performed as clinically indicated. 2. Minimal amount of right-sided atherosclerotic plaque, not resulting in a hemodynamically significant stenosis.   Electronically Signed   By: Sandi Mariscal M.D.   On: 04/07/2015 13:17    EKG:   Orders placed or performed during the hospital encounter of 04/06/15  . ED EKG  . ED EKG  . EKG 12-Lead  . EKG 12-Lead    ASSESSMENT AND PLAN:   44 female with history of COPD, diabetes, CVA presents from group home with multiple neurological complaints.  #1 right-sided weakness with blurry vision-possible TIA -Symptoms are resolved now. -Patient not an aspirin at home, we'll start aspirin -Carotid Dopplers, MRI and echo done today. -Physical therapy consult. -Possible discharge in the morning. -Neurology consult -Check lipid profile as well  #2 hypertension-continue lisinopril and metoprolol  #3 diabetes mellitus-glipizide and sliding scale insulin  #4 depression and anxiety continue home medications  #5 tobacco use disorder-on nicotine patch  #6 DVT prophylaxis-Lovenox  All the records are reviewed and case discussed with Care Management/Social Workerr. Management plans discussed with the patient, family and they are in agreement.  CODE STATUS: Full code  TOTAL TIME TAKING CARE OF THIS PATIENT: 35 minutes.   POSSIBLE D/C tomorrow, DEPENDING ON CLINICAL CONDITION.   Gladstone Lighter M.D on 04/07/2015 at 2:26 PM  Between 7am to 6pm - Pager - [phone]  After 6pm go to [email or web address] - password [email or web address] [facility]  [facility]  Office  [phone]  CC: Primary care physician; No primary care provider on file.

## 2015-04-07 NOTE — Plan of Care (Addendum)
Problem: Discharge/Transitional Outcomes Goal: Barriers To Progression Addressed/Resolved Outcome: Progressing Pt is from [facility], Negative for MRSA PCR. Hx of stroke, COPD, HTN, seizure, anxiety, Hep C, depression, continue on home medications High Fall Risk Goal: Other Discharge Outcomes/Goals Outcome: Progressing Pt is alert and oriented, c/o 10/10 Headache. Norco given with no effect, Percocet given with relief. Pt scores 1 on NIH. Deficits include blurry vision, numbness and tingling on left side and less sensation on right arm and leg.

## 2015-04-07 NOTE — Progress Notes (Signed)
Patient wants to leave [facility] to group home. Dr. Leo Rod has recommended the patient to continue taking her home medications and aspirin

## 2015-04-07 NOTE — Progress Notes (Signed)
Discontinue telemetry per Dr Romero Belling

## 2015-04-08 ENCOUNTER — Inpatient Hospital Stay: Payer: Medicare Other

## 2015-04-08 DIAGNOSIS — I6529 Occlusion and stenosis of unspecified carotid artery: Secondary | ICD-10-CM

## 2015-04-08 DIAGNOSIS — G43409 Hemiplegic migraine, not intractable, without status migrainosus: Secondary | ICD-10-CM | POA: Diagnosis not present

## 2015-04-08 LAB — BASIC METABOLIC PANEL
Anion gap: 3 — ABNORMAL LOW (ref 5–15)
BUN: 14 mg/dL (ref 6–20)
CALCIUM: 8.8 mg/dL — AB (ref 8.9–10.3)
CHLORIDE: 107 mmol/L (ref 101–111)
CO2: 32 mmol/L (ref 22–32)
CREATININE: 0.9 mg/dL (ref 0.44–1.00)
GFR calc Af Amer: >60 mL/min (ref >60)
GFR calc non Af Amer: >60 mL/min (ref >60)
Glucose, Bld: 98 mg/dL (ref 65–99)
Potassium: 4.1 mmol/L (ref 3.5–5.1)
SODIUM: 142 mmol/L (ref 135–145)

## 2015-04-08 LAB — GLUCOSE, CAPILLARY
Glucose-Capillary: 69 mg/dL (ref 65–99)
Glucose-Capillary: 86 mg/dL (ref 65–99)
Glucose-Capillary: 172 mg/dL — ABNORMAL HIGH (ref 65–99)

## 2015-04-08 MED ORDER — ASPIRIN 325 MG PO TABS: 325.0000 mg | ORAL_TABLET | Freq: Every day | ORAL | Status: DC

## 2015-04-08 MED ORDER — BUDESONIDE-FORMOTEROL FUMARATE 80-4.5 MCG/ACT IN AERO: 2.0000 | INHALATION_SPRAY | Freq: Two times a day (BID) | RESPIRATORY_TRACT | Status: DC

## 2015-04-08 MED ORDER — ATORVASTATIN CALCIUM 20 MG PO TABS: 40.0000 mg | ORAL_TABLET | Freq: Every day | ORAL | Status: DC

## 2015-04-08 MED ORDER — ATORVASTATIN CALCIUM 40 MG PO TABS: 40.0000 mg | ORAL_TABLET | Freq: Every day | ORAL | Status: DC

## 2015-04-08 NOTE — Care Management Important Message (Signed)
Important Message  Patient Details  Name: Tammy Armstrong MRN: [id number] Date of Birth: [birth year]   Medicare Important Message Given:  Yes-second notification given    Juliann Pulse A Allmond 04/08/2015, 10:27 AM

## 2015-04-08 NOTE — Progress Notes (Signed)
NEUROLOGY NOTE  S:  No complaints, R side strong, wants to go home  ROS neg x 8 systems  O:  97.9   128/59    62   18 NAD, nl weight Normocephalic, oropharynx clear Supple, no JVD CTA B, no wheezing RRR, no murmurs No C/C/E  Alert and oriented x 2 not all time, nl speech and language PERRLA, EOMI, face symmetric 5/5 B, nl tone  CTA of head and neck personally reviewed by me and shows L 90% stenosis  A/P: 1.  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  L carotid stenosis-  This does not appear to be symptomatic but needs to be addressed 3.  Headaches-  Controlled now -  Start Topamax 25mg  qHS x 1 week then 25mg  BID -  Needs to f/u with Vascular sx as scheduled for L carotid -  Continue ASA 81mg  daily -  Will sign off, please call with questions -  Needs to f/u with [facility] in 4-6 weeks

## 2015-04-08 NOTE — Progress Notes (Signed)
Patient is medically stable for D/C back to [facility]. [facility] will pick patient up today shortly after 1 pm. Clinical Social Worker (CSW) prepared D/C packet and sent D/C Summary and FL2 to [employer]. Patient and RN are aware of above. Please reconsult if future social work needs arise. CSW signing off.   Blima Rich, [place] [phone]

## 2015-04-08 NOTE — Progress Notes (Signed)
MD making rounds. Discharge orders received. Tammy Armstrong, Social Worker facilitating transfer to [facility]. IV removed. VSS. No s/s of distress noted. Denies pain. Denies needs. Awaiting arrival of [name], [place] [employer] blower/designer for transportation to facility. Discharge packet hand delivered to [facility]. No unanswered questions. Escorted via wheelchair by nursing staff. All belongings sent with patient.

## 2015-04-08 NOTE — Evaluation (Signed)
Physical Therapy Evaluation Patient Details Name: Tammy Armstrong MRN: [id number] DOB: 1951/05/18 Today's Date: 04/08/2015   History of Present Illness  Pt is a 64 y.o. female presenting to hospital with R sided weakness, HA, and blurry vision; pt fell to ground trying to get to her bed.  CT head negative (but shows stable L frontal meningioma and pontine calcification).  MRI of head negative.  PMH includes CVA, COPD, diabetes, essential htn, Hep C, stroke, pontine hemmorhage.  Clinical Impression  Currently pt demonstrates impairments with balance.  Prior to admission, pt was independent without AD and walked the track with friends.  Pt lives at a group home.  Currently pt is SBA with ambulation (pt with increased BOS and lateral sway but pt reports this is her baseline gait mechanics).  Pt steady with ambulation though (no loss of balance with ambulation and head turns R/L/up/down and increasing/decreasing speed) but does have some higher level balance impairments; discussed HHPT with pt but pt declining at this time and reports this is her baseline level of functioning  Pt would benefit from skilled PT to address above noted impairments and functional limitations during her hospital stay to ensure safe discharge.  Recommend pt discharge back to group home when medically appropriate.     Follow Up Recommendations Home health PT (HHPT for higher level balance (pt refusing HHPT at this time))    Equipment Recommendations       Recommendations for Other Services       Precautions / Restrictions Precautions Precautions: Fall Restrictions Weight Bearing Restrictions: No      Mobility  Bed Mobility Overal bed mobility: Modified Independent (HOB elevated)                Transfers Overall transfer level: Independent Equipment used: None             General transfer comment: steady without loss of balance  Ambulation/Gait Ambulation/Gait assistance: Supervision Ambulation  Distance (Feet): 200 Feet Assistive device: None     Gait velocity interpretation: >2.62 ft/sec, indicative of independent community ambulator General Gait Details: increased BOS and lateral sway (pt reports this is her baseline walking pattern); no loss of balance noted  Stairs            Wheelchair Mobility    Modified Rankin (Stroke Patients Only)       Balance Overall balance assessment: Needs assistance Sitting-balance support: No upper extremity supported;Feet supported Sitting balance-Leahy Scale: Normal     Standing balance support: No upper extremity supported Standing balance-Leahy Scale: Good                   Standardized Balance Assessment Standardized Balance Assessment :  (Tinetti balance assessment:  1/2 standing balance; 1/2 nudged;  0/1 walking time; 1/2 trunk.  Pt scored 24/28 indicating pt is at low risk for falls)           Pertinent Vitals/Pain Pain Assessment: 0-10 Pain Score: 3  Pain Location: headache Pain Descriptors / Indicators: Constant Pain Intervention(s): Limited activity within patient's tolerance;Monitored during session  Vitals stable and WFL throughout treatment session (see flowsheet for HR and O2 vitals).    Home Living Family/patient expects to be discharged to:: Group home                 Additional Comments: Pt reports no steps at group home.    Prior Function Level of Independence: Independent         Comments: Walks around  track with friends.     Hand Dominance        Extremity/Trunk Assessment   Upper Extremity Assessment: Overall WFL for tasks assessed           Lower Extremity Assessment: Overall WFL for tasks assessed         Communication   Communication: No difficulties  Cognition Arousal/Alertness: Awake/alert Behavior During Therapy: WFL for tasks assessed/performed Overall Cognitive Status: Within Functional Limits for tasks assessed                       General Comments   Nursing cleared pt for participation in physical therapy.  Pt agreeable to PT session.    Exercises        Assessment/Plan    PT Assessment Patient needs continued PT services  PT Diagnosis Other (comment) (Balance impairments)   PT Problem List Decreased balance  PT Treatment Interventions Gait training;Functional mobility training;Therapeutic activities;Therapeutic exercise;Balance training;Patient/family education   PT Goals (Current goals can be found in the Care Plan section) Acute Rehab PT Goals Patient Stated Goal: To go home PT Goal Formulation: With patient Time For Goal Achievement: 04/22/15 Potential to Achieve Goals: Good    Frequency Min 2X/week   Barriers to discharge        Co-evaluation               End of Session Equipment Utilized During Treatment: Gait belt Activity Tolerance: Patient tolerated treatment well;No increased pain Patient left: in bed;with call bell/phone within reach;with bed alarm set Nurse Communication: Mobility status         Time: 4920-1007 PT Time Calculation (min) (ACUTE ONLY): 13 min   Charges:   PT Evaluation $Initial PT Evaluation Tier I: 1 Procedure     PT G CodesLeitha Bleak 2015-04-09, 9:52 AM Leitha Bleak, [place]

## 2015-04-08 NOTE — Plan of Care (Signed)
Problem: Discharge/Transitional Outcomes Goal: Barriers To Progression Addressed/Resolved Outcome: Completed/Met Date Met:  04/08/15 Patient calm and cooperative throughout shift.  She was educated on stroke protocol and is agreement with continuation of tests and assessments this shift.   Goal: Other Discharge Outcomes/Goals Individualization of Care Pt prefers to be called Tammy Armstrong Hx of CVA, COPD, HTN, Hep C, anxiety, seizure and depression  Education:  Ongoing stroke education given this shift. Hemodynamically:  Patient afebrile and VSS this shift. BP 172/52 mmHg  Pulse 47  Temp(Src) 98.2 F (36.8 C) (Oral)  Resp 18  Ht 5' 4"  (1.626 m)  Wt 140 lb (63.504 kg)  BMI 24.02 kg/m2  SpO2 99% Mobility:  Patient strength assessed and then assisted to bathroom.  Patient up with steady gait.  Stand-by assist and bed alarm on throughout shift for safety. Diet:  Patient did not have meal during shift. Discharge Plan:  Patient in agreement to care plan and discharge back to [facility] at discharge. Medication:  Patient stated that she is able to obtain medications and that [facility] will assist with any needs she may have.

## 2015-04-08 NOTE — Discharge Summary (Signed)
[facility] [place] NAME: Tammy Armstrong    MR#:  [id number]  DATE OF BIRTH:  February 08, 1951  DATE OF ADMISSION:  04/06/2015 ADMITTING PHYSICIAN: Bettey Costa, MD  DATE OF DISCHARGE: 04/08/2015  PRIMARY CARE PHYSICIAN: No primary care provider on file.    ADMISSION DIAGNOSIS:  Right sided weakness [M62.89] Acute CVA (cerebrovascular accident) [I63.9] Acute nonintractable headache, unspecified headache type [R51]  DISCHARGE DIAGNOSIS:  Principal Problem:   Migraine Active Problems:   Carotid stenosis   SECONDARY DIAGNOSIS:   Past Medical History  Diagnosis Date  . Migraines   . Hypertension   . Hepatitis C   . Anxiety   . Stroke   . Seizure 07/02/2011  . 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 Respiratory failure 07/04/2011  . COPD (chronic obstructive pulmonary disease) 07/04/2011  . Hypercholesterolemia   . Diabetes mellitus   . Diabetes mellitus 07/04/2011  . Depression     HOSPITAL COURSE:    55 female with history of COPD, diabetes, CVA presents from group home with multiple neurological complaints.  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 -Appreciate neurology consult -Patient not an aspirin at home, so started on aspirin -MRI with no acute infarct. Carotid Dopplers with left sided 80-90% blockage and normal echo -Physical therapy consulted. Home health physical therapy recommended, but patient refusing at this time -Lipid profile with LDL of 61. Changed to atorvastatin.  #2 left carotid stenosis-with prior right CVA according to patient, no active symptoms at this time. -Discussed with Dr.Dew from vascular and also Dr. Tamala Julian from neurology. -Anti-platelet treatment recommended an outpatient follow-up recommended. - appointment taken for next week  #3 hypertension-continue lisinopril and metoprolol  #4 diabetes  mellitus-glipizide  #4 depression and anxiety continue home medications  #5 tobacco use -counseled against smoking  Patient will be discharged today back to group home  DISCHARGE CONDITIONS:   Stable  CONSULTS OBTAINED:  Treatment Team:  Valora Corporal, MD  DRUG ALLERGIES:   Allergies  Allergen Reactions  . Promethazine Hcl Other (See Comments)    Reaction:  Impaired speech   . Doxycycline Nausea And Vomiting  . Imitrex [Sumatriptan Base] Other (See Comments)    Pt states that it "puts her out of this world".    . Morphine And Related Nausea And Vomiting  . Penicillins Nausea And Vomiting  . Ketorolac Tromethamine Rash    DISCHARGE MEDICATIONS:   Current Discharge Medication List    START taking these medications   Details  aspirin 325 MG tablet Take 1 tablet (325 mg total) by mouth daily. Qty: 30 tablet, Refills: 2    atorvastatin (LIPITOR) 40 MG tablet Take 1 tablet (40 mg total) by mouth daily at 6 PM. Qty: 30 tablet, Refills: 2      CONTINUE these medications which have CHANGED   Details  budesonide-formoterol (SYMBICORT) 80-4.5 MCG/ACT inhaler Inhale 2 puffs into the lungs 2 (two) times daily. Qty: 1 Inhaler, Refills: 2      CONTINUE these medications which have NOT CHANGED   Details  acetaminophen (TYLENOL) 325 MG tablet Take 650 mg by mouth every 6 (six) hours as needed for mild pain or headache.    albuterol (PROVENTIL HFA;VENTOLIN HFA) 108 (90 BASE) MCG/ACT inhaler Inhale 2 puffs into the lungs every 6 (six) hours as needed for wheezing or shortness of breath.     clonazePAM (KLONOPIN) 1 MG  tablet Take 1 mg by mouth 3 (three) times daily.     docusate sodium (COLACE) 100 MG capsule Take 100 mg by mouth 2 (two) times daily as needed for mild constipation.    DULoxetine (CYMBALTA) 30 MG capsule Take 90 mg by mouth daily.    gabapentin (NEURONTIN) 300 MG capsule Take 300 mg by mouth 4 (four) times daily.    glipiZIDE (GLUCOTROL XL) 5 MG 24 hr tablet  Take 5 mg by mouth daily.    lisinopril (PRINIVIL,ZESTRIL) 20 MG tablet Take 20 mg by mouth daily.    metFORMIN (GLUCOPHAGE) 500 MG tablet Take 500 mg by mouth 2 (two) times daily.    metoprolol (TOPROL-XL) 50 MG 24 hr tablet Take 50 mg by mouth daily.      omeprazole (PRILOSEC) 20 MG capsule Take 20 mg by mouth 2 (two) times daily.     traZODone (DESYREL) 100 MG tablet Take 100 mg by mouth at bedtime.       STOP taking these medications     ibuprofen (ADVIL,MOTRIN) 200 MG tablet      simvastatin (ZOCOR) 20 MG tablet          DISCHARGE INSTRUCTIONS:   1. PCP follow-up in 1-2 weeks 2. Vascular follow-up in 1 week 3. Neurology follow-up in 1-2 weeks  If you experience worsening of your admission symptoms, develop shortness of breath, life threatening emergency, suicidal or homicidal thoughts you must seek medical attention immediately by calling 911 or calling your MD immediately  if symptoms less severe.  You Must read complete instructions/literature along with all the possible adverse reactions/side effects for all the Medicines you take and that have been prescribed to you. Take any new Medicines after you have completely understood and accept all the possible adverse reactions/side effects.   Please note  You were cared for by a hospitalist during your hospital stay. If you have any questions about your discharge medications or the care you received while you were in the hospital after you are discharged, you can call the unit and asked to speak with the hospitalist on call if the hospitalist that took care of you is not available. Once you are discharged, your primary care physician will handle any further medical issues. Please note that NO REFILLS for any discharge medications will be authorized once you are discharged, as it is imperative that you return to your primary care physician (or establish a relationship with a primary care physician if you do not have one) for your  aftercare needs so that they can reassess your need for medications and monitor your lab values.    Today   CHIEF COMPLAINT:   Chief Complaint  Patient presents with  . Weakness    VITAL SIGNS:  Blood pressure 145/53, pulse 58, temperature 98 F (36.7 C), temperature source Oral, resp. rate 18, height 5\' 4"  (1.626 m), weight 63.504 kg (140 lb), SpO2 99 %.  I/O:   Intake/Output Summary (Last 24 hours) at 04/08/15 1043 Last data filed at 04/08/15 0900  Gross per 24 hour  Intake    480 ml  Output    600 ml  Net   -120 ml    PHYSICAL EXAMINATION:   Physical Exam  GENERAL: 64 y.o.-year-old patient lying in the bed with no acute distress.  EYES: Pupils equal, round, reactive to light and accommodation. No scleral icterus. Extraocular muscles intact.  HEENT: Head atraumatic, normocephalic. Oropharynx and nasopharynx clear.  NECK: Supple, no jugular venous distention.  No thyroid enlargement, no tenderness.  LUNGS: Normal breath sounds bilaterally, no wheezing, rales,rhonchi or crepitation. No use of accessory muscles of respiration.  CARDIOVASCULAR: S1, S2 normal. No murmurs, rubs, or gallops.  ABDOMEN: Soft, nontender, nondistended. Bowel sounds present. No organomegaly or mass.  EXTREMITIES: No pedal edema, cyanosis, or clubbing.  NEUROLOGIC: Cranial nerves II through XII are intact. Muscle strength 5/5 in all extremities. Sensation intact. Gait not checked.  PSYCHIATRIC: The patient is alert and oriented x 3.  SKIN: No obvious rash, lesion, or ulcer.   DATA REVIEW:   CBC  Recent Labs Lab 04/07/15 0530  WBC 7.4  HGB 11.8*  HCT 34.2*  PLT 157    Chemistries   Recent Labs Lab 04/06/15 1745  04/08/15 0500  NA 140  < > 142  K 3.5  < > 4.1  CL 104  < > 107  CO2 28  < > 32  GLUCOSE 51*  < > 98  BUN 11  < > 14  CREATININE 0.73  < > 0.90  CALCIUM 9.2  < > 8.8*  AST 27  --   --   ALT 20  --   --   ALKPHOS 51  --   --   BILITOT 1.0  --   --   < >  = values in this interval not displayed.  Cardiac Enzymes  Recent Labs Lab 04/06/15 1745  TROPONINI <0.03    Microbiology Results  Results for orders placed or performed during the hospital encounter of 04/06/15  MRSA PCR Screening     Status: None   Collection Time: 04/06/15 11:18 PM  Result Value Ref Range Status   MRSA by PCR NEGATIVE NEGATIVE Final    Comment:        The GeneXpert MRSA Assay (FDA approved for NASAL specimens only), is one component of a comprehensive MRSA colonization surveillance program. It is not intended to diagnose MRSA infection nor to guide or monitor treatment for MRSA infections.     RADIOLOGY:  Ct Angio Head W/cm &/or Wo Cm  04/07/2015   CLINICAL DATA:  Transient right-sided weakness. Headache with photophobia.  EXAM: CT ANGIOGRAPHY HEAD AND NECK  TECHNIQUE: Multidetector CT imaging of the head and neck was performed using the standard protocol during bolus administration of intravenous contrast. Multiplanar CT image reconstructions and MIPs were obtained to evaluate the vascular anatomy. Carotid stenosis measurements (when applicable) are obtained utilizing NASCET criteria, using the distal internal carotid diameter as the denominator.  CONTRAST:  159mL OMNIPAQUE IOHEXOL 350 MG/ML SOLN  COMPARISON:  MRI brain earlier today.  FINDINGS: CT HEAD  Calvarium and skull base: No fracture or destructive lesion. Mastoids and middle ears are grossly clear.  Paranasal sinuses: Imaged portions are clear.  Orbits: Negative.  Brain: No evidence of acute abnormality, including acute infarct, hemorrhage, or hydrocephalus. Densely calcified LEFT frontal meningioma 12 mm diameter, slight mass effect on the underlying brain without edema. 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 Chronic LEFT occipital infarct.  CTA NECK  Aortic arch: Normal variant branching, with LEFT vertebral originating directly from the arch.  Imaged portion shows no evidence of aneurysm or dissection. No significant stenosis of the major arch vessel origins.  Right carotid system: Extensive soft plaque involves the distal common carotid artery and could serve as a source of emboli. There is calcified and noncalcified posterior wall plaque, within the bulb and proximal RIGHT internal carotid artery, non stenotic based on measurements of  2.6/3.9 proximal/ distal. No definite ulceration. No dissection or distal occlusion.  Left carotid system: Distal common carotid artery unremarkable. Severe predominantly calcified plaque at the LEFT ICA origin, estimated 80-90% stenosis based on measurements of 0.5/3.4 proximal/distal. Diminished caliber of the cervical ICA could contribute to artifactual low stenosis measurement due to beginning reduction of flow.  Vertebral arteries: Codominant No evidence of dissection, stenosis (50% or greater) or occlusion.  Nonvascular tissues: Spondylosis C6-C7. No pulmonary nodule or pneumothorax. Tiny left thyroid calcification, non worrisome. No adenopathy. No osseous findings. Edentulous.  CTA HEAD  Anterior circulation: No significant stenosis, proximal occlusion, aneurysm, or vascular malformation.  Posterior circulation: No significant stenosis, proximal occlusion, aneurysm, or vascular malformation.  Venous sinuses: As permitted by contrast timing, patent.  Anatomic variants: None of significance.  Delayed phase:   No abnormal intracranial enhancement.  IMPRESSION: Flow-limiting stenosis, primarily calcific, proximal LEFT internal carotid artery. 80-90% stenosis may be artifactually low due to diminished caliber of the LEFT cervical ICA.  Non stenotic extracranial disease involving the RIGHT common carotid and RIGHT internal carotid arteries.  No intracranial flow reducing lesion is evident.  Incidental heavily calcified LEFT frontal meningioma.  Incidental enhancing mid pontine lesion, favor capillary telangectasia.  A  call is into the ordering provider.   Electronically Signed   By: Staci Righter M.D.   On: 04/07/2015 19:10   Ct Head Wo Contrast  04/06/2015   CLINICAL DATA:  Headache and blurred vision today at 4 o'clock. Right-sided weakness. History of remote pontine hemorrhage.  EXAM: CT HEAD WITHOUT CONTRAST  TECHNIQUE: Contiguous axial images were obtained from the base of the skull through the vertex without intravenous contrast.  COMPARISON:  Head CT 07/10/2014  FINDINGS: The ventricles are in the midline without mass effect or shift. No extra-axial fluid collections are identified. There is a stable calcified lesion in the left frontal area which is likely a meningioma. No CT findings for acute hemispheric infarction or intracranial hemorrhage. No mass lesions are identified. Stable calcification in the mid pons. The cerebellum appears grossly normal.  No significant bony findings. The paranasal sinuses and mastoid air cells are clear. The globes are intact.  IMPRESSION: No acute intracranial findings.  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   Electronically Signed   By: Marijo Sanes M.D.   On: 04/06/2015 18:17   Ct Angio Neck W/cm &/or Wo/cm  04/07/2015   CLINICAL DATA:  Transient right-sided weakness. Headache with photophobia.  EXAM: CT ANGIOGRAPHY HEAD AND NECK  TECHNIQUE: Multidetector CT imaging of the head and neck was performed using the standard protocol during bolus administration of intravenous contrast. Multiplanar CT image reconstructions and MIPs were obtained to evaluate the vascular anatomy. Carotid stenosis measurements (when applicable) are obtained utilizing NASCET criteria, using the distal internal carotid diameter as the denominator.  CONTRAST:  177mL OMNIPAQUE IOHEXOL 350 MG/ML SOLN  COMPARISON:  MRI brain earlier today.  FINDINGS: CT HEAD  Calvarium and skull base: No fracture or destructive lesion. Mastoids and middle ears are grossly clear.  Paranasal sinuses: Imaged  portions are clear.  Orbits: Negative.  Brain: No evidence of acute abnormality, including acute infarct, hemorrhage, or hydrocephalus. Densely calcified LEFT frontal meningioma 12 mm diameter, slight mass effect on the underlying brain without edema. 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 Chronic LEFT occipital infarct.  CTA NECK  Aortic arch: Normal variant branching, with LEFT vertebral originating directly from the arch. Imaged portion shows no evidence of aneurysm  or dissection. No significant stenosis of the major arch vessel origins.  Right carotid system: Extensive soft plaque involves the distal common carotid artery and could serve as a source of emboli. There is calcified and noncalcified posterior wall plaque, within the bulb and proximal RIGHT internal carotid artery, non stenotic based on measurements of 2.6/3.9 proximal/ distal. No definite ulceration. No dissection or distal occlusion.  Left carotid system: Distal common carotid artery unremarkable. Severe predominantly calcified plaque at the LEFT ICA origin, estimated 80-90% stenosis based on measurements of 0.5/3.4 proximal/distal. Diminished caliber of the cervical ICA could contribute to artifactual low stenosis measurement due to beginning reduction of flow.  Vertebral arteries: Codominant No evidence of dissection, stenosis (50% or greater) or occlusion.  Nonvascular tissues: Spondylosis C6-C7. No pulmonary nodule or pneumothorax. Tiny left thyroid calcification, non worrisome. No adenopathy. No osseous findings. Edentulous.  CTA HEAD  Anterior circulation: No significant stenosis, proximal occlusion, aneurysm, or vascular malformation.  Posterior circulation: No significant stenosis, proximal occlusion, aneurysm, or vascular malformation.  Venous sinuses: As permitted by contrast timing, patent.  Anatomic variants: None of significance.  Delayed phase:   No abnormal intracranial  enhancement.  IMPRESSION: Flow-limiting stenosis, primarily calcific, proximal LEFT internal carotid artery. 80-90% stenosis may be artifactually low due to diminished caliber of the LEFT cervical ICA.  Non stenotic extracranial disease involving the RIGHT common carotid and RIGHT internal carotid arteries.  No intracranial flow reducing lesion is evident.  Incidental heavily calcified LEFT frontal meningioma.  Incidental enhancing mid pontine lesion, favor capillary telangectasia.  A call is into the ordering provider.   Electronically Signed   By: Staci Righter M.D.   On: 04/07/2015 19:10   Mr Brain Wo Contrast  04/07/2015   CLINICAL DATA:  Sudden onset of dizziness and falling. Unable to get back up. Right-sided weakness for 1 day. CVA. Abnormal CT scan.  EXAM: MRI HEAD WITHOUT CONTRAST  TECHNIQUE: Multiplanar, multiecho pulse sequences of the brain and surrounding structures were obtained without intravenous contrast.  COMPARISON:  CT of the head without contrast 04/06/2015.  FINDINGS: The diffusion-weighted images demonstrate no evidence for acute or subacute infarction. No acute posterior fossa process is evident. A remote infarct involving the left occipital lobe is stable. Mild periventricular T2 changes are noted bilaterally.  Very subtle T2 changes are noted in the inferior and posterior pons to correspond with the area of linear density on the CT scan. This likely represents a capillary. The meningioma adjacent to the left frontal lobe is again noted.  Flow is present in the major intracranial arteries. The globes and orbits are intact. The paranasal sinuses and mastoid air cells are clear.  IMPRESSION: 1. No acute or focal lesion to explain the patient's acute symptoms. 2. Minimal signal changes associated with the brainstem lesion on CT. 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 3. Densely calcified left frontal meningioma is stable.    Electronically Signed   By: San Morelle M.D.   On: 04/07/2015 10:31   US Carotid Bilateral  04/07/2015   CLINICAL DATA:  CVA. History of hypertension, stroke, visual disturbance, hyperlipidemia, diabetes and smoking.  EXAM: BILATERAL CAROTID DUPLEX ULTRASOUND  TECHNIQUE: Pearline Cables scale imaging, color Doppler and duplex ultrasound were performed of bilateral carotid and vertebral arteries in the neck.  COMPARISON:  Brain MRI - 04/07/2015  FINDINGS: Criteria: Quantification of carotid stenosis is based on velocity parameters that correlate the residual internal carotid diameter with NASCET-based stenosis  levels, using the diameter of the distal internal carotid lumen as the denominator for stenosis measurement.  The following velocity measurements were obtained:  RIGHT  ICA:  82/28 cm/sec  CCA:  74/12 cm/sec  SYSTOLIC ICA/CCA RATIO:  0.9  DIASTOLIC ICA/CCA RATIO:  1.2  ECA:  77 cm/sec  LEFT  ICA:  170/31 cm/sec  CCA:  87/86 cm/sec  SYSTOLIC ICA/CCA RATIO:  2.1  DIASTOLIC ICA/CCA RATIO:  1.4  ECA:  64 cm/sec  RIGHT CAROTID ARTERY: There is a minimal amount of eccentric mixed echogenic plaque within distal aspect the right common carotid artery (image 10). There is a minimal amount of eccentric mixed echogenic plaque within the right carotid bulb (image 13), not resulting in elevated peak systolic velocities within the interrogated course of the right internal carotid artery to suggest a hemodynamically significant stenosis.  RIGHT VERTEBRAL ARTERY:  Antegrade flow  LEFT CAROTID ARTERY: There is a moderate to large amount of eccentric mixed echogenic densely shadowing plaque within the left carotid bulb (image 45) extending to involve the origin and proximal aspects of the left internal carotid artery (image 51), resulting in elevated peak systolic velocities within the proximal and mid aspects of the left internal carotid artery (greatest acquired peak systolic velocity with the proximal ICA measures 170  cm/sec- image 53).  LEFT VERTEBRAL ARTERY:  Antegrade flow  IMPRESSION: 1. Moderate to large amount of shadowing left-sided atherosclerotic plaque results in elevated peak systolic velocities in the left internal carotid artery compatible with the 50-69% luminal narrowing range. Further evaluation with CTA could be performed as clinically indicated. 2. Minimal amount of right-sided atherosclerotic plaque, not resulting in a hemodynamically significant stenosis.   Electronically Signed   By: Sandi Mariscal M.D.   On: 04/07/2015 13:17    EKG:   Orders placed or performed during the hospital encounter of 04/06/15  . ED EKG  . ED EKG  . EKG 12-Lead  . EKG 12-Lead      Management plans discussed with the patient, family and they are in agreement.  CODE STATUS:     Code Status Orders        Start     Ordered   04/06/15 2124  Full code   Continuous     04/06/15 2123      TOTAL TIME TAKING CARE OF THIS PATIENT: 38  minutes.    Gladstone Lighter M.D on 04/08/2015 at 10:43 AM  Between 7am to 6pm - Pager - [phone]  After 6pm go to [email or web address] - password [email or web address]  [facility]  Office  [phone]  CC: Primary care physician; No primary care provider on file.

## 2015-04-29 ENCOUNTER — Emergency Department
Admission: EM | Admit: 2015-04-29 | Discharge: 2015-04-29 | Discharge status: Against Medical Advice | Payer: Medicare Other | Attending: Emergency Medicine | Admitting: Emergency Medicine

## 2015-04-29 ENCOUNTER — Emergency Department: Payer: Medicare Other

## 2015-04-29 ENCOUNTER — Encounter: Payer: Self-pay | Admitting: Emergency Medicine

## 2015-04-29 DIAGNOSIS — I10 Essential (primary) hypertension: Secondary | ICD-10-CM | POA: Insufficient documentation

## 2015-04-29 DIAGNOSIS — E119 Type 2 diabetes mellitus without complications: Secondary | ICD-10-CM | POA: Insufficient documentation

## 2015-04-29 DIAGNOSIS — R51 Headache: Secondary | ICD-10-CM | POA: Diagnosis not present

## 2015-04-29 DIAGNOSIS — Z880 Allergy status to penicillin: Secondary | ICD-10-CM | POA: Diagnosis not present

## 2015-04-29 DIAGNOSIS — Z720 Tobacco use: Secondary | ICD-10-CM | POA: Diagnosis not present

## 2015-04-29 DIAGNOSIS — R202 Paresthesia of skin: Secondary | ICD-10-CM | POA: Insufficient documentation

## 2015-04-29 DIAGNOSIS — R531 Weakness: Secondary | ICD-10-CM | POA: Diagnosis present

## 2015-04-29 DIAGNOSIS — R519 Headache, unspecified: Secondary | ICD-10-CM

## 2015-04-29 LAB — CBC
HCT: 37.8 % (ref 35.0–47.0)
HEMOGLOBIN: 12.9 g/dL (ref 12.0–16.0)
MCH: 32.8 pg (ref 26.0–34.0)
MCHC: 34.2 g/dL (ref 32.0–36.0)
MCV: 96.1 fL (ref 80.0–100.0)
PLATELETS: 194 10*3/uL (ref 150–440)
RBC: 3.94 MIL/uL (ref 3.80–5.20)
RDW: 12.8 % (ref 11.5–14.5)
WBC: 10.7 10*3/uL (ref 3.6–11.0)

## 2015-04-29 LAB — BASIC METABOLIC PANEL
ANION GAP: 7 (ref 5–15)
BUN: 14 mg/dL (ref 6–20)
CALCIUM: 9 mg/dL (ref 8.9–10.3)
CO2: 28 mmol/L (ref 22–32)
CREATININE: 0.67 mg/dL (ref 0.44–1.00)
Chloride: 106 mmol/L (ref 101–111)
Glucose, Bld: 91 mg/dL (ref 65–99)
Potassium: 3.6 mmol/L (ref 3.5–5.1)
SODIUM: 141 mmol/L (ref 135–145)

## 2015-04-29 MED ORDER — MAGNESIUM SULFATE IN D5W 10-5 MG/ML-% IV SOLN
1.0000 g | Freq: Once | INTRAVENOUS | Status: DC
  Filled 2015-04-29: qty 100

## 2015-04-29 MED ORDER — KETOROLAC TROMETHAMINE 30 MG/ML IJ SOLN
30.0000 mg | Freq: Once | INTRAMUSCULAR | Status: AC
  Administered 2015-04-29: 30 mg via INTRAVENOUS
  Filled 2015-04-29: qty 1

## 2015-04-29 MED ORDER — METOCLOPRAMIDE HCL 5 MG/ML IJ SOLN
10.0000 mg | Freq: Once | INTRAMUSCULAR | Status: AC
  Administered 2015-04-29: 10 mg via INTRAVENOUS
  Filled 2015-04-29: qty 2

## 2015-04-29 NOTE — ED Provider Notes (Signed)
[facility] Emergency Department Provider Note     Time seen: ----------------------------------------- 7:08 PM on 04/29/2015 -----------------------------------------    I have reviewed the triage vital signs and the nursing notes.   HISTORY  Chief Complaint Weakness    HPI Tammy Armstrong is a 64 y.o. female who presents to ER with left-sided weakness that started about 2 PM. Is currently 7 PM. She presents 5 hours after the onset of her symptoms. Does have left-sided weakness. Patient reports previous stroke history right-sided weakness and she currently has a blockage in the left carotid artery that is pending surgery. She denies any pain, describes left-sided facial paresthesias.   Past Medical History  Diagnosis Date  . Migraines   . Hypertension   . Hepatitis C   . Anxiety   . Stroke ([place])   . Seizure ([place]) 07/02/2011  . 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 Respiratory failure ([place]) 07/04/2011  . COPD (chronic obstructive pulmonary disease) ([place]) 07/04/2011  . Hypercholesterolemia   . Diabetes mellitus   . Diabetes mellitus 07/04/2011  . Depression     Patient Active Problem List   Diagnosis Date Noted  . Carotid stenosis 04/08/2015  . Migraine 04/06/2015  . Dementia with behavioral disturbance 01/19/2015  . Diabetes mellitus 07/04/2011  . Anxiety 07/04/2011  . COPD (chronic obstructive pulmonary disease) ([place]) 07/04/2011  . Purulent bronchitis ([place]) 07/04/2011  . 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 Abdominal hysterectomy    . Cholecystectomy      Allergies Promethazine hcl; Doxycycline; Imitrex; Morphine and related; Penicillins; and Ketorolac tromethamine  Social History Social History  Substance Use Topics  . Smoking status: Current Every Day Smoker -- 0.50 packs/day  . Smokeless tobacco: Never Used  . Alcohol Use: No    Review of Systems Constitutional:  Negative for fever. Eyes: Negative for visual changes. ENT: Negative for sore throat. Cardiovascular: Negative for chest pain. Respiratory: Negative for shortness of breath. Gastrointestinal: Negative for abdominal pain, vomiting and diarrhea. Genitourinary: Negative for dysuria. Musculoskeletal: Negative for back pain. Skin: Negative for rash. Neurological: Negative for headaches, positive for left arm and left leg weakness. Positive for paresthesias in the left side of her face  10-point ROS otherwise negative.  ____________________________________________   PHYSICAL EXAM:  VITAL SIGNS: ED Triage Vitals  Enc Vitals Group     BP 04/29/15 1844 159/69 mmHg     Pulse Rate 04/29/15 1844 55     Resp 04/29/15 1844 14     Temp 04/29/15 1844 98.6 F (37 C)     Temp Source 04/29/15 1844 Oral     SpO2 04/29/15 1844 100 %     Weight 04/29/15 1844 160 lb (72.576 kg)     Height 04/29/15 1844 5\' 2"  (1.575 m)     Head Cir --      Peak Flow --      Pain Score 04/29/15 1844 9     Pain Loc --      Pain Edu? --      Excl. in [place]? --     Constitutional: Alert and oriented. Well appearing and in no distress. Eyes: Conjunctivae are normal. PERRL. Normal extraocular movements. ENT   Head: Normocephalic and atraumatic.   Nose: No congestion/rhinnorhea.   Mouth/Throat: Mucous membranes are moist.   Neck: No stridor. Cardiovascular: Normal rate, regular rhythm. Normal and symmetric distal pulses are present in all extremities. No murmurs, rubs, or gallops.  Respiratory: Normal respiratory effort without tachypnea nor retractions. Breath sounds are clear and equal bilaterally. No wheezes/rales/rhonchi. Gastrointestinal: Soft and nontender. No distention. No abdominal bruits.  Musculoskeletal: Nontender with normal range of motion in all extremities. No joint effusions.  No lower extremity tenderness nor edema. Neurologic:  Normal speech and language. Left upper extremity and left  lower extremity weakness compared to right, there are paresthesias on the left side of her face. Skin:  Skin is warm, dry and intact. No rash noted. Psychiatric: Mood and affect are normal. Speech and behavior are normal. Patient exhibits appropriate insight and judgment. ____________________________________________  EKG: Interpreted by me. Sinus bradycardia with first-degree AV block, late R-wave progression, normal axis. No evidence of acute infarction.  ____________________________________________  ED COURSE:  Pertinent labs & imaging results that were available during my care of the patient were reviewed by me and considered in my medical decision making (see chart for details). Patient is in no acute distress, does appear to have subacute neurologic symptoms.  I discussed the case with Dr. 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 Patient recently admitted and worked up for same. She is given IV Toradol, Reglan, magnesium. Patient requesting Demerol or Dilaudid for pain. ____________________________________________    LABS (pertinent positives/negatives)  Labs Reviewed  BASIC METABOLIC PANEL  CBC  CBG MONITORING, ED    RADIOLOGY Images were viewed by me  CT head IMPRESSION: No acute intracranial pathology. Chronic changes. ____________________________________________  FINAL ASSESSMENT AND PLAN   Left-sided weakness, headache  Plan: Patient with labs and imaging as dictated above. Patient states "I want this shit out of my arm". Patient very angry that she did not receive either Demerol or Dilaudid. Patient is advised that we have started treatment for her migraine with magnesium, Toradol and Reglan. Patient is leaving against advice, states she is not concerned about her weakness. Patient is advised to stay for further neurologic workup. I have previously discussed with a neurologist. Patient states she does not care she just wants to leave as  soon as possible. Patient continues to curse at me as she leaves the hospital.   Earleen Newport, MD   Earleen Newport, MD 04/29/15 2035

## 2015-04-29 NOTE — ED Notes (Signed)
Patient transported to CT 

## 2015-04-29 NOTE — Discharge Instructions (Signed)
Acknowledgement of Risk of Discharge    Against Medical Advice     And Release of Liability        Because I am choosing to leave the hospital in spite of these risks, I release the hospital, its employees and officers, and my attending physician from all liability for any adverse results caused by my leaving the hospital prematurely.      ________________________________      _____________________________________  Patient's Signature                                        Date and Time      ________________________________      _____________________________________  Witness' Signature                                        Relationship to Patient        ________________________________      _____________________________________  Witness' Signature                                        Relationship to Patient      [If patient refuses to sign, write "Patient refused to sign" on the patient's signature line and have witnesses to the refusal sign as witnesses.]

## 2015-04-29 NOTE — ED Notes (Addendum)
Patient able to give address for group home. Unable to call report to group home due to group home not answering phone. Patient is smiling, pleasant, was sleeping soundly. Denies pain at this time.

## 2015-04-29 NOTE — ED Notes (Signed)
Pt to ed via [facility] with c/o left sided weakness started today. Pt had a stroke 2 weeks ago with left sided deficits. Pt resides in a care home at this time. Pt also has an 80% carotid blockage on the left side and has an appt sch with surgeon on Monday.  Ems reports all vital signs wnl.  Pt arrives to ed a/ox3 with headache 9/10.

## 2015-04-29 NOTE — ED Notes (Signed)
Several attempts made to call numbers provided by patient and voice mails were left. Group home was directly contacted and this writer was told that she couldn't leave the other clients to come pick up this patient.

## 2015-04-29 NOTE — ED Notes (Signed)
This writer was unable to est iv access but was able to obtain labs ordered.

## 2015-04-29 NOTE — ED Notes (Signed)
Patient is agitated, states she knows the medications is not going to work. Patient is irritable, demanding to see the MD.

## 2015-05-02 ENCOUNTER — Ambulatory Visit (INDEPENDENT_AMBULATORY_CARE_PROVIDER_SITE_OTHER): Payer: Medicare Other | Admitting: Cardiovascular Disease

## 2015-05-02 ENCOUNTER — Encounter: Payer: Self-pay | Admitting: Cardiovascular Disease

## 2015-05-02 ENCOUNTER — Telehealth: Payer: Self-pay

## 2015-05-02 VITALS — BP 108/62 | HR 53 | Ht 62.0 in | Wt 161.2 lb

## 2015-05-02 DIAGNOSIS — I6522 Occlusion and stenosis of left carotid artery: Secondary | ICD-10-CM

## 2015-05-02 DIAGNOSIS — R079 Chest pain, unspecified: Secondary | ICD-10-CM | POA: Diagnosis not present

## 2015-05-02 DIAGNOSIS — I639 Cerebral infarction, unspecified: Secondary | ICD-10-CM

## 2015-05-02 DIAGNOSIS — R0602 Shortness of breath: Secondary | ICD-10-CM | POA: Diagnosis not present

## 2015-05-02 NOTE — Progress Notes (Signed)
Cardiology Office Note   Date:  05/02/2015   ID:  Tammy Armstrong, DOB September 21, 1950, MRN [id number]  PCP:  Gayland Curry, MD  Cardiologist:   Thayer Headings, MD   Chief Complaint  Patient presents with  . other    Ref by Dr. Lucky Cowboy for cardiac evaluation for pre op for carotid surgery. Pt. c/o chest pain and shortness of breath.    Problem list 1. Carotid artery disease 2. Dyspnea with exertion 3. Hyperlipidemia 4. DM - type 2 5. COPD     History of Present Illness: Tammy Armstrong is a 64 y.o. female who presents for evaluation prior to having carotid surgery  Has occasional palipitations. Has occasional shooting chest pain . Only last for a second. Has had them for several years Exercises regulaly. 3 times a week  [place] minutes [place] the [place].  No CP or dyspnea with walking     Past Medical History  Diagnosis Date  . Migraines   . Hypertension   . Hepatitis C   . Anxiety   . Stroke ([place])   . Seizure ([place]) 07/02/2011  . 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 Respiratory failure ([place]) 07/04/2011  . COPD (chronic obstructive pulmonary disease) ([place]) 07/04/2011  . Hypercholesterolemia   . Diabetes mellitus   . Diabetes mellitus 07/04/2011  . Depression   . Carotid artery occlusion     Past Surgical History  Procedure Laterality Date  . Abdominal hysterectomy    . Cholecystectomy       Current Outpatient Prescriptions  Medication Sig Dispense Refill  . acetaminophen (TYLENOL) 325 MG tablet Take 650 mg by mouth every 6 (six) hours as needed for mild pain or headache.    . albuterol (PROVENTIL HFA;VENTOLIN HFA) 108 (90 BASE) MCG/ACT inhaler Inhale 2 puffs into the lungs every 6 (six) hours as needed for wheezing or shortness of breath.     Marland Kitchen aspirin 325 MG tablet Take 1 tablet (325 mg total) by mouth daily. 30 tablet 2  . atorvastatin (LIPITOR) 40 MG tablet Take 1 tablet (40 mg total) by mouth daily at 6 PM. 30 tablet 2  . budesonide-formoterol  (SYMBICORT) 80-4.5 MCG/ACT inhaler Inhale 2 puffs into the lungs 2 (two) times daily. 1 Inhaler 2  . clonazePAM (KLONOPIN) 1 MG tablet Take 1 mg by mouth 3 (three) times daily.     Marland Kitchen docusate sodium (COLACE) 100 MG capsule Take 100 mg by mouth 2 (two) times daily as needed for mild constipation.    . DULoxetine (CYMBALTA) 30 MG capsule Take 90 mg by mouth daily.    Marland Kitchen gabapentin (NEURONTIN) 300 MG capsule Take 300 mg by mouth 4 (four) times daily.    Marland Kitchen glipiZIDE (GLUCOTROL XL) 5 MG 24 hr tablet Take 5 mg by mouth daily.    Marland Kitchen lisinopril (PRINIVIL,ZESTRIL) 20 MG tablet Take 20 mg by mouth daily.    . metFORMIN (GLUCOPHAGE) 500 MG tablet Take 500 mg by mouth 2 (two) times daily with a meal.     . metoprolol (TOPROL-XL) 50 MG 24 hr tablet Take 50 mg by mouth daily.      Marland Kitchen omeprazole (PRILOSEC) 20 MG capsule Take 20 mg by mouth 2 (two) times daily.     . traZODone (DESYREL) 100 MG tablet Take 100 mg by mouth at bedtime.      No current facility-administered medications for this visit.    Allergies:   Promethazine hcl; Ciprofloxacin; Doxycycline; Imitrex; Morphine and related; Penicillins;  Zantac; and Ketorolac tromethamine    Social History:  The patient  reports that she has been smoking.  She has never used smokeless tobacco. She reports that she does not drink alcohol or use illicit drugs.   Family History:  The patient's family history includes Alcohol abuse in an other family member; Anxiety disorder in her mother; Diabetes in her brother, father, and mother; Heart attack in her mother and mother; Hypertension in her brother and mother; Stroke in her mother.    ROS:  Please see the history of present illness.    Review of Systems: Constitutional:  denies fever, chills, diaphoresis, appetite change and fatigue.  HEENT: denies photophobia, eye pain, redness, hearing loss, ear pain, congestion, sore throat, rhinorrhea, sneezing, neck pain, neck stiffness and tinnitus.  Respiratory: denies  SOB, DOE, cough, chest tightness, and wheezing.  Cardiovascular: denies chest pain, palpitations and leg swelling.  Gastrointestinal: denies nausea, vomiting, abdominal pain, diarrhea, constipation, blood in stool.  Genitourinary: denies dysuria, urgency, frequency, hematuria, flank pain and difficulty urinating.  Musculoskeletal: denies  myalgias, back pain, joint swelling, arthralgias and gait problem.   Skin: denies pallor, rash and wound.  Neurological: denies dizziness, seizures, syncope, weakness, light-headedness, numbness and headaches.   Hematological: denies adenopathy, easy bruising, personal or family bleeding history.  Psychiatric/ Behavioral: denies suicidal ideation, mood changes, confusion, nervousness, sleep disturbance and agitation.       All other systems are reviewed and negative.    PHYSICAL EXAM: VS:  BP 108/62 mmHg  Pulse 53  Ht 5\' 2"  (1.575 m)  Wt 73.143 kg (161 lb 4 oz)  BMI 29.49 kg/m2 , BMI Body mass index is 29.49 kg/(m^2). GEN: Well nourished, well developed, in no acute distress HEENT: normal Neck: no JVD, carotid bruits, or masses Cardiac: RRR; no murmurs, rubs, or gallops,no edema  Respiratory:  clear to auscultation bilaterally, normal work of breathing GI: soft, nontender, nondistended, + BS MS: no deformity or atrophy Skin: warm and dry, no rash Neuro:  Strength and sensation are intact Psych: normal   EKG:  EKG is ordered today. The ekg ordered today demonstrates sinus brady at 53.  Otherwise normal ECG    Recent Labs: 04/06/2015: ALT 20 04/29/2015: BUN 14; Creatinine, Ser 0.67; Hemoglobin 12.9; Platelets 194; Potassium 3.6; Sodium 141    Lipid Panel    Component Value Date/Time   CHOL 140 04/06/2015 1745   CHOL 118 07/11/2014 0546   TRIG 226* 04/06/2015 1745   TRIG 169 07/11/2014 0546   HDL 34* 04/06/2015 1745   HDL 28* 07/11/2014 0546   CHOLHDL 4.1 04/06/2015 1745   VLDL 45* 04/06/2015 1745   VLDL 34 07/11/2014 0546    LDLCALC 61 04/06/2015 1745   LDLCALC 56 07/11/2014 0546      Wt Readings from Last 3 Encounters:  05/02/15 73.143 kg (161 lb 4 oz)  04/29/15 72.576 kg (160 lb)  04/06/15 63.504 kg (140 lb)      Other studies Reviewed: Additional studies/ records that were reviewed today include: . Review of the above records demonstrates:    ASSESSMENT AND PLAN:  1.  Carotid artery stenosis: Preoperative evaluation prior to carotid artery surgery. She has some atypical chest pains. She walks 30 minutes a day. We will schedule her for a stress Myoview study for preoperative evaluation. I'll see her back on an as-needed basis assuming that the stress Myoview study is normal. If the stress Myoview study is abnormal, then we will need to see her back  for a cardiac catheterization.  2. Hyperlipidemia: This is being managed by her primary medical doctor.  Current medicines are reviewed at length with the patient today.  The patient does not have concerns regarding medicines.  The following changes have been made:  no change  Labs/ tests ordered today include:  Orders Placed This Encounter  Procedures  . EKG 12-Lead     Disposition:   FU with me as needed.      Tammy Armstrong, Tammy Cheng, MD  05/02/2015 9:28 AM    [address], [place], [place]  [address] Phone: [phone]; Fax: [phone]   [facility]  [address] [place], [place]  [address] [phone]   Fax [phone]

## 2015-05-02 NOTE — Patient Instructions (Addendum)
Medication Instructions:  Your physician recommends that you continue on your current medications as directed. Please refer to the Current Medication list given to you today.   Labwork: none  Testing/Procedures: Your physician has requested that you have a lexiscan myoview. For further information please visit [email or web address]. Please follow instruction sheet, as given.  [place]  Your caregiver has ordered a Stress Test with nuclear imaging. The purpose of this test is to evaluate the blood supply to your heart muscle. This procedure is referred to as a "Non-Invasive Stress Test." This is because other than having an IV started in your vein, nothing is inserted or "invades" your body. Cardiac stress tests are done to find areas of poor blood flow to the heart by determining the extent of coronary artery disease (CAD). Some patients exercise on a treadmill, which naturally increases the blood flow to your heart, while others who are  unable to walk on a treadmill due to physical limitations have a pharmacologic/chemical stress agent called Lexiscan . This medicine will mimic walking on a treadmill by temporarily increasing your coronary blood flow.   Please note: these test may take anywhere between 2-4 hours to complete  PLEASE REPORT TO [place] WILL DIRECT YOU WHERE TO GO  Date of Procedure: Tuesday, Oct 18, 9:00am Arrival Time for Procedure: 8:30am Instructions regarding medication:   __xx__ : Hold glipizide medication 24 hours before test; hold metformin 24 hours before AND 48 hours after procedure  _xx___:  Hold metoprolol night before procedure and morning of procedure    PLEASE NOTIFY THE OFFICE AT LEAST 24 HOURS IN ADVANCE IF YOU ARE UNABLE TO KEEP YOUR APPOINTMENT.  [phone] AND  PLEASE NOTIFY NUCLEAR MEDICINE AT [facility] AT LEAST 24 HOURS IN ADVANCE IF YOU ARE UNABLE TO KEEP YOUR APPOINTMENT. [phone]  How to  prepare for your Myoview test:   Do not eat or drink after midnight  No caffeine for 24 hours prior to test  No smoking 24 hours prior to test.  Your medication may be taken with water.  If your doctor stopped a medication because of this test, do not take that medication.  Ladies, please do not wear dresses.  Skirts or pants are appropriate. Please wear a short sleeve shirt.  No perfume, cologne or lotion.  Wear comfortable walking shoes. No heels!            Follow-Up: Your physician recommends that you schedule a follow-up appointment as needed with Dr. Acie Fredrickson   Any Other Special Instructions Will Be Listed Below (If Applicable).  Cardiac Nuclear Scanning A cardiac nuclear scan is used to check your heart for problems, such as the following:  A portion of the heart is not getting enough blood.  Part of the heart muscle has died, which happens with a heart attack.  The heart wall is not working normally.  In this test, a radioactive dye (tracer) is injected into your bloodstream. After the tracer has traveled to your heart, a scanning device is used to measure how much of the tracer is absorbed by or distributed to various areas of your heart. LET [facility] CARE PROVIDER KNOW ABOUT:  Any allergies you have.  All medicines you are taking, including vitamins, herbs, eye drops, creams, and over-the-counter medicines.  Previous problems you or members of your family have had with the use of anesthetics.  Any blood disorders you have.  Previous surgeries you have had.  Medical conditions you have.  RISKS AND COMPLICATIONS Generally, this is a safe procedure. However, as with any procedure, problems can occur. Possible problems include:   Serious chest pain.  Rapid heartbeat.  Sensation of warmth in your chest. This usually passes quickly. BEFORE THE PROCEDURE Ask your health care provider about changing or stopping your regular medicines. PROCEDURE This  procedure is usually done at a hospital and takes 2-4 hours.  An IV tube is inserted into one of your veins.  Your health care provider will inject a small amount of radioactive tracer through the tube.  You will then wait for 20-40 minutes while the tracer travels through your bloodstream.  You will lie down on an exam table so images of your heart can be taken. Images will be taken for about 15-20 minutes.  You will exercise on a treadmill or stationary bike. While you exercise, your heart activity will be monitored with an electrocardiogram (ECG), and your blood pressure will be checked.  If you are unable to exercise, you may be given a medicine to make your heart beat faster.  When blood flow to your heart has peaked, tracer will again be injected through the IV tube.  After 20-40 minutes, you will get back on the exam table and have more images taken of your heart.  When the procedure is over, your IV tube will be removed. AFTER THE PROCEDURE  You will likely be able to leave shortly after the test. Unless your health care provider tells you otherwise, you may return to your normal schedule, including diet, activities, and medicines.  Make sure you find out how and when you will get your test results.   This information is not intended to replace advice given to you by your health care provider. Make sure you discuss any questions you have with your health care provider.   Document Released: 08/03/2004 Document Revised: 07/14/2013 Document Reviewed: 06/17/2013 Elsevier Interactive Patient Education [employer].

## 2015-05-02 NOTE — Telephone Encounter (Signed)
S/w Mickel Baas, nurse at [facility] who states pt needs left carotid endarterectomy. It is not yet scheduled as they are awaiting cardiac clearance. Pt is here in office for appt. Mickel Baas states she will fax forms over to [place] now.

## 2015-05-06 ENCOUNTER — Telehealth: Payer: Self-pay

## 2015-05-06 NOTE — Telephone Encounter (Signed)
Review of myoview instructions:  Called number provided by pt [phone], forwarded to fax line.  Called Barrington Ellison (on DPI) who states she is the director at the group home where pt resides. She confirmed number as working and will call group home to have them answer. Tammy Armstrong will provide transportation for pt but need to review instructions w/pt.  S/w Tammy Armstrong at group home who states she is pt's caregiver and would like review of information. Reviewed instructions. Emphasized no glipizide and metformin 24 hours before procedure. Reviewed restart for meds. Hold metoprolol morning of test. Provided CB number if questions.  [name] verbalized understanding w/no questions.

## 2015-05-10 ENCOUNTER — Encounter
Admission: RE | Admit: 2015-05-10 | Discharge: 2015-05-10 | Disposition: A | Discharge status: Home / Self Care | Payer: Medicare Other | Source: Ambulatory Visit | Attending: Cardiovascular Disease | Admitting: Cardiovascular Disease

## 2015-05-10 DIAGNOSIS — R079 Chest pain, unspecified: Secondary | ICD-10-CM | POA: Insufficient documentation

## 2015-05-10 DIAGNOSIS — R0602 Shortness of breath: Secondary | ICD-10-CM | POA: Insufficient documentation

## 2015-05-10 LAB — NM MYOCAR MULTI W/SPECT W/WALL MOTION / EF
CHL CUP NUCLEAR SSS: 1
CHL CUP RESTING HR STRESS: 54 {beats}/min
CSEPPHR: 89 {beats}/min
LV dias vol: 62 mL
LVSYSVOL: 22 mL
Percent HR: 56 %
SDS: 0
SRS: 1
TID: 0.92

## 2015-05-10 MED ORDER — REGADENOSON 0.4 MG/5ML IV SOLN
0.4000 mg | Freq: Once | INTRAVENOUS | Status: AC
  Administered 2015-05-10: 0.4 mg via INTRAVENOUS

## 2015-05-10 MED ORDER — TECHNETIUM TC 99M SESTAMIBI - CARDIOLITE
10.0000 | Freq: Once | INTRAVENOUS | Status: AC | PRN
  Administered 2015-05-10: 14.07 via INTRAVENOUS

## 2015-05-10 MED ORDER — TECHNETIUM TC 99M SESTAMIBI - CARDIOLITE
30.0000 | Freq: Once | INTRAVENOUS | Status: AC | PRN
  Administered 2015-05-10: 10:00:00 31.93 via INTRAVENOUS

## 2015-05-12 ENCOUNTER — Other Ambulatory Visit: Payer: Self-pay | Admitting: Vascular Surgery

## 2015-05-13 ENCOUNTER — Encounter
Admission: RE | Admit: 2015-05-13 | Discharge: 2015-05-13 | Disposition: A | Discharge status: Home / Self Care | Payer: Medicare Other | Source: Ambulatory Visit | Attending: Vascular Surgery | Admitting: Vascular Surgery

## 2015-05-13 DIAGNOSIS — Z01818 Encounter for other preprocedural examination: Secondary | ICD-10-CM | POA: Insufficient documentation

## 2015-05-13 DIAGNOSIS — I6529 Occlusion and stenosis of unspecified carotid artery: Secondary | ICD-10-CM | POA: Insufficient documentation

## 2015-05-13 HISTORY — DX: Reserved for inherently not codable concepts without codable children: IMO0001

## 2015-05-13 HISTORY — DX: Gastro-esophageal reflux disease without esophagitis: K21.9

## 2015-05-13 HISTORY — DX: Unspecified osteoarthritis, unspecified site: M19.90

## 2015-05-13 HISTORY — DX: Nicotine dependence, cigarettes, uncomplicated: F17.210

## 2015-05-13 HISTORY — DX: Acute pancreatitis without necrosis or infection, unspecified: K85.90

## 2015-05-13 LAB — CBC WITH DIFFERENTIAL/PLATELET
Basophils Absolute: 0 10*3/uL (ref 0–0.1)
Basophils Relative: 1 %
EOS PCT: 4 %
Eosinophils Absolute: 0.3 10*3/uL (ref 0–0.7)
HCT: 38.5 % (ref 35.0–47.0)
Hemoglobin: 12.9 g/dL (ref 12.0–16.0)
LYMPHS PCT: 39 %
Lymphs Abs: 2.8 10*3/uL (ref 1.0–3.6)
MCH: 31.9 pg (ref 26.0–34.0)
MCHC: 33.6 g/dL (ref 32.0–36.0)
MCV: 94.9 fL (ref 80.0–100.0)
MONO ABS: 0.4 10*3/uL (ref 0.2–0.9)
Monocytes Relative: 6 %
NEUTROS ABS: 3.7 10*3/uL (ref 1.4–6.5)
Neutrophils Relative %: 52 %
PLATELETS: 186 10*3/uL (ref 150–440)
RBC: 4.05 MIL/uL (ref 3.80–5.20)
RDW: 13.1 % (ref 11.5–14.5)
WBC: 7.2 10*3/uL (ref 3.6–11.0)

## 2015-05-13 LAB — TYPE AND SCREEN
ABO/RH(D): O POS
Antibody Screen: NEGATIVE

## 2015-05-13 LAB — PROTIME-INR
INR: 1.03
Prothrombin Time: 13.7 seconds (ref 11.4–15.0)

## 2015-05-13 LAB — ABO/RH: ABO/RH(D): O POS

## 2015-05-13 LAB — BASIC METABOLIC PANEL
ANION GAP: 4 — AB (ref 5–15)
BUN: 15 mg/dL (ref 6–20)
CALCIUM: 9.3 mg/dL (ref 8.9–10.3)
CO2: 32 mmol/L (ref 22–32)
Chloride: 106 mmol/L (ref 101–111)
Creatinine, Ser: 0.84 mg/dL (ref 0.44–1.00)
GFR calc Af Amer: >60 mL/min (ref >60)
GLUCOSE: 144 mg/dL — AB (ref 65–99)
POTASSIUM: 4.8 mmol/L (ref 3.5–5.1)
SODIUM: 142 mmol/L (ref 135–145)

## 2015-05-13 LAB — APTT: APTT: 28 s (ref 24–36)

## 2015-05-13 LAB — SURGICAL PCR SCREEN
MRSA, PCR: NEGATIVE
STAPHYLOCOCCUS AUREUS: NEGATIVE

## 2015-05-13 NOTE — Patient Instructions (Signed)
  Your procedure is scheduled on: May 18, 2015(Wednesday) Report to Day Surgery. To find out your arrival time please call [phone] between 1PM - 3PM on May 17, 2015(Tuesday).  Remember: Instructions that are not followed completely may result in serious medical risk, up to and including death, or upon the discretion of your surgeon and anesthesiologist your surgery may need to be rescheduled.    __x__ 1. Do not eat food or drink liquids after midnight. No gum chewing or hard candies.     ____ 2. No Alcohol for 24 hours before or after surgery.   ____ 3. Bring all medications with you on the day of surgery if instructed.    __x__ 4. Notify your doctor if there is any change in your medical condition     (cold, fever, infections).     Do not wear jewelry, make-up, hairpins, clips or nail polish.  Do not wear lotions, powders, or perfumes. You may wear deodorant.  Do not shave 48 hours prior to surgery. Men may shave face and neck.  Do not bring valuables to the hospital.    [facility] is not responsible for any belongings or valuables.               Contacts, dentures or bridgework may not be worn into surgery.  Leave your suitcase in the car. After surgery it may be brought to your room.  For patients admitted to the hospital, discharge time is determined by your                treatment team.   Patients discharged the day of surgery will not be allowed to drive home.   Please read over the following fact sheets that you were given:   MRSA Information and Surgical Site Infection Prevention   ____ Take these medicines the morning of surgery with A SIP OF WATER:    1. Omeprazole  2. Metoprolol  3. Lisinopril  4.  5.  6.  ____ Fleet Enema (as directed)   __x__ Use CHG Soap as directed  __x__ Use inhalers on the day of surgery(Use Symbicort and Albuterol inhalers the day of surgery and bring to hospital)   __x__ Stop metformin 2 days prior to surgery(Stop  Metformin on October 24, DO NOT GIVE ON  Monday, Tuesday, or Wednesday)  ____ Take 1/2 of usual insulin dose the night before surgery and none on the morning of surgery.   __x__ Stop Coumadin/Plavix/aspirin on (DO NOT GIVE ASPIRIN THE DAY OF SURGERY)  ____ Stop Anti-inflammatories on    ____ Stop supplements until after surgery.    ____ Bring C-Pap to the hospital.

## 2015-05-17 MED ORDER — VANCOMYCIN HCL 10 G IV SOLR
1500.0000 mg | INTRAVENOUS | Status: AC
  Administered 2015-05-18: 1500 mg via INTRAVENOUS
  Filled 2015-05-17: qty 1500

## 2015-05-18 ENCOUNTER — Inpatient Hospital Stay
Admission: RE | Admit: 2015-05-18 | Discharge: 2015-05-19 | DRG: 039 | Disposition: A | Discharge status: Home / Self Care | Payer: Medicare Other | Source: Ambulatory Visit | Attending: Vascular Surgery | Admitting: Vascular Surgery

## 2015-05-18 ENCOUNTER — Inpatient Hospital Stay: Payer: Medicare Other | Admitting: Anesthesiology

## 2015-05-18 ENCOUNTER — Encounter
Admission: RE | Disposition: A | Discharge status: Home / Self Care | Payer: Self-pay | Source: Ambulatory Visit | Attending: Vascular Surgery

## 2015-05-18 ENCOUNTER — Encounter: Payer: Self-pay | Admitting: *Deleted

## 2015-05-18 DIAGNOSIS — E7800 Pure hypercholesterolemia, unspecified: Secondary | ICD-10-CM | POA: Diagnosis present

## 2015-05-18 DIAGNOSIS — E119 Type 2 diabetes mellitus without complications: Secondary | ICD-10-CM | POA: Diagnosis present

## 2015-05-18 DIAGNOSIS — B1920 Unspecified viral hepatitis C without hepatic coma: Secondary | ICD-10-CM | POA: Diagnosis present

## 2015-05-18 DIAGNOSIS — G451 Carotid artery syndrome (hemispheric): Secondary | ICD-10-CM | POA: Diagnosis present

## 2015-05-18 DIAGNOSIS — K219 Gastro-esophageal reflux disease without esophagitis: Secondary | ICD-10-CM | POA: Diagnosis present

## 2015-05-18 DIAGNOSIS — G459 Transient cerebral ischemic attack, unspecified: Secondary | ICD-10-CM | POA: Diagnosis present

## 2015-05-18 DIAGNOSIS — I10 Essential (primary) hypertension: Secondary | ICD-10-CM | POA: Diagnosis present

## 2015-05-18 DIAGNOSIS — J449 Chronic obstructive pulmonary disease, unspecified: Secondary | ICD-10-CM | POA: Diagnosis present

## 2015-05-18 DIAGNOSIS — I6529 Occlusion and stenosis of unspecified carotid artery: Secondary | ICD-10-CM | POA: Diagnosis present

## 2015-05-18 DIAGNOSIS — M1990 Unspecified osteoarthritis, unspecified site: Secondary | ICD-10-CM | POA: Diagnosis present

## 2015-05-18 DIAGNOSIS — Z720 Tobacco use: Secondary | ICD-10-CM | POA: Diagnosis not present

## 2015-05-18 DIAGNOSIS — F329 Major depressive disorder, single episode, unspecified: Secondary | ICD-10-CM | POA: Diagnosis present

## 2015-05-18 HISTORY — PX: ENDARTERECTOMY: SHX5162

## 2015-05-18 LAB — GLUCOSE, CAPILLARY: Glucose-Capillary: 162 mg/dL — ABNORMAL HIGH (ref 65–99)

## 2015-05-18 SURGERY — ENDARTERECTOMY, CAROTID
Anesthesia: General | Laterality: Left | Wound class: Clean

## 2015-05-18 MED ORDER — METOPROLOL SUCCINATE ER 50 MG PO TB24
50.0000 mg | ORAL_TABLET | Freq: Every day | ORAL | Status: DC
  Administered 2015-05-18 – 2015-05-19 (×2): 50 mg via ORAL
  Filled 2015-05-18 (×2): qty 1

## 2015-05-18 MED ORDER — GLYCOPYRROLATE 0.2 MG/ML IJ SOLN
INTRAMUSCULAR | Status: DC | PRN
  Administered 2015-05-18: 0.6 mg via INTRAVENOUS

## 2015-05-18 MED ORDER — POTASSIUM CHLORIDE CRYS ER 20 MEQ PO TBCR
20.0000 meq | EXTENDED_RELEASE_TABLET | Freq: Every day | ORAL | Status: DC | PRN
Start: 2015-05-18 — End: 2015-05-19

## 2015-05-18 MED ORDER — ONDANSETRON HCL 4 MG/2ML IJ SOLN: 4.0000 mg | Freq: Once | INTRAMUSCULAR | Status: DC | PRN

## 2015-05-18 MED ORDER — MAGNESIUM SULFATE 2 GM/50ML IV SOLN
2.0000 g | Freq: Every day | INTRAVENOUS | Status: DC | PRN
  Filled 2015-05-18: qty 50

## 2015-05-18 MED ORDER — NITROGLYCERIN IN D5W 200-5 MCG/ML-% IV SOLN
INTRAVENOUS | Status: AC
  Filled 2015-05-18: qty 250

## 2015-05-18 MED ORDER — ACETAMINOPHEN 650 MG RE SUPP: 325.0000 mg | RECTAL | Status: DC | PRN

## 2015-05-18 MED ORDER — PROPOFOL 10 MG/ML IV BOLUS
INTRAVENOUS | Status: DC | PRN
  Administered 2015-05-18: 150 mg via INTRAVENOUS

## 2015-05-18 MED ORDER — ACETAMINOPHEN 325 MG PO TABS
325.0000 mg | ORAL_TABLET | ORAL | Status: DC | PRN
  Administered 2015-05-19: 650 mg via ORAL
  Filled 2015-05-18: qty 2

## 2015-05-18 MED ORDER — MIDAZOLAM HCL 2 MG/2ML IJ SOLN
INTRAMUSCULAR | Status: AC
  Administered 2015-05-18: 1 mg
  Filled 2015-05-18: qty 2

## 2015-05-18 MED ORDER — ATORVASTATIN CALCIUM 20 MG PO TABS
40.0000 mg | ORAL_TABLET | Freq: Every day | ORAL | Status: DC
  Administered 2015-05-18: 40 mg via ORAL
  Filled 2015-05-18: qty 2

## 2015-05-18 MED ORDER — FENTANYL CITRATE (PF) 100 MCG/2ML IJ SOLN
INTRAMUSCULAR | Status: AC
  Administered 2015-05-18: 25 ug via INTRAVENOUS
  Filled 2015-05-18: qty 2

## 2015-05-18 MED ORDER — SODIUM CHLORIDE 0.9 % IV SOLN
INTRAVENOUS | Status: DC
Start: 2015-05-18 — End: 2015-05-19
  Administered 2015-05-18 – 2015-05-19 (×3): via INTRAVENOUS

## 2015-05-18 MED ORDER — HEPARIN SODIUM (PORCINE) 1000 UNIT/ML IJ SOLN
INTRAMUSCULAR | Status: DC | PRN
Start: 2015-05-18 — End: 2015-05-18
  Administered 2015-05-18: 5000 [IU] via INTRAVENOUS

## 2015-05-18 MED ORDER — EPHEDRINE SULFATE 50 MG/ML IJ SOLN
INTRAMUSCULAR | Status: DC | PRN
  Administered 2015-05-18: 5 mg via INTRAVENOUS

## 2015-05-18 MED ORDER — PHENOL 1.4 % MT LIQD: 1.0000 | OROMUCOSAL | Status: DC | PRN

## 2015-05-18 MED ORDER — MIDAZOLAM HCL 5 MG/ML IJ SOLN
1.0000 mg | Freq: Once | INTRAMUSCULAR | Status: AC
  Administered 2015-05-18: 1 mg via INTRAVENOUS

## 2015-05-18 MED ORDER — SODIUM CHLORIDE 0.9 % IV SOLN: 500.0000 mL | Freq: Once | INTRAVENOUS | Status: DC | PRN

## 2015-05-18 MED ORDER — ASPIRIN EC 81 MG PO TBEC
81.0000 mg | DELAYED_RELEASE_TABLET | Freq: Every day | ORAL | Status: DC
  Administered 2015-05-18 – 2015-05-19 (×2): 81 mg via ORAL
  Filled 2015-05-18 (×2): qty 1

## 2015-05-18 MED ORDER — LIDOCAINE HCL 1 % IJ SOLN
INTRAMUSCULAR | Status: DC | PRN
  Administered 2015-05-18: 17 mL

## 2015-05-18 MED ORDER — CEFAZOLIN SODIUM 1 G IJ SOLR
INTRAMUSCULAR | Status: AC
  Filled 2015-05-18: qty 10

## 2015-05-18 MED ORDER — CLONAZEPAM 1 MG PO TABS
1.0000 mg | ORAL_TABLET | Freq: Three times a day (TID) | ORAL | Status: DC
  Administered 2015-05-18 – 2015-05-19 (×3): 1 mg via ORAL
  Filled 2015-05-18 (×3): qty 1

## 2015-05-18 MED ORDER — SODIUM CHLORIDE 0.9 % IV SOLN
INTRAVENOUS | Status: DC | PRN
  Administered 2015-05-18: 30 mL via INTRAMUSCULAR

## 2015-05-18 MED ORDER — SODIUM CHLORIDE 0.9 % IV SOLN
INTRAVENOUS | Status: DC
  Administered 2015-05-18: 16:00:00 via INTRAVENOUS

## 2015-05-18 MED ORDER — METOPROLOL TARTRATE 1 MG/ML IV SOLN: 2.0000 mg | INTRAVENOUS | Status: DC | PRN

## 2015-05-18 MED ORDER — NITROGLYCERIN IN D5W 200-5 MCG/ML-% IV SOLN: 5.0000 ug/min | INTRAVENOUS | Status: DC

## 2015-05-18 MED ORDER — ACETAMINOPHEN 325 MG PO TABS: 650.0000 mg | ORAL_TABLET | Freq: Four times a day (QID) | ORAL | Status: DC | PRN

## 2015-05-18 MED ORDER — TRAZODONE HCL 50 MG PO TABS
100.0000 mg | ORAL_TABLET | Freq: Every day | ORAL | Status: DC
  Administered 2015-05-18: 100 mg via ORAL
  Filled 2015-05-18: qty 2

## 2015-05-18 MED ORDER — DULOXETINE HCL 60 MG PO CPEP
90.0000 mg | ORAL_CAPSULE | Freq: Every day | ORAL | Status: DC
  Administered 2015-05-18 – 2015-05-19 (×2): 90 mg via ORAL
  Filled 2015-05-18 (×2): qty 1

## 2015-05-18 MED ORDER — VANCOMYCIN HCL IN DEXTROSE 1-5 GM/200ML-% IV SOLN
1000.0000 mg | Freq: Two times a day (BID) | INTRAVENOUS | Status: AC
  Administered 2015-05-18: 1000 mg via INTRAVENOUS
  Filled 2015-05-18: qty 200

## 2015-05-18 MED ORDER — FENTANYL CITRATE (PF) 100 MCG/2ML IJ SOLN
INTRAMUSCULAR | Status: DC | PRN
  Administered 2015-05-18 (×2): 50 ug via INTRAVENOUS

## 2015-05-18 MED ORDER — BUDESONIDE-FORMOTEROL FUMARATE 80-4.5 MCG/ACT IN AERO
2.0000 | INHALATION_SPRAY | Freq: Two times a day (BID) | RESPIRATORY_TRACT | Status: DC
  Administered 2015-05-18 – 2015-05-19 (×2): 2 via RESPIRATORY_TRACT
  Filled 2015-05-18: qty 6.9

## 2015-05-18 MED ORDER — MIDAZOLAM HCL 5 MG/ML IJ SOLN: 1.0000 mg | Freq: Once | INTRAMUSCULAR | Status: AC

## 2015-05-18 MED ORDER — DOCUSATE SODIUM 100 MG PO CAPS
100.0000 mg | ORAL_CAPSULE | Freq: Two times a day (BID) | ORAL | Status: DC | PRN
  Filled 2015-05-18: qty 1

## 2015-05-18 MED ORDER — OXYCODONE-ACETAMINOPHEN 5-325 MG PO TABS
1.0000 | ORAL_TABLET | ORAL | Status: DC | PRN
  Administered 2015-05-18 (×2): 1 via ORAL
  Administered 2015-05-19 (×2): 2 via ORAL
  Filled 2015-05-18: qty 1
  Filled 2015-05-18 (×2): qty 2
  Filled 2015-05-18: qty 1

## 2015-05-18 MED ORDER — SODIUM CHLORIDE 0.9 % IV SOLN
INTRAVENOUS | Status: DC
  Administered 2015-05-18 (×2): via INTRAVENOUS

## 2015-05-18 MED ORDER — HEPARIN SODIUM (PORCINE) 1000 UNIT/ML IJ SOLN
INTRAMUSCULAR | Status: AC
  Filled 2015-05-18: qty 1

## 2015-05-18 MED ORDER — ESMOLOL HCL-SODIUM CHLORIDE 2000 MG/100ML IV SOLN
25.0000 ug/kg/min | INTRAVENOUS | Status: DC
  Administered 2015-05-18: 25 ug/kg/min via INTRAVENOUS
  Filled 2015-05-18: qty 100

## 2015-05-18 MED ORDER — DOPAMINE-DEXTROSE 3.2-5 MG/ML-% IV SOLN: 3.0000 ug/kg/min | INTRAVENOUS | Status: DC

## 2015-05-18 MED ORDER — LISINOPRIL 20 MG PO TABS
20.0000 mg | ORAL_TABLET | Freq: Every day | ORAL | Status: DC
  Administered 2015-05-18 – 2015-05-19 (×2): 20 mg via ORAL
  Filled 2015-05-18 (×2): qty 1

## 2015-05-18 MED ORDER — ONDANSETRON HCL 4 MG/2ML IJ SOLN
INTRAMUSCULAR | Status: DC | PRN
  Administered 2015-05-18: 4 mg via INTRAVENOUS

## 2015-05-18 MED ORDER — PANTOPRAZOLE SODIUM 40 MG PO TBEC
40.0000 mg | DELAYED_RELEASE_TABLET | Freq: Every day | ORAL | Status: DC
  Administered 2015-05-19: 40 mg via ORAL
  Filled 2015-05-18: qty 1

## 2015-05-18 MED ORDER — LIDOCAINE HCL (PF) 1 % IJ SOLN
INTRAMUSCULAR | Status: AC
  Filled 2015-05-18: qty 30

## 2015-05-18 MED ORDER — ALBUTEROL SULFATE (2.5 MG/3ML) 0.083% IN NEBU
2.5000 mg | INHALATION_SOLUTION | Freq: Four times a day (QID) | RESPIRATORY_TRACT | Status: DC | PRN
Start: 2015-05-18 — End: 2015-05-19

## 2015-05-18 MED ORDER — LIDOCAINE HCL (CARDIAC) 20 MG/ML IV SOLN
INTRAVENOUS | Status: DC | PRN
  Administered 2015-05-18: 30 mg via INTRAVENOUS

## 2015-05-18 MED ORDER — METFORMIN HCL 500 MG PO TABS: 500.0000 mg | ORAL_TABLET | Freq: Two times a day (BID) | ORAL | Status: DC

## 2015-05-18 MED ORDER — GABAPENTIN 300 MG PO CAPS
300.0000 mg | ORAL_CAPSULE | Freq: Four times a day (QID) | ORAL | Status: DC
  Administered 2015-05-18 – 2015-05-19 (×3): 300 mg via ORAL
  Filled 2015-05-18 (×3): qty 1

## 2015-05-18 MED ORDER — MIDAZOLAM HCL 2 MG/2ML IJ SOLN
INTRAMUSCULAR | Status: DC | PRN
  Administered 2015-05-18: 2 mg via INTRAVENOUS

## 2015-05-18 MED ORDER — HYDRALAZINE HCL 20 MG/ML IJ SOLN
5.0000 mg | INTRAMUSCULAR | Status: AC | PRN
  Administered 2015-05-19 (×2): 5 mg via INTRAVENOUS
  Filled 2015-05-18 (×2): qty 1

## 2015-05-18 MED ORDER — ONDANSETRON HCL 4 MG/2ML IJ SOLN
4.0000 mg | Freq: Four times a day (QID) | INTRAMUSCULAR | Status: DC | PRN
  Administered 2015-05-19: 4 mg via INTRAVENOUS
  Filled 2015-05-18: qty 2

## 2015-05-18 MED ORDER — NEOSTIGMINE METHYLSULFATE 10 MG/10ML IV SOLN
INTRAVENOUS | Status: DC | PRN
Start: 2015-05-18 — End: 2015-05-18
  Administered 2015-05-18: 3 mg via INTRAVENOUS

## 2015-05-18 MED ORDER — CLOPIDOGREL BISULFATE 75 MG PO TABS
75.0000 mg | ORAL_TABLET | Freq: Every day | ORAL | Status: DC
  Administered 2015-05-19: 75 mg via ORAL
  Filled 2015-05-18: qty 1

## 2015-05-18 MED ORDER — GLIPIZIDE ER 5 MG PO TB24
5.0000 mg | ORAL_TABLET | Freq: Every day | ORAL | Status: DC
  Administered 2015-05-18 – 2015-05-19 (×2): 5 mg via ORAL
  Filled 2015-05-18 (×2): qty 1

## 2015-05-18 MED ORDER — ROCURONIUM BROMIDE 100 MG/10ML IV SOLN
INTRAVENOUS | Status: DC | PRN
  Administered 2015-05-18: 30 mg via INTRAVENOUS

## 2015-05-18 MED ORDER — DOCUSATE SODIUM 100 MG PO CAPS
100.0000 mg | ORAL_CAPSULE | Freq: Every day | ORAL | Status: DC
  Administered 2015-05-19: 100 mg via ORAL

## 2015-05-18 MED ORDER — FENTANYL CITRATE (PF) 100 MCG/2ML IJ SOLN
25.0000 ug | INTRAMUSCULAR | Status: DC | PRN
  Administered 2015-05-18 (×4): 25 ug via INTRAVENOUS

## 2015-05-18 SURGICAL SUPPLY — 59 items
BAG DECANTER FOR FLEXI CONT (MISCELLANEOUS) ×2 IMPLANT
BLADE SURG 15 STRL LF DISP TIS (BLADE) ×1 IMPLANT
BLADE SURG 15 STRL SS (BLADE) ×1
BLADE SURG SZ11 CARB STEEL (BLADE) ×2 IMPLANT
BOOT SUTURE AID YELLOW STND (SUTURE) ×2 IMPLANT
BRUSH SCRUB 4% CHG (MISCELLANEOUS) ×2 IMPLANT
CANISTER SUCT 1200ML W/VALVE (MISCELLANEOUS) ×2 IMPLANT
CATH FOLEY SIL 2WAY 12FR5CC (CATHETERS) ×2 IMPLANT
CATH TRAY 16F METER LATEX (MISCELLANEOUS) ×4 IMPLANT
DRAPE INCISE IOBAN 66X45 STRL (DRAPES) ×2 IMPLANT
DRAPE PED LAPAROTOMY (DRAPES) ×2 IMPLANT
DRAPE SHEET LG 3/4 BI-LAMINATE (DRAPES) ×2 IMPLANT
DRSG TEGADERM 4X4.75 (GAUZE/BANDAGES/DRESSINGS) IMPLANT
DRSG TELFA 3X8 NADH (GAUZE/BANDAGES/DRESSINGS) IMPLANT
DURAPREP 26ML APPLICATOR (WOUND CARE) ×2 IMPLANT
ELECT CAUTERY BLADE 6.4 (BLADE) ×2 IMPLANT
EVICEL 2ML SEALANT HUMAN (Miscellaneous) ×2 IMPLANT
GLOVE BIO SURGEON STRL SZ7 (GLOVE) ×16 IMPLANT
GOWN STRL REUS W/ TWL LRG LVL3 (GOWN DISPOSABLE) ×3 IMPLANT
GOWN STRL REUS W/ TWL XL LVL3 (GOWN DISPOSABLE) ×1 IMPLANT
GOWN STRL REUS W/TWL LRG LVL3 (GOWN DISPOSABLE) ×3
GOWN STRL REUS W/TWL XL LVL3 (GOWN DISPOSABLE) ×1
HEMOSTAT SURGICEL 2X3 (HEMOSTASIS) ×2 IMPLANT
IV NS 250ML (IV SOLUTION) ×1
IV NS 250ML BAXH (IV SOLUTION) ×1 IMPLANT
KIT RM TURNOVER STRD PROC AR (KITS) ×2 IMPLANT
LABEL OR SOLS (LABEL) ×2 IMPLANT
LIQUID BAND (GAUZE/BANDAGES/DRESSINGS) ×2 IMPLANT
LOOP RED MAXI  1X406MM (MISCELLANEOUS) ×2
LOOP VESSEL MAXI 1X406 RED (MISCELLANEOUS) ×2 IMPLANT
LOOP VESSEL MINI 0.8X406 BLUE (MISCELLANEOUS) ×1 IMPLANT
LOOPS BLUE MINI 0.8X406MM (MISCELLANEOUS) ×1
NEEDLE FILTER BLUNT 18X 1/2SAF (NEEDLE) ×1
NEEDLE FILTER BLUNT 18X1 1/2 (NEEDLE) ×1 IMPLANT
NEEDLE HYPO 25X1 1.5 SAFETY (NEEDLE) ×2 IMPLANT
NS IRRIG 1000ML POUR BTL (IV SOLUTION) ×2 IMPLANT
PACK BASIN MAJOR ARMC (MISCELLANEOUS) ×2 IMPLANT
PAD GROUND ADULT SPLIT (MISCELLANEOUS) ×2 IMPLANT
PATCH CAROTID ECM VASC 1X10 (Prosthesis & Implant Heart) ×2 IMPLANT
PENCIL ELECTRO HAND CTR (MISCELLANEOUS) IMPLANT
SHUNT CAROTID PRUITT F3 T3103A (SHUNT) IMPLANT
SHUNT CAROTID VASCU II 9FR 30 (SHUNT) ×2 IMPLANT
SUT MNCRL 4-0 (SUTURE) ×1
SUT MNCRL 4-0 27XMFL (SUTURE) ×1
SUT PROLENE 6 0 BV (SUTURE) ×8 IMPLANT
SUT PROLENE 7 0 BV 1 (SUTURE) ×4 IMPLANT
SUT SILK 2 0 (SUTURE) ×1
SUT SILK 2-0 18XBRD TIE 12 (SUTURE) ×1 IMPLANT
SUT SILK 3 0 (SUTURE) ×1
SUT SILK 3-0 18XBRD TIE 12 (SUTURE) ×1 IMPLANT
SUT SILK 4 0 (SUTURE) ×1
SUT SILK 4-0 18XBRD TIE 12 (SUTURE) ×1 IMPLANT
SUT VIC AB 3-0 SH 27 (SUTURE) ×2
SUT VIC AB 3-0 SH 27X BRD (SUTURE) ×2 IMPLANT
SUTURE MNCRL 4-0 27XMF (SUTURE) ×1 IMPLANT
SYR 20CC LL (SYRINGE) ×2 IMPLANT
SYRINGE 10CC LL (SYRINGE) ×4 IMPLANT
TOWEL OR 17X26 4PK STRL BLUE (TOWEL DISPOSABLE) IMPLANT
TUBING CONNECTING 10 (TUBING) IMPLANT

## 2015-05-18 NOTE — Op Note (Signed)
[name]   OPERATIVE NOTE  PROCEDURE:   1.  Left carotid endarterectomy with CorMatrix arterial patch reconstruction  PRE-OPERATIVE DIAGNOSIS: 1.  High grade left carotid stenosis 2.  Previous TIA  POST-OPERATIVE DIAGNOSIS: same as above   SURGEON: Leotis Pain, MD  ASSISTANT(S): none  ANESTHESIA: general  ESTIMATED BLOOD LOSS: 75 cc  FINDING(S): 1.  left carotid plaque.  SPECIMEN(S):  Carotid plaque (sent to Pathology)  INDICATIONS:   Tammy Armstrong is a 64 y.o. female who presents with left hemispheric TIAs and left carotid stenosis of 80-90%.  I discussed with the patient the risks, benefits, and alternatives to carotid endarterectomy.  I discussed the differences between carotid stenting and carotid endarterectomy. I discussed the procedural details of carotid endarterectomy with the patient.  The patient is aware that the risks of carotid endarterectomy include but are not limited to: bleeding, infection, stroke, myocardial infarction, death, cranial nerve injuries both temporary and permanent, neck hematoma, possible airway compromise, labile blood pressure post-operatively, cerebral hyperperfusion syndrome, and possible need for additional interventions in the future. The patient is aware of the risks and agrees to proceed forward with the procedure.  DESCRIPTION: After full informed written consent was obtained from the patient, the patient was brought back to the operating room and placed supine upon the operating table.  Prior to induction, the patient received IV antibiotics.  After obtaining adequate anesthesia, the patient was placed into a modified beach chair position with a shoulder roll in place and the patient's neck slightly hyperextended and rotated away from the surgical site.  The patient was prepped in the standard fashion for a carotid endarterectomy.  I made an incision anterior to the sternocleidomastoid muscle and dissected down through  the subcutaneous tissue.  The platysmas was opened with electrocautery.  Then I dissected down to the internal jugular vein and facial vein.  The facial vein is ligated and divided between 2-0 silk ties.  This was dissected posteriorly until I obtained visualization of the common carotid artery.  This was dissected out and then a vessel loop was placed around the common carotid artery.  I then dissected in a periadventitial fashion along the common carotid artery up to the bifurcation.  I then identified the external carotid artery and the superior thyroid artery.  I placed a vessel loop around the superior thyroid artery, and I also dissected out the external carotid artery and placed a vessel loop around it. In the process of this dissection, the hypoglossal nerve was identified and protected from harm.  I then dissected out the internal carotid artery until I identified an area in the internal carotid artery clearly above the stenosis.  I dissected slightly distal to this area, and placed a vessel loop around the artery.  At this point, we gave the patient 5000 units of intravenous heparin.  After this was allowed to circulate for several minutes, I pulled up control on the vessel loops to clamp the internal carotid artery, external carotid artery, superior thyroid artery, and then the common carotid artery.  I then made an arteriotomy in the common carotid artery with a 11 blade, and extended the arteriotomy with a Potts scissor down into the common carotid artery, then I carried the arteriotomy through the bifurcation into the internal carotid artery until I reached an area that was not diseased.  At this point, I took the Pru[place] shunt that previously been prepared and I inserted it into the internal carotid artery  first, and then into the common carotid artery taking care to flush and de-air prior to release of control. At this point, I started the endarterectomy in the common carotid artery with a  Penfield elevator and carried this dissection down into the common carotid artery circumferentially.  Then I transected the plaque at a segment where it was adherent and transected the plaque with Potts scissors.  I then carried this dissection up into the external carotid artery.  The plaque was extracted by unclamping the external carotid artery and performing an eversion endarterectomy.  The dissection was then carried into the internal carotid artery where a nice feathered end point was created with gentle traction.  I passed the plaque off the field as a specimen. At this point I removed all loose flecks and remaining disease possible.  At this point, I was satisfied that the minimal remaining disease was densely adherent to the wall and wall integrity was intact. The distal endpoint was tacked down with two 7-0 Prolene sutures.  I then fashioned a CorMatrix arterial patch for the artery and sewed it in place with two running stitch of 6-0 Prolene.  I started at the distal endpoint and ran one half the length of the arteriotomy.  I then cut and beveled the patch to an appropriate length to match the arteriotomy.  I started the second 6-0 Prolene at the proximal end point.  The medial suture line was completed and the lateral suture line was run approximately one quarter the length of the arteriotomy.  Prior to completing this patch angioplasty, I removed the shunt first from the internal carotid artery, from which there was excellent backbleeding, and clamped it.  Then I removed the shunt from the common carotid artery, from which there was excellent antegrade bleeding, and then clamped it.  At this point, I allowed the external carotid artery to backbleed, which was excellent.  Then I instilled heparinized saline in this patched artery and then completed the patch angioplasty in the usual fashion.  First, I released the clamp on the external carotid artery, then I released it on the common carotid artery.   After waiting a few seconds, I then released it on the internal carotid artery. Several minutes of pressure were held.  At this point, I placed Surgicel and Evicel topical hemostatic agents.  There was no more active bleeding in the surgical site.  The sternocleidomastoid space was closed with three interrupted 3-0 Vicryl sutures. I then reapproximated the platysma muscle with a running stitch of 3-0 Vicryl.  The skin was then closed with a running subcuticular 4-0 Monocryl.  The skin was then cleaned, dried and Dermabond was used to reinforce the skin closure.  The patient awakened and was taken to the recovery room in stable condition, following commands and moving all four extremities without any apparent deficits.    COMPLICATIONS: none  CONDITION: stable  DEW,JASON  05/18/2015, 1:22 PM

## 2015-05-18 NOTE — Anesthesia Procedure Notes (Signed)
Procedure Name: Intubation Date/Time: 05/18/2015 10:49 AM Performed by: Courtney Paris Pre-anesthesia Checklist: Patient identified, Emergency Drugs available, Suction available and Patient being monitored Patient Re-evaluated:Patient Re-evaluated prior to inductionOxygen Delivery Method: Circle system utilized Preoxygenation: Pre-oxygenation with 100% oxygen Intubation Type: IV induction Ventilation: Mask ventilation without difficulty Laryngoscope Size: Miller and 3 Grade View: Grade II Tube type: Oral Tube size: 7.0 mm Number of attempts: 1 Airway Equipment and Method: Stylet Placement Confirmation: ETT inserted through vocal cords under direct vision,  positive ETCO2,  CO2 detector and breath sounds checked- equal and bilateral Secured at: 20 cm Tube secured with: Tape Dental Injury: Teeth and Oropharynx as per pre-operative assessment

## 2015-05-18 NOTE — Progress Notes (Signed)
Patient resting comfortably after surgery today Neuro status intact Neck with mild swelling BP around 140/50.  HR around 60. Overall doing quite well. Check labs in am

## 2015-05-18 NOTE — Anesthesia Preprocedure Evaluation (Signed)
Anesthesia Evaluation  Patient identified by MRN, date of birth, ID band Patient awake    Reviewed: Allergy & Precautions, NPO status , Patient's Chart, lab work & pertinent test results  Airway Mallampati: II  TM Distance: >3 FB Neck ROM: Limited    Dental  (+) Edentulous Upper, Edentulous Lower   Pulmonary shortness of breath and with exertion, COPD,  COPD inhaler, Current Smoker,    Pulmonary exam normal breath sounds clear to auscultation       Cardiovascular Exercise Tolerance: Poor hypertension, Pt. on medications and Pt. 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    Peds  Hematology   Anesthesia Other Findings   Reproductive/Obstetrics                             Anesthesia Physical Anesthesia Plan  ASA: IV  Anesthesia Plan: General   Post-op Pain Management:    Induction: Intravenous  Airway Management Planned: Oral ETT  Additional Equipment: Arterial line  Intra-op Plan:   Post-operative Plan: Extubation in OR  Informed Consent: I have reviewed the patients History and Physical, chart, labs and discussed the procedure including the risks, benefits and alternatives for the proposed anesthesia with the patient or authorized representative who has indicated his/her understanding and acceptance.     Plan Discussed with: CRNA  Anesthesia Plan Comments:         Anesthesia Quick Evaluation

## 2015-05-18 NOTE — Clinical Social Work Note (Signed)
Clinical Social Work Assessment  Patient Details  Name: Tammy Armstrong MRN: [id number] Date of Birth: [birth year]  Date of referral:  05/18/15               Reason for consult:  Facility Placement                Permission sought to share information with:    Permission granted to share information::     Name::        Agency::     Relationship::     Contact Information:     Housing/Transportation Living arrangements for the past 2 months:  [place] of Information:  Facility Patient Interpreter Needed:  None Criminal Activity/Legal Involvement Pertinent to Current Situation/Hospitalization:  No - Comment as needed Significant Relationships:  None Lives with:  Facility Resident Do you feel safe going back to the place where you live?    Need for family participation in patient care:  No (Coment)  Care giving concerns:  None, patient resides at [facility].   Social Worker assessment / plan:  Patient just transferred this afternoon from surgery to ICU. The owner of the [facility]: Shirl Harris: [phone] was at the hospital and informed CSW that she would take patient back at discharge and had no concerns and that she would provide transportation when time for discharge. FL2 completed and placed on chart.  Employment status:  Disabled (Comment on whether or not currently receiving Disability) Insurance information:  Medicare PT Recommendations:  Not assessed at this time Information / Referral to community resources:     Patient/Family's Response to care:  Patient recovering from surgery today in ICU  Patient/Family's Understanding of and Emotional Response to Diagnosis, Current Treatment, and Prognosis:  Unable to assess  Emotional Assessment Appearance:  Appears stated age Attitude/Demeanor/Rapport:  Unable to Assess (back from  post op) Affect (typically observed):  Unable to Assess Orientation:    Alcohol / Substance use:  Not  Applicable Psych involvement (Current and /or in the community):  No (Comment)  Discharge Needs  Concerns to be addressed:  Care Coordination Readmission within the last 30 days:  No Current discharge risk:  None Barriers to Discharge:  No Barriers Identified   Shela Leff, LCSW 05/18/2015, 4:36 PM

## 2015-05-18 NOTE — Anesthesia Postprocedure Evaluation (Signed)
  Anesthesia Post-op Note  Patient: Tammy Armstrong  Procedure(s) Performed: Procedure(s): ENDARTERECTOMY CAROTID (Left)  Anesthesia type:General  Patient location: PACU  Post pain: Pain level controlled  Post assessment: Post-op Vital signs reviewed, Patient's Cardiovascular Status Stable, Respiratory Function Stable, Patent Airway and No signs of Nausea or vomiting  Post vital signs: Reviewed and stable  Last Vitals:  Filed Vitals:   05/18/15 1321  BP: 160/62  Pulse: 69  Temp: 37.6 C  Resp: 15    Level of consciousness: awake, alert  and patient cooperative  Complications: No apparent anesthesia complications

## 2015-05-18 NOTE — H&P (Signed)
  [facility] History & Physical Update  The patient was interviewed and re-examined.  The patient's previous History and Physical has been reviewed and is unchanged.  There is no change in the plan of care. We plan to proceed with the scheduled procedure.  Janera Peugh, MD  05/18/2015, 10:28 AM

## 2015-05-18 NOTE — Transfer of Care (Signed)
Immediate Anesthesia Transfer of Care Note  Patient: Tammy Armstrong  Procedure(s) Performed: Procedure(s): ENDARTERECTOMY CAROTID (Left)  Patient Location: PACU  Anesthesia Type:General  Level of Consciousness: awake, oriented and patient cooperative  Airway & Oxygen Therapy: Patient Spontanous Breathing and Patient connected to face mask oxygen  Post-op Assessment: Report given to RN and Post -op Vital signs reviewed and stable  Post vital signs: Reviewed and stable  Last Vitals:  Filed Vitals:   05/18/15 0841  BP: 192/70  Pulse: 59  Temp: 37 C  Resp: 18    Complications: No apparent anesthesia complications

## 2015-05-19 LAB — BASIC METABOLIC PANEL
ANION GAP: 4 — AB (ref 5–15)
BUN: 13 mg/dL (ref 6–20)
CHLORIDE: 111 mmol/L (ref 101–111)
CO2: 27 mmol/L (ref 22–32)
Calcium: 8.3 mg/dL — ABNORMAL LOW (ref 8.9–10.3)
Creatinine, Ser: 0.71 mg/dL (ref 0.44–1.00)
GFR calc Af Amer: >60 mL/min (ref >60)
GFR calc non Af Amer: >60 mL/min (ref >60)
GLUCOSE: 107 mg/dL — AB (ref 65–99)
POTASSIUM: 4 mmol/L (ref 3.5–5.1)
Sodium: 142 mmol/L (ref 135–145)

## 2015-05-19 LAB — CBC
HCT: 32.2 % — ABNORMAL LOW (ref 35.0–47.0)
HEMOGLOBIN: 10.8 g/dL — AB (ref 12.0–16.0)
MCH: 31.7 pg (ref 26.0–34.0)
MCHC: 33.6 g/dL (ref 32.0–36.0)
MCV: 94.3 fL (ref 80.0–100.0)
PLATELETS: 178 10*3/uL (ref 150–440)
RBC: 3.42 MIL/uL — AB (ref 3.80–5.20)
RDW: 13.1 % (ref 11.5–14.5)
WBC: 7.7 10*3/uL (ref 3.6–11.0)

## 2015-05-19 LAB — SURGICAL PATHOLOGY

## 2015-05-19 MED ORDER — CLOPIDOGREL BISULFATE 75 MG PO TABS: 75.0000 mg | ORAL_TABLET | Freq: Every day | ORAL | Status: DC

## 2015-05-19 MED ORDER — OXYCODONE-ACETAMINOPHEN 5-325 MG PO TABS: 1.0000 | ORAL_TABLET | ORAL | Status: DC | PRN

## 2015-05-19 MED ORDER — ASPIRIN 81 MG PO TBEC: 81.0000 mg | DELAYED_RELEASE_TABLET | Freq: Every day | ORAL | Status: DC

## 2015-05-19 NOTE — Progress Notes (Signed)
Pt to be discharged home per Dr Bunnie Domino verbal orders; pts blood pressure elevated during shift administered prn hydralazine twice for systolic greater than 076'K Dr Lucky Cowboy aware systolic blood pressure ranging in the 130's; other vss; pt tolerated ambulation and sitting in chair; pt tolerated advancement of diet; administered pain medication per md orders pt received relief; left neck incision well approximated clean dry and intact no hematoma small amount of ecchymosis Dr Lucky Cowboy assessed site he educated pt about restrictions once she is discharged home pt stated she understood the instructions given to her by Dr Lucky Cowboy; pt voided post foley removal; will continue to monitor and assess pt

## 2015-05-19 NOTE — Progress Notes (Signed)
Patient is medically stable for D/C back to [place]. Per [name] group home administrator she will pick patient up from the hospital around 2 pm. Clinical Social Worker (CSW) prepared D/C packet and faxed D/C Summary, FL2 and prescriptions to [facility]. Patient is aware of above. RN is aware of above. Please reconsult if future social work needs arise. CSW signing off.   Blima Rich, [place] [phone]

## 2015-05-19 NOTE — Progress Notes (Signed)
Per Dr. Lucky Cowboy treat and monitor pts blood pressure based on the blood pressure cuff reading not the A line reading

## 2015-05-19 NOTE — Progress Notes (Signed)
Tammy Armstrong packet given to [place] from [place] informed her per Dr Lucky Cowboy pt may resume he Metformin on Oct. 28, 2016.  Also informed [name] pt received Percocet at 13:28 with relief of pain

## 2015-05-19 NOTE — Discharge Summary (Signed)
[facility]    Discharge Summary    Patient ID:  Tammy Armstrong MRN: [id number] DOB/AGE: 1951/06/15 64 y.o.  Admit date: 05/18/2015 Discharge date: 05/19/2015 Date of Surgery: 05/18/2015 Surgeon: Surgeon(s): Algernon Huxley, MD  Admission Diagnosis: carotid artery stenosis  Discharge Diagnoses:  carotid artery stenosis  Secondary Diagnoses: Past Medical History  Diagnosis Date  . Migraines   . Hypertension   . Hepatitis C   . Anxiety   . Stroke ([place])   . Seizure ([place]) 07/02/2011  . 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 Respiratory failure ([place]) 07/04/2011  . COPD (chronic obstructive pulmonary disease) ([place]) 07/04/2011  . Hypercholesterolemia   . Diabetes mellitus   . Diabetes mellitus 07/04/2011  . Depression   . Carotid artery occlusion   . Shortness of breath dyspnea   . Cigarette smoker one half pack a day or less   . GERD (gastroesophageal reflux disease)   . Arthritis   . Pancreatitis     Procedure(s): ENDARTERECTOMY CAROTID  Discharged Condition: good  HPI:  Patient with previous TIA symptoms and high grade left carotid stenosis.  Brought in for left CEA  Hospital Course:  Tammy Armstrong is a 64 y.o. female is S/P left Procedure(s): ENDARTERECTOMY CAROTID Extubated: immediately after surgery Physical exam: neuro exam intact, AF/VSS Post-op wounds minimal neck swelling and bruising, no hematoma Pt. Ambulating, voiding and taking PO diet without difficulty. Pt pain controlled with PO pain meds. Labs as below Complications: none  Consults:     Significant Diagnostic Studies: CBC Lab Results  Component Value Date   WBC 7.7 05/19/2015   HGB 10.8* 05/19/2015   HCT 32.2* 05/19/2015   MCV 94.3 05/19/2015   PLT 178 05/19/2015    BMET    Component Value Date/Time   NA 142 05/19/2015 0526   NA 140 07/11/2014 0546   K 4.0 05/19/2015 0526   K 3.5 07/11/2014 0546   CL 111 05/19/2015 0526   CL 104 07/11/2014 0546   CO2 27 05/19/2015 0526   CO2 29 07/11/2014 0546   GLUCOSE 107* 05/19/2015 0526   GLUCOSE 81 07/11/2014 0546   BUN 13 05/19/2015 0526   BUN 17 07/11/2014 0546   CREATININE 0.71 05/19/2015 0526   CREATININE 0.88 07/11/2014 0546   CALCIUM 8.3* 05/19/2015 0526   CALCIUM 8.5 07/11/2014 0546   GFRNONAA >60 05/19/2015 0526   GFRNONAA >60 07/11/2014 0546   GFRNONAA >60 02/12/2012 1601   GFRAA >60 05/19/2015 0526   GFRAA >60 07/11/2014 0546   GFRAA >60 02/12/2012 1601   COAG Lab Results  Component Value Date   INR 1.03 05/13/2015   INR 1.02 04/06/2015   INR 1.0 07/11/2014     Disposition:  Discharge to  home    Medication List    STOP taking these medications        aspirin 325 MG tablet  Replaced by:  aspirin 81 MG EC tablet      TAKE these medications        acetaminophen 325 MG tablet  Commonly known as:  TYLENOL  Take 650 mg by mouth every 6 (six) hours as needed for mild pain or headache.     albuterol 108 (90 BASE) MCG/ACT inhaler  Commonly known as:  PROVENTIL HFA;VENTOLIN HFA  Inhale 2 puffs into the lungs every 6 (six) hours as needed for wheezing or shortness of breath.     aspirin 81 MG EC tablet  Take 1 tablet (  81 mg total) by mouth daily.     atorvastatin 40 MG tablet  Commonly known as:  LIPITOR  Take 1 tablet (40 mg total) by mouth daily at 6 PM.     budesonide-formoterol 80-4.5 MCG/ACT inhaler  Commonly known as:  SYMBICORT  Inhale 2 puffs into the lungs 2 (two) times daily.     clonazePAM 1 MG tablet  Commonly known as:  KLONOPIN  Take 1 mg by mouth 3 (three) times daily.     clopidogrel 75 MG tablet  Commonly known as:  PLAVIX  Take 1 tablet (75 mg total) by mouth daily with breakfast.     docusate sodium 100 MG capsule  Commonly known as:  COLACE  Take 100 mg by mouth 2 (two) times daily as needed for mild constipation.     DULoxetine 30 MG capsule  Commonly known as:  CYMBALTA  Take 90 mg by mouth daily.     gabapentin 300 MG  capsule  Commonly known as:  NEURONTIN  Take 300 mg by mouth 4 (four) times daily.     glipiZIDE 5 MG 24 hr tablet  Commonly known as:  GLUCOTROL XL  Take 5 mg by mouth daily.     lisinopril 20 MG tablet  Commonly known as:  PRINIVIL,ZESTRIL  Take 20 mg by mouth daily.     metFORMIN 500 MG tablet  Commonly known as:  GLUCOPHAGE  Take 500 mg by mouth 2 (two) times daily with a meal.     metoprolol succinate 50 MG 24 hr tablet  Commonly known as:  TOPROL-XL  Take 50 mg by mouth daily.     omeprazole 20 MG capsule  Commonly known as:  PRILOSEC  Take 20 mg by mouth 2 (two) times daily.     oxyCODONE-acetaminophen 5-325 MG tablet  Commonly known as:  PERCOCET/ROXICET  Take 1-2 tablets by mouth every 4 (four) hours as needed for moderate pain.     traZODone 100 MG tablet  Commonly known as:  DESYREL  Take 100 mg by mouth at bedtime.       Verbal and written Discharge instructions given to the patient. Wound care per Discharge AVS     Follow-up Information    Follow up with [facility] STEGMAYER, PA-C In 4 weeks.   Specialty:  Physician Assistant   Why:  with carotid duplex   Contact information:   [place] [address] [phone]       Signed: Leotis Pain, MD  05/19/2015, 12:23 PM

## 2015-05-20 ENCOUNTER — Emergency Department
Admission: EM | Admit: 2015-05-20 | Discharge: 2015-05-20 | Disposition: A | Discharge status: Home / Self Care | Payer: Medicare Other | Attending: Emergency Medicine | Admitting: Emergency Medicine

## 2015-05-20 DIAGNOSIS — Z7951 Long term (current) use of inhaled steroids: Secondary | ICD-10-CM | POA: Diagnosis not present

## 2015-05-20 DIAGNOSIS — Z720 Tobacco use: Secondary | ICD-10-CM | POA: Insufficient documentation

## 2015-05-20 DIAGNOSIS — W19XXXA Unspecified fall, initial encounter: Secondary | ICD-10-CM

## 2015-05-20 DIAGNOSIS — Z7902 Long term (current) use of antithrombotics/antiplatelets: Secondary | ICD-10-CM | POA: Diagnosis not present

## 2015-05-20 DIAGNOSIS — Y9389 Activity, other specified: Secondary | ICD-10-CM | POA: Diagnosis not present

## 2015-05-20 DIAGNOSIS — R42 Dizziness and giddiness: Secondary | ICD-10-CM | POA: Diagnosis present

## 2015-05-20 DIAGNOSIS — W1839XA Other fall on same level, initial encounter: Secondary | ICD-10-CM | POA: Insufficient documentation

## 2015-05-20 DIAGNOSIS — E119 Type 2 diabetes mellitus without complications: Secondary | ICD-10-CM | POA: Insufficient documentation

## 2015-05-20 DIAGNOSIS — Y9289 Other specified places as the place of occurrence of the external cause: Secondary | ICD-10-CM | POA: Insufficient documentation

## 2015-05-20 DIAGNOSIS — Y998 Other external cause status: Secondary | ICD-10-CM | POA: Insufficient documentation

## 2015-05-20 DIAGNOSIS — Z043 Encounter for examination and observation following other accident: Secondary | ICD-10-CM | POA: Insufficient documentation

## 2015-05-20 DIAGNOSIS — Z79899 Other long term (current) drug therapy: Secondary | ICD-10-CM | POA: Insufficient documentation

## 2015-05-20 DIAGNOSIS — Z7982 Long term (current) use of aspirin: Secondary | ICD-10-CM | POA: Insufficient documentation

## 2015-05-20 DIAGNOSIS — Z880 Allergy status to penicillin: Secondary | ICD-10-CM | POA: Insufficient documentation

## 2015-05-20 DIAGNOSIS — I10 Essential (primary) hypertension: Secondary | ICD-10-CM | POA: Diagnosis not present

## 2015-05-20 NOTE — ED Notes (Signed)
BEHAVIORAL HEALTH ROUNDING Patient sleeping: No. Patient alert and oriented: yes Behavior appropriate: Yes.  ; If no, describe:  Nutrition and fluids offered: yes Toileting and hygiene offered: Yes  Sitter present: q15 minute observations and security camera monitoring Law enforcement present: Yes  ODS  

## 2015-05-20 NOTE — Discharge Instructions (Signed)
Please seek medical attention for any high fevers, chest pain, shortness of breath, change in behavior, persistent vomiting, bloody stool or any other new or concerning symptoms. ° °Fall Prevention in the Home  °Falls can cause injuries and can affect people from all age groups. There are many simple things that you can do to make your home safe and to help prevent falls. °WHAT CAN I DO ON THE OUTSIDE OF MY HOME? °· Regularly repair the edges of walkways and driveways and fix any cracks. °· Remove high doorway thresholds. °· Trim any shrubbery on the main path into your home. °· Use bright outdoor lighting. °· Clear walkways of debris and clutter, including tools and rocks. °· Regularly check that handrails are securely fastened and in good repair. Both sides of any steps should have handrails. °· Install guardrails along the edges of any raised decks or porches. °· Have leaves, snow, and ice cleared regularly. °· Use sand or salt on walkways during winter months. °· In the garage, clean up any spills right away, including grease or oil spills. °WHAT CAN I DO IN THE BATHROOM? °· Use night lights. °· Install grab bars by the toilet and in the tub and shower. Do not use towel bars as grab bars. °· Use non-skid mats or decals on the floor of the tub or shower. °· If you need to sit down while you are in the shower, use a plastic, non-slip stool.. °· Keep the floor dry. Immediately clean up any water that spills on the floor. °· Remove soap buildup in the tub or shower on a regular basis. °· Attach bath mats securely with double-sided non-slip rug tape. °· Remove throw rugs and other tripping hazards from the floor. °WHAT CAN I DO IN THE BEDROOM? °· Use night lights. °· Make sure that a bedside light is easy to reach. °· Do not use oversized bedding that drapes onto the floor. °· Have a firm chair that has side arms to use for getting dressed. °· Remove throw rugs and other tripping hazards from the floor. °WHAT CAN I  DO IN THE KITCHEN?  °· Clean up any spills right away. °· Avoid walking on wet floors. °· Place frequently used items in easy-to-reach places. °· If you need to reach for something above you, use a sturdy step stool that has a grab bar. °· Keep electrical cables out of the way. °· Do not use floor polish or wax that makes floors slippery. If you have to use wax, make sure that it is non-skid floor wax. °· Remove throw rugs and other tripping hazards from the floor. °WHAT CAN I DO IN THE STAIRWAYS? °· Do not leave any items on the stairs. °· Make sure that there are handrails on both sides of the stairs. Fix handrails that are broken or loose. Make sure that handrails are as long as the stairways. °· Check any carpeting to make sure that it is firmly attached to the stairs. Fix any carpet that is loose or worn. °· Avoid having throw rugs at the top or bottom of stairways, or secure the rugs with carpet tape to prevent them from moving. °· Make sure that you have a light switch at the top of the stairs and the bottom of the stairs. If you do not have them, have them installed. °WHAT ARE SOME OTHER FALL PREVENTION TIPS? °· Wear closed-toe shoes that fit well and support your feet. Wear shoes that have rubber soles or low   heels. °· When you use a stepladder, make sure that it is completely opened and that the sides are firmly locked. Have someone hold the ladder while you are using it. Do not climb a closed stepladder. °· Add color or contrast paint or tape to grab bars and handrails in your home. Place contrasting color strips on the first and last steps. °· Use mobility aids as needed, such as canes, walkers, scooters, and crutches. °· Turn on lights if it is dark. Replace any light bulbs that burn out. °· Set up furniture so that there are clear paths. Keep the furniture in the same spot. °· Fix any uneven floor surfaces. °· Choose a carpet design that does not hide the edge of steps of a stairway. °· Be aware of any  and all pets. °· Review your medicines with your healthcare provider. Some medicines can cause dizziness or changes in blood pressure, which increase your risk of falling. °Talk with your health care provider about other ways that you can decrease your risk of falls. This may include working with a physical therapist or trainer to improve your strength, balance, and endurance. °  °This information is not intended to replace advice given to you by your health care provider. Make sure you discuss any questions you have with your health care provider. °  °Document Released: 06/29/2002 Document Revised: 11/23/2014 Document Reviewed: 08/13/2014 °Elsevier Interactive Patient Education ©2016 [employer] ° °

## 2015-05-20 NOTE — ED Notes (Signed)
Lunch provided along with an extra drink  [place] Patient sleeping: No. Patient alert and oriented: yes Behavior appropriate: Yes.  ; If no, describe:  Nutrition and fluids offered: yes Toileting and hygiene offered: Yes  Sitter present: q15 minute observations and security camera monitoring Law enforcement present: Yes  ODS   ENVIRONMENTAL ASSESSMENT Potentially harmful objects out of patient reach: Yes.   Personal belongings secured: Yes.   Patient dressed in hospital provided attire only: Yes.   Plastic bags out of patient reach: Yes.   Patient care equipment (cords, cables, call bells, lines, and drains) shortened, removed, or accounted for: Yes.   Equipment and supplies removed from bottom of stretcher: Yes.   Potentially toxic materials out of patient reach: Yes.   Sharps container removed or out of patient reach: Yes.

## 2015-05-20 NOTE — ED Provider Notes (Signed)
[facility] Emergency Department Provider Note    ____________________________________________  Time seen: 1410  I have reviewed the triage vital signs and the nursing notes.   HISTORY  Chief Complaint Dizziness   History limited by: Not Limited   HPI Tammy Armstrong is a 63 y.o. female who presents to the emergency department today after a fall. The patient states that when she got up she was very tired. She states that she took a couple of Percocets and then she became a little weak and off balance and fell. She denies any traumatic injuries. She states that her roommates overreacted and having her come to the emergency department. She denied any concurrent chest pain, shortness breath or palpitations. She currently states she feels much better. She has no current complaints. She did clarify that she was going to the bathroom when she fell and did not make it in time, thus urinated on herself, rather than true incontinence. Of note the patient did undergo a left endarterectomy 2 days ago.    Past Medical History  Diagnosis Date  . Migraines   . Hypertension   . Hepatitis C   . Anxiety   . Stroke ([place])   . Seizure ([place]) 07/02/2011  . 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 Respiratory failure ([place]) 07/04/2011  . COPD (chronic obstructive pulmonary disease) ([place]) 07/04/2011  . Hypercholesterolemia   . Diabetes mellitus   . Diabetes mellitus 07/04/2011  . Depression   . Carotid artery occlusion   . Shortness of breath dyspnea   . Cigarette smoker one half pack a day or less   . GERD (gastroesophageal reflux disease)   . Arthritis   . Pancreatitis     Patient Active Problem List   Diagnosis Date Noted  . Carotid stenosis 04/08/2015  . Migraine 04/06/2015  . Dementia with behavioral disturbance 01/19/2015  . Diabetes mellitus 07/04/2011  . Anxiety 07/04/2011  . COPD (chronic obstructive pulmonary disease) ([place]) 07/04/2011  . Purulent bronchitis  ([place]) 07/04/2011  . 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 Abdominal hysterectomy    . Cholecystectomy    . Back surgery  2012    cyst removed from spine , [place]  . Appendectomy    . Endarterectomy Left 05/18/2015    Procedure: ENDARTERECTOMY CAROTID;  Surgeon: Algernon Huxley, MD;  Location: [facility] ORS;  Service: Vascular;  Laterality: Left;    Current Outpatient Rx  Name  Route  Sig  Dispense  Refill  . acetaminophen (TYLENOL) 325 MG tablet   Oral   Take 650 mg by mouth every 6 (six) hours as needed for mild pain or headache.         . albuterol (PROVENTIL HFA;VENTOLIN HFA) 108 (90 BASE) MCG/ACT inhaler   Inhalation   Inhale 2 puffs into the lungs every 6 (six) hours as needed for wheezing or shortness of breath.          Marland Kitchen aspirin EC 81 MG EC tablet   Oral   Take 1 tablet (81 mg total) by mouth daily.   90 tablet   3   . atorvastatin (LIPITOR) 40 MG tablet   Oral   Take 1 tablet (40 mg total) by mouth daily at 6 PM.   30 tablet   2   . budesonide-formoterol (SYMBICORT) 80-4.5 MCG/ACT inhaler   Inhalation   Inhale 2 puffs into the lungs 2 (two) times daily.   1 Inhaler  2   . clonazePAM (KLONOPIN) 1 MG tablet   Oral   Take 1 mg by mouth 3 (three) times daily.          . clopidogrel (PLAVIX) 75 MG tablet   Oral   Take 1 tablet (75 mg total) by mouth daily with breakfast.   30 tablet   3   . docusate sodium (COLACE) 100 MG capsule   Oral   Take 100 mg by mouth 2 (two) times daily as needed for mild constipation.         . DULoxetine (CYMBALTA) 30 MG capsule   Oral   Take 90 mg by mouth daily.         Marland Kitchen gabapentin (NEURONTIN) 300 MG capsule   Oral   Take 300 mg by mouth 4 (four) times daily.         Marland Kitchen glipiZIDE (GLUCOTROL XL) 5 MG 24 hr tablet   Oral   Take 5 mg by mouth daily.         Marland Kitchen lisinopril (PRINIVIL,ZESTRIL) 20 MG tablet   Oral   Take 20 mg by mouth daily.         .  metFORMIN (GLUCOPHAGE) 500 MG tablet   Oral   Take 500 mg by mouth 2 (two) times daily with a meal.          . metoprolol (TOPROL-XL) 50 MG 24 hr tablet   Oral   Take 50 mg by mouth daily.           Marland Kitchen omeprazole (PRILOSEC) 20 MG capsule   Oral   Take 20 mg by mouth 2 (two) times daily.          Marland Kitchen oxyCODONE-acetaminophen (PERCOCET/ROXICET) 5-325 MG tablet   Oral   Take 1-2 tablets by mouth every 4 (four) hours as needed for moderate pain.   30 tablet   0   . traZODone (DESYREL) 100 MG tablet   Oral   Take 100 mg by mouth at bedtime.            Allergies Promethazine hcl; Ciprofloxacin; Doxycycline; Imitrex; Morphine and related; Penicillins; Zantac; and Ketorolac tromethamine  Family History  Problem Relation Age of Onset  . Hypertension Mother   . Heart attack Mother   . Diabetes Mother   . Diabetes Father   . Diabetes Brother   . Anxiety disorder Mother   . Stroke Mother   . Heart attack Mother   . Alcohol abuse      siblings  . Hypertension Brother     Social History Social History  Substance Use Topics  . Smoking status: Current Every Day Smoker -- 1.00 packs/day    Types: Cigarettes  . Smokeless tobacco: Never Used  . Alcohol Use: No    Review of Systems  Constitutional: Negative for fever. Cardiovascular: Negative for chest pain. Respiratory: Negative for shortness of breath. Gastrointestinal: Negative for abdominal pain, vomiting and diarrhea. Genitourinary: Negative for dysuria. Musculoskeletal: Negative for back pain. Skin: Negative for rash. Neurological: Negative for headaches, focal weakness or numbness.  10-point ROS otherwise negative.  ____________________________________________   PHYSICAL EXAM:  VITAL SIGNS: ED Triage Vitals  Enc Vitals Group     BP 05/20/15 1240 181/70 mmHg     Pulse Rate 05/20/15 1240 66     Resp 05/20/15 1240 17     Temp 05/20/15 1240 98.4 F (36.9 C)     Temp Source 05/20/15 1240 Oral     SpO2  05/20/15 1240 95 %     Weight 05/20/15 1240 160 lb (72.576 kg)     Height 05/20/15 1240 5\' 2"  (1.575 m)     Head Cir --      Peak Flow --      Pain Score 05/20/15 1240 10   Constitutional: Alert and oriented. Well appearing and in no distress. Eyes: Conjunctivae are normal. PERRL. Normal extraocular movements. ENT   Head: Normocephalic and atraumatic.   Nose: No congestion/rhinnorhea.   Mouth/Throat: Mucous membranes are moist.   Neck: No stridor. Hematological/Lymphatic/Immunilogical: No cervical lymphadenopathy. Cardiovascular: Normal rate, regular rhythm.  No murmurs, rubs, or gallops. Respiratory: Normal respiratory effort without tachypnea nor retractions. Breath sounds are clear and equal bilaterally. No wheezes/rales/rhonchi. Gastrointestinal: Soft and nontender. No distention. There is no CVA tenderness. Genitourinary: Deferred Musculoskeletal: Normal range of motion in all extremities. No joint effusions.  No lower extremity tenderness nor edema. Neurologic:  Normal speech and language. Strength 5/5 in upper and lower extremities. Finger to nose normal bilaterally. Romberg normal. Speech is normal.  Skin:  Skin is warm, dry and intact. No rash noted. Psychiatric: Mood and affect are normal. Speech and behavior are normal. Patient exhibits appropriate insight and judgment.  ____________________________________________    LABS (pertinent positives/negatives)  None  ____________________________________________   EKG  None  ____________________________________________    RADIOLOGY  None   ____________________________________________   PROCEDURES  Procedure(s) performed: None  Critical Care performed: No  ____________________________________________   INITIAL IMPRESSION / ASSESSMENT AND PLAN / ED COURSE  Pertinent labs & imaging results that were available during my care of the patient were reviewed by me and considered in my medical  decision making (see chart for details).  Patient presents to the emergency department today after a fall. She attributes this to Percocet use and being tired after waking up. On exam patient without any focal neuro deficits. No signs of cerebellar infarct. Patient did recently undergo carotid endarteriectomy 2 days ago. Given that it has been a couple of days I think unlikely that the fall was related to the surgery. Additionally no focal neuro findings to suggest stroke or dissection. I did offer to perform blood work however the patient declined stating she knows why she fell and it was do to the drugs and being tired.   ____________________________________________   FINAL CLINICAL IMPRESSION(S) / ED DIAGNOSES  Final diagnoses:  Fall, initial encounter  Dizziness     Nance Pear, MD 05/21/15 [phone]

## 2015-05-20 NOTE — ED Notes (Signed)
Pt here from [facility] with c/o dizziness this am  Pt reports that she had carotid artery surgery yesterday and this am when she got up she fell in the floor and was then incontinent   She denies hitting her head  Pt also reports that she has not had any of her medications today and wonders if her not having her medicine has made her dizzy.

## 2015-05-20 NOTE — ED Notes (Signed)
Carotid endarterectomy performed yesterday  - left side   Closure site WNL - remains closed with no dehisce

## 2015-07-11 ENCOUNTER — Encounter: Payer: Self-pay | Admitting: *Deleted

## 2015-07-11 ENCOUNTER — Emergency Department
Admission: EM | Admit: 2015-07-11 | Discharge: 2015-07-11 | Discharge status: Against Medical Advice | Payer: Medicare Other | Attending: Emergency Medicine | Admitting: Emergency Medicine

## 2015-07-11 DIAGNOSIS — I10 Essential (primary) hypertension: Secondary | ICD-10-CM | POA: Insufficient documentation

## 2015-07-11 DIAGNOSIS — E119 Type 2 diabetes mellitus without complications: Secondary | ICD-10-CM | POA: Diagnosis not present

## 2015-07-11 DIAGNOSIS — R42 Dizziness and giddiness: Secondary | ICD-10-CM | POA: Insufficient documentation

## 2015-07-11 DIAGNOSIS — F17210 Nicotine dependence, cigarettes, uncomplicated: Secondary | ICD-10-CM | POA: Insufficient documentation

## 2015-07-11 LAB — CBC
HCT: 40 % (ref 35.0–47.0)
Hemoglobin: 13.5 g/dL (ref 12.0–16.0)
MCH: 30.9 pg (ref 26.0–34.0)
MCHC: 33.7 g/dL (ref 32.0–36.0)
MCV: 91.6 fL (ref 80.0–100.0)
PLATELETS: 181 10*3/uL (ref 150–440)
RBC: 4.37 MIL/uL (ref 3.80–5.20)
RDW: 13.4 % (ref 11.5–14.5)
WBC: 11.1 10*3/uL — AB (ref 3.6–11.0)

## 2015-07-11 LAB — BASIC METABOLIC PANEL
Anion gap: 7 (ref 5–15)
BUN: 15 mg/dL (ref 6–20)
CHLORIDE: 102 mmol/L (ref 101–111)
CO2: 28 mmol/L (ref 22–32)
CREATININE: 0.79 mg/dL (ref 0.44–1.00)
Calcium: 9.3 mg/dL (ref 8.9–10.3)
Glucose, Bld: 266 mg/dL — ABNORMAL HIGH (ref 65–99)
Potassium: 4.4 mmol/L (ref 3.5–5.1)
SODIUM: 137 mmol/L (ref 135–145)

## 2015-07-11 LAB — GLUCOSE, CAPILLARY: GLUCOSE-CAPILLARY: 257 mg/dL — AB (ref 65–99)

## 2015-07-11 NOTE — ED Notes (Signed)
Pt reports diziness and a high blood sugar of 290(per EMS), pt denies any other symptoms, pt lives in a [facility] group home[facility] per EMS

## 2015-07-12 ENCOUNTER — Emergency Department
Admission: EM | Admit: 2015-07-12 | Discharge: 2015-07-12 | Disposition: A | Discharge status: Home / Self Care | Payer: Medicare Other | Attending: Emergency Medicine | Admitting: Emergency Medicine

## 2015-07-12 ENCOUNTER — Encounter: Payer: Self-pay | Admitting: Emergency Medicine

## 2015-07-12 DIAGNOSIS — R51 Headache: Secondary | ICD-10-CM | POA: Insufficient documentation

## 2015-07-12 DIAGNOSIS — I10 Essential (primary) hypertension: Secondary | ICD-10-CM | POA: Insufficient documentation

## 2015-07-12 DIAGNOSIS — E1165 Type 2 diabetes mellitus with hyperglycemia: Secondary | ICD-10-CM | POA: Diagnosis not present

## 2015-07-12 DIAGNOSIS — R110 Nausea: Secondary | ICD-10-CM | POA: Diagnosis present

## 2015-07-12 DIAGNOSIS — R109 Unspecified abdominal pain: Secondary | ICD-10-CM | POA: Diagnosis not present

## 2015-07-12 DIAGNOSIS — Z880 Allergy status to penicillin: Secondary | ICD-10-CM | POA: Diagnosis not present

## 2015-07-12 DIAGNOSIS — F17210 Nicotine dependence, cigarettes, uncomplicated: Secondary | ICD-10-CM | POA: Diagnosis not present

## 2015-07-12 DIAGNOSIS — R739 Hyperglycemia, unspecified: Secondary | ICD-10-CM

## 2015-07-12 DIAGNOSIS — R42 Dizziness and giddiness: Secondary | ICD-10-CM | POA: Insufficient documentation

## 2015-07-12 LAB — COMPREHENSIVE METABOLIC PANEL
ALBUMIN: 3.5 g/dL (ref 3.5–5.0)
ALT: 21 U/L (ref 14–54)
ANION GAP: 7 (ref 5–15)
AST: 21 U/L (ref 15–41)
Alkaline Phosphatase: 73 U/L (ref 38–126)
BUN: 17 mg/dL (ref 6–20)
CHLORIDE: 101 mmol/L (ref 101–111)
CO2: 25 mmol/L (ref 22–32)
Calcium: 8.7 mg/dL — ABNORMAL LOW (ref 8.9–10.3)
Creatinine, Ser: 0.71 mg/dL (ref 0.44–1.00)
GFR calc non Af Amer: >60 mL/min (ref >60)
GLUCOSE: 324 mg/dL — AB (ref 65–99)
POTASSIUM: 4.4 mmol/L (ref 3.5–5.1)
SODIUM: 133 mmol/L — AB (ref 135–145)
Total Bilirubin: 0.7 mg/dL (ref 0.3–1.2)
Total Protein: 6.5 g/dL (ref 6.5–8.1)

## 2015-07-12 LAB — CBC WITH DIFFERENTIAL/PLATELET
BASOS PCT: 1 %
Basophils Absolute: 0 10*3/uL (ref 0–0.1)
EOS ABS: 0.2 10*3/uL (ref 0–0.7)
EOS PCT: 4 %
HCT: 35.5 % (ref 35.0–47.0)
HEMOGLOBIN: 11.8 g/dL — AB (ref 12.0–16.0)
Lymphocytes Relative: 41 %
Lymphs Abs: 2.7 10*3/uL (ref 1.0–3.6)
MCH: 30.4 pg (ref 26.0–34.0)
MCHC: 33.1 g/dL (ref 32.0–36.0)
MCV: 91.8 fL (ref 80.0–100.0)
MONO ABS: 0.5 10*3/uL (ref 0.2–0.9)
MONOS PCT: 7 %
NEUTROS PCT: 47 %
Neutro Abs: 3.1 10*3/uL (ref 1.4–6.5)
PLATELETS: 153 10*3/uL (ref 150–440)
RBC: 3.87 MIL/uL (ref 3.80–5.20)
RDW: 13.6 % (ref 11.5–14.5)
WBC: 6.5 10*3/uL (ref 3.6–11.0)

## 2015-07-12 LAB — URINALYSIS COMPLETE WITH MICROSCOPIC (ARMC ONLY)
BILIRUBIN URINE: NEGATIVE
Bacteria, UA: NONE SEEN
Glucose, UA: >500 mg/dL — AB
Hgb urine dipstick: NEGATIVE
Ketones, ur: NEGATIVE mg/dL
NITRITE: NEGATIVE
PH: 5 (ref 5.0–8.0)
Protein, ur: NEGATIVE mg/dL
Specific Gravity, Urine: 1.024 (ref 1.005–1.030)

## 2015-07-12 LAB — TROPONIN I: Troponin I: <0.03 ng/mL (ref <0.031)

## 2015-07-12 MED ORDER — SODIUM CHLORIDE 0.9 % IV SOLN
1000.0000 mL | Freq: Once | INTRAVENOUS | Status: AC
  Administered 2015-07-12: 1000 mL via INTRAVENOUS

## 2015-07-12 MED ORDER — ONDANSETRON HCL 4 MG/2ML IJ SOLN
4.0000 mg | Freq: Once | INTRAMUSCULAR | Status: AC
  Administered 2015-07-12: 4 mg via INTRAVENOUS
  Filled 2015-07-12: qty 2

## 2015-07-12 NOTE — Discharge Instructions (Signed)
Hyperglycemia °Hyperglycemia occurs when the glucose (sugar) in your blood is too high. Hyperglycemia can happen for many reasons, but it most often happens to people who do not know they have diabetes or are not managing their diabetes properly.  °CAUSES  °Whether you have diabetes or not, there are other causes of hyperglycemia. Hyperglycemia can occur when you have diabetes, but it can also occur in other situations that you might not be as aware of, such as: °Diabetes °· If you have diabetes and are having problems controlling your blood glucose, hyperglycemia could occur because of some of the following reasons: °¨ Not following your meal plan. °¨ Not taking your diabetes medications or not taking it properly. °¨ Exercising less or doing less activity than you normally do. °¨ Being sick. °Pre-diabetes °· This cannot be ignored. Before people develop Type 2 diabetes, they almost always have "pre-diabetes." This is when your blood glucose levels are higher than normal, but not yet high enough to be diagnosed as diabetes. Research has shown that some long-term damage to the body, especially the heart and circulatory system, may already be occurring during pre-diabetes. If you take action to manage your blood glucose when you have pre-diabetes, you may delay or prevent Type 2 diabetes from developing. °Stress °· If you have diabetes, you may be "diet" controlled or on oral medications or insulin to control your diabetes. However, you may find that your blood glucose is higher than usual in the hospital whether you have diabetes or not. This is often referred to as "stress hyperglycemia." Stress can elevate your blood glucose. This happens because of hormones put out by the body during times of stress. If stress has been the cause of your high blood glucose, it can be followed regularly by your caregiver. That way he/she can make sure your hyperglycemia does not continue to get worse or progress to  diabetes. °Steroids °· Steroids are medications that act on the infection fighting system (immune system) to block inflammation or infection. One side effect can be a rise in blood glucose. Most people can produce enough extra insulin to allow for this rise, but for those who cannot, steroids make blood glucose levels go even higher. It is not unusual for steroid treatments to "uncover" diabetes that is developing. It is not always possible to determine if the hyperglycemia will go away after the steroids are stopped. A special blood test called an A1c is sometimes done to determine if your blood glucose was elevated before the steroids were started. °SYMPTOMS °· Thirsty. °· Frequent urination. °· Dry mouth. °· Blurred vision. °· Tired or fatigue. °· Weakness. °· Sleepy. °· Tingling in feet or leg. °DIAGNOSIS  °Diagnosis is made by monitoring blood glucose in one or all of the following ways: °· A1c test. This is a chemical found in your blood. °· Fingerstick blood glucose monitoring. °· Laboratory results. °TREATMENT  °First, knowing the cause of the hyperglycemia is important before the hyperglycemia can be treated. Treatment may include, but is not be limited to: °· Education. °· Change or adjustment in medications. °· Change or adjustment in meal plan. °· Treatment for an illness, infection, etc. °· More frequent blood glucose monitoring. °· Change in exercise plan. °· Decreasing or stopping steroids. °· Lifestyle changes. °HOME CARE INSTRUCTIONS  °· Test your blood glucose as directed. °· Exercise regularly. Your caregiver will give you instructions about exercise. Pre-diabetes or diabetes which comes on with stress is helped by exercising. °· Eat wholesome,   balanced meals. Eat often and at regular, fixed times. Your caregiver or nutritionist will give you a meal plan to guide your sugar intake. °· Being at an ideal weight is important. If needed, losing as little as 10 to 15 pounds may help improve blood  glucose levels. °SEEK MEDICAL CARE IF:  °· You have questions about medicine, activity, or diet. °· You continue to have symptoms (problems such as increased thirst, urination, or weight gain). °SEEK IMMEDIATE MEDICAL CARE IF:  °· You are vomiting or have diarrhea. °· Your breath smells fruity. °· You are breathing faster or slower. °· You are very sleepy or incoherent. °· You have numbness, tingling, or pain in your feet or hands. °· You have chest pain. °· Your symptoms get worse even though you have been following your caregiver's orders. °· If you have any other questions or concerns. °  °This information is not intended to replace advice given to you by your health care provider. Make sure you discuss any questions you have with your health care provider. °  °Document Released: 01/02/2001 Document Revised: 10/01/2011 Document Reviewed: 03/15/2015 °Elsevier Interactive Patient Education ©2016 [employer]. ° °

## 2015-07-12 NOTE — ED Provider Notes (Signed)
[facility] Emergency Department Provider Note     Time seen: ----------------------------------------- 9:52 AM on 07/12/2015 -----------------------------------------    I have reviewed the triage vital signs and the nursing notes.   HISTORY  Chief Complaint No chief complaint on file.    HPI Tammy Armstrong is a 64 y.o. female who presents ER for dizziness and high blood sugar. According to report blood sugar in route was 290. 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 She is also describes some nausea to me with generalized abdominal pain. Nothing makes her symptoms better or worse.   Past Medical History  Diagnosis Date  . Migraines   . Hypertension   . Hepatitis C   . Anxiety   . Stroke ([place])   . Seizure ([place]) 07/02/2011  . 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 Respiratory failure ([place]) 07/04/2011  . COPD (chronic obstructive pulmonary disease) ([place]) 07/04/2011  . Hypercholesterolemia   . Diabetes mellitus   . Diabetes mellitus 07/04/2011  . Depression   . Carotid artery occlusion   . Shortness of breath dyspnea   . Cigarette smoker one half pack a day or less   . GERD (gastroesophageal reflux disease)   . Arthritis   . Pancreatitis     Patient Active Problem List   Diagnosis Date Noted  . Carotid stenosis 04/08/2015  . Migraine 04/06/2015  . Dementia with behavioral disturbance 01/19/2015  . Diabetes mellitus 07/04/2011  . Anxiety 07/04/2011  . COPD (chronic obstructive pulmonary disease) ([place]) 07/04/2011  . Purulent bronchitis ([place]) 07/04/2011  . 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 Abdominal hysterectomy    . Cholecystectomy    . Back surgery  2012    cyst removed from spine , [place]  . Appendectomy    . Endarterectomy Left 05/18/2015    Procedure: ENDARTERECTOMY CAROTID;  Surgeon: Algernon Huxley, MD;  Location: [facility] ORS;  Service:  Vascular;  Laterality: Left;    Allergies Promethazine hcl; Ciprofloxacin; Doxycycline; Imitrex; Morphine and related; Penicillins; Zantac; and Ketorolac tromethamine  Social History Social History  Substance Use Topics  . Smoking status: Current Every Day Smoker -- 1.00 packs/day    Types: Cigarettes  . Smokeless tobacco: Never Used  . Alcohol Use: No    Review of Systems Constitutional: Negative for fever. Eyes: Negative for visual changes. ENT: Negative for sore throat. Cardiovascular: Negative for chest pain. Respiratory: Negative for shortness of breath. Gastrointestinal: Positive for abdominal pain and nausea Genitourinary: Negative for dysuria. Musculoskeletal: Negative for back pain. Skin: Negative for rash. Neurological: Negative for headaches, focal weakness or numbness. Positive for dizziness 10-point ROS otherwise negative.  ____________________________________________   PHYSICAL EXAM:  VITAL SIGNS: ED Triage Vitals  Enc Vitals Group     BP --      Pulse --      Resp --      Temp --      Temp src --      SpO2 --      Weight --      Height --      Head Cir --      Peak Flow --      Pain Score --      Pain Loc --      Pain Edu? --      Excl. in [place]? --     Constitutional: Alert and oriented. Well appearing and  in no distress. Eyes: Conjunctivae are normal. PERRL. Normal extraocular movements. ENT   Head: Normocephalic and atraumatic.   Nose: No congestion/rhinnorhea.   Mouth/Throat: Mucous membranes are moist.   Neck: No stridor. Cardiovascular: Normal rate, regular rhythm. Normal and symmetric distal pulses are present in all extremities. No murmurs, rubs, or gallops. Respiratory: Normal respiratory effort without tachypnea nor retractions. Breath sounds are clear and equal bilaterally. No wheezes/rales/rhonchi. Gastrointestinal: Soft and nontender. No distention. No abdominal bruits.  Musculoskeletal: Nontender with normal range of  motion in all extremities. No joint effusions.  No lower extremity tenderness nor edema. Neurologic:  Normal speech and language. No gross focal neurologic deficits are appreciated. Speech is normal. No gait instability. Skin:  Skin is warm, dry and intact. No rash noted. Psychiatric: Mood and affect are normal. Speech and behavior are normal. Patient exhibits appropriate insight and judgment. ____________________________________________  EKG: Interpreted by me. Normal sinus rhythm with a rate of 61 bpm, normal PR interval, normal QS with, normal QT interval.  ____________________________________________  ED COURSE:  Pertinent labs & imaging results that were available during my care of the patient were reviewed by me and considered in my medical decision making (see chart for details). She is in no acute distress, will check basic labs and will receive IV fluids and reevaluation. ____________________________________________    LABS (pertinent positives/negatives)  Labs Reviewed  CBC WITH DIFFERENTIAL/PLATELET - Abnormal; Notable for the following:    Hemoglobin 11.8 (*)    All other components within normal limits  COMPREHENSIVE METABOLIC PANEL - Abnormal; Notable for the following:    Sodium 133 (*)    Glucose, Bld 324 (*)    Calcium 8.7 (*)    All other components within normal limits  TROPONIN I  URINALYSIS COMPLETEWITH MICROSCOPIC ([facility] ONLY)  ____________________________________________  FINAL ASSESSMENT AND PLAN  Hyperglycemia  Plan: Patient with labs as dictated above. Patient has received saline hydration here, is requesting to leave we'll do something for her. Advised her she is received fluids antiemetics we checked lab work. She is leaving before the urinalysis is resulted.   Earleen Newport, MD   Earleen Newport, MD 07/12/15 [phone]

## 2015-07-12 NOTE — ED Notes (Signed)
Pt c/o headache, dizziness and nausea.  Skin w/d with good color, MAE, no facial droop noted.

## 2015-07-12 NOTE — ED Notes (Signed)
Patient spoke with MD and requested to leave. Patient was discharged

## 2015-08-03 ENCOUNTER — Encounter: Payer: Self-pay | Admitting: Emergency Medicine

## 2015-08-03 ENCOUNTER — Emergency Department: Payer: Medicare Other

## 2015-08-03 ENCOUNTER — Emergency Department
Admission: EM | Admit: 2015-08-03 | Discharge: 2015-08-03 | Disposition: A | Discharge status: Home / Self Care | Payer: Medicare Other | Attending: Emergency Medicine | Admitting: Emergency Medicine

## 2015-08-03 DIAGNOSIS — E119 Type 2 diabetes mellitus without complications: Secondary | ICD-10-CM | POA: Diagnosis not present

## 2015-08-03 DIAGNOSIS — S8992XA Unspecified injury of left lower leg, initial encounter: Secondary | ICD-10-CM | POA: Diagnosis not present

## 2015-08-03 DIAGNOSIS — Y9389 Activity, other specified: Secondary | ICD-10-CM | POA: Insufficient documentation

## 2015-08-03 DIAGNOSIS — Y9289 Other specified places as the place of occurrence of the external cause: Secondary | ICD-10-CM | POA: Diagnosis not present

## 2015-08-03 DIAGNOSIS — F17210 Nicotine dependence, cigarettes, uncomplicated: Secondary | ICD-10-CM | POA: Insufficient documentation

## 2015-08-03 DIAGNOSIS — W06XXXA Fall from bed, initial encounter: Secondary | ICD-10-CM | POA: Insufficient documentation

## 2015-08-03 DIAGNOSIS — S42302A Unspecified fracture of shaft of humerus, left arm, initial encounter for closed fracture: Secondary | ICD-10-CM

## 2015-08-03 DIAGNOSIS — I10 Essential (primary) hypertension: Secondary | ICD-10-CM | POA: Insufficient documentation

## 2015-08-03 DIAGNOSIS — Z880 Allergy status to penicillin: Secondary | ICD-10-CM | POA: Diagnosis not present

## 2015-08-03 DIAGNOSIS — W19XXXA Unspecified fall, initial encounter: Secondary | ICD-10-CM

## 2015-08-03 DIAGNOSIS — S42292A Other displaced fracture of upper end of left humerus, initial encounter for closed fracture: Secondary | ICD-10-CM | POA: Diagnosis not present

## 2015-08-03 DIAGNOSIS — S4992XA Unspecified injury of left shoulder and upper arm, initial encounter: Secondary | ICD-10-CM | POA: Diagnosis present

## 2015-08-03 DIAGNOSIS — Y998 Other external cause status: Secondary | ICD-10-CM | POA: Diagnosis not present

## 2015-08-03 MED ORDER — OXYCODONE-ACETAMINOPHEN 5-325 MG PO TABS: 2.0000 | ORAL_TABLET | Freq: Four times a day (QID) | ORAL | Status: DC | PRN

## 2015-08-03 MED ORDER — ONDANSETRON 4 MG PO TBDP
4.0000 mg | ORAL_TABLET | Freq: Once | ORAL | Status: AC
  Administered 2015-08-03: 4 mg via ORAL
  Filled 2015-08-03: qty 1

## 2015-08-03 MED ORDER — OXYCODONE-ACETAMINOPHEN 5-325 MG PO TABS
ORAL_TABLET | ORAL | Status: AC
  Filled 2015-08-03: qty 1

## 2015-08-03 MED ORDER — OXYCODONE-ACETAMINOPHEN 5-325 MG PO TABS
2.0000 | ORAL_TABLET | Freq: Once | ORAL | Status: AC
  Administered 2015-08-03: 2 via ORAL
  Filled 2015-08-03: qty 2

## 2015-08-03 MED ORDER — ONDANSETRON HCL 4 MG PO TABS: 4.0000 mg | ORAL_TABLET | Freq: Every day | ORAL | Status: DC | PRN

## 2015-08-03 NOTE — ED Provider Notes (Signed)
[facility] Emergency Department Provider Note     Time seen: ----------------------------------------- 2:24 PM on 08/03/2015 -----------------------------------------    I have reviewed the triage vital signs and the nursing notes.   HISTORY  Chief Complaint Fall and Knee Pain    HPI Tammy Armstrong is a 65 y.o. female presents ER after mechanical fall at 9:30 this morning. Patient states she fell over her bed and landed on her left knee and left shoulder. Patient states she was helped up by people that she lives with. Patient states she said severe pain, movement of the left arm or left leg makes it worse.   Past Medical History  Diagnosis Date  . Migraines   . Hypertension   . Hepatitis C   . Anxiety   . Stroke ([place])   . Seizure ([place]) 07/02/2011  . 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 Respiratory failure ([place]) 07/04/2011  . COPD (chronic obstructive pulmonary disease) ([place]) 07/04/2011  . Hypercholesterolemia   . Diabetes mellitus   . Diabetes mellitus 07/04/2011  . Depression   . Carotid artery occlusion   . Shortness of breath dyspnea   . Cigarette smoker one half pack a day or less   . GERD (gastroesophageal reflux disease)   . Arthritis   . Pancreatitis     Patient Active Problem List   Diagnosis Date Noted  . Carotid stenosis 04/08/2015  . Migraine 04/06/2015  . Dementia with behavioral disturbance 01/19/2015  . Diabetes mellitus 07/04/2011  . Anxiety 07/04/2011  . COPD (chronic obstructive pulmonary disease) ([place]) 07/04/2011  . Purulent bronchitis ([place]) 07/04/2011  . 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 Abdominal hysterectomy    . Cholecystectomy    . Back surgery  2012    cyst removed from spine , [place]  . Appendectomy    . Endarterectomy Left 05/18/2015    Procedure: ENDARTERECTOMY CAROTID;  Surgeon: Algernon Huxley, MD;  Location: [facility] ORS;  Service: Vascular;   Laterality: Left;    Allergies Promethazine hcl; Ciprofloxacin; Doxycycline; Imitrex; Morphine and related; Penicillins; Zantac; and Ketorolac tromethamine  Social History Social History  Substance Use Topics  . Smoking status: Current Every Day Smoker -- 1.00 packs/day    Types: Cigarettes  . Smokeless tobacco: Never Used  . Alcohol Use: No    Review of Systems Constitutional: Negative for fever. Eyes: Negative for visual changes. ENT: Negative for sore throat. Cardiovascular: Negative for chest pain. Respiratory: Negative for shortness of breath. Gastrointestinal: Negative for abdominal pain, vomiting and diarrhea. Genitourinary: Negative for dysuria. Musculoskeletal: Positive for left arm and left leg pain Skin: Negative for rash. Neurological: Negative for headaches, focal weakness or numbness.  10-point ROS otherwise negative.  ____________________________________________   PHYSICAL EXAM:  VITAL SIGNS: ED Triage Vitals  Enc Vitals Group     BP 08/03/15 1300 145/65 mmHg     Pulse Rate 08/03/15 1300 66     Resp 08/03/15 1300 20     Temp 08/03/15 1300 98.5 F (36.9 C)     Temp Source 08/03/15 1300 Oral     SpO2 08/03/15 1300 99 %     Weight 08/03/15 1300 154 lb (69.854 kg)     Height 08/03/15 1300 5\' 2"  (1.575 m)     Head Cir --      Peak Flow --      Pain Score 08/03/15 1301 10     Pain Loc --  Pain Edu? --      Excl. in [place]? --     Constitutional: Alert and oriented, Mild distress from pain is noted Eyes:  Conjunctivae are normal.Normal extraocular movements. ENT   Head: Normocephalic and atraumatic.   Nose: No congestion/rhinnorhea.   Mouth/Throat: Mucous membranes are moist.   Neck: No stridor. Cardiovascular: Normal rate, regular rhythm. Normal and symmetric distal pulses are present in all extremities. No murmurs, rubs, or gallops. Respiratory: Normal respiratory effort without tachypnea nor retractions. Breath sounds are clear and  equal bilaterally. No wheezes/rales/rhonchi. Gastrointestinal: Soft and nontender. No distention. No abdominal bruits.  Musculoskeletal: severe pain with rom of the left shoulder and left knee, no bruising is noted, possible left shoulder effusion. Neurologic:  Normal speech and language. No gross focal neurologic deficits are appreciated. Speech is normal. No gait instability. Skin:  Skin is warm, dry and intact. No rash noted. Psychiatric: Mood and affect are normal. Speech and behavior are normal. Patient exhibits appropriate insight and judgment. ____________________________________________  ED COURSE:  Pertinent labs & imaging results that were available during my care of the patient were reviewed by me and considered in my medical decision making (see chart for details). Patient with mechanical fall, will obtain x-rays and give oral pain medicine and reevaluate.   RADIOLOGY Images were viewed by me   Left shoulder x-rays, left knee x-rays  IMPRESSION: Fracture of the greater tuberosity. IMPRESSION: Questionable calcified loose bodies at the posterior aspect of the medial compartment.  No acute abnormalities. ____________________________________________  FINAL ASSESSMENT AND PLAN   Fall, avulsion fracture  Plan: Patient with labs and imaging as dictated above. Patient is in no acute distress, will be placed in a sling and given pain medicine to use. She is advised to use Ace bandage for left knee. She is stable for outpatient follow-up with orthopedics.   Earleen Newport, MD   Earleen Newport, MD 08/03/15 [phone]

## 2015-08-03 NOTE — ED Notes (Addendum)
Pt to ed with c/o fall about 0930 today.  Pt states she fell over her bed and landed on her left knee and left elbow and left shoulder.  Pt states she was helped up by people that she lives with.  Pt reports "I am in so much pain"  Pt alert and oriented and states she hit her head on the wall.  No bruising noted at this time.

## 2015-08-03 NOTE — Discharge Instructions (Signed)
Humerus Fracture Treated With Immobilization The humerus is the large bone in the upper arm. A broken (fractured) humerus is often treated by wearing a cast, splint, or sling (immobilization). This holds the broken pieces in place so they can heal.  HOME CARE  Put ice on the injured area.  Put ice in a plastic bag.  Place a towel between your skin and the bag.  Leave the ice on for 15-20 minutes, 03-04 times a day.  If you are given a cast:  Do not scratch the skin under the cast.  Check the skin around the cast every day. You may put lotion on any red or sore areas.  Keep the cast dry and clean.  If you are given a splint:  Wear the splint as told.  Keep the splint clean and dry.  Loosen the elastic around the splint if your fingers become numb, cold, tingle, or turn blue.  If you are given a sling:  Wear the sling as told.  Do not put pressure on any part of the cast or splint until it is fully hardened.  The cast or splint must be protected with a plastic bag during bathing. Do not lower the cast or splint into water.  Only take medicine as told by your doctor.  Do exercises as told by your doctor.  Follow up as told by your doctor. GET HELP RIGHT AWAY IF:   Your skin or fingernails turn blue or gray.  Your arm feels cold or numb.  You have very bad pain in the injured arm.  You are having problems with the medicines you were given. MAKE SURE YOU:   Understand these instructions.  Will watch your condition.  Will get help right away if you are not doing well or get worse.   This information is not intended to replace advice given to you by your health care provider. Make sure you discuss any questions you have with your health care provider.   Document Released: 12/26/2007 Document Revised: 07/30/2014 Document Reviewed: 12/01/2014 Elsevier Interactive Patient Education 2016 [place] in the Home  Falls can cause injuries and can  affect people from all age groups. There are many simple things that you can do to make your home safe and to help prevent falls. WHAT CAN I DO ON THE OUTSIDE OF MY HOME?  Regularly repair the edges of walkways and driveways and fix any cracks.  Remove high doorway thresholds.  Trim any shrubbery on the main path into your home.  Use bright outdoor lighting.  Clear walkways of debris and clutter, including tools and rocks.  Regularly check that handrails are securely fastened and in good repair. Both sides of any steps should have handrails.  Install guardrails along the edges of any raised decks or porches.  Have leaves, snow, and ice cleared regularly.  Use sand or salt on walkways during winter months.  In the garage, clean up any spills right away, including grease or oil spills. WHAT CAN I DO IN THE BATHROOM?  Use night lights.  Install grab bars by the toilet and in the tub and shower. Do not use towel bars as grab bars.  Use non-skid mats or decals on the floor of the tub or shower.  If you need to sit down while you are in the shower, use a plastic, non-slip stool.[place]  Keep the floor dry. Immediately clean up any water that spills on the floor.  Remove soap buildup in  the tub or shower on a regular basis.  Attach bath mats securely with double-sided non-slip rug tape.  Remove throw rugs and other tripping hazards from the floor. WHAT CAN I DO IN THE BEDROOM?  Use night lights.  Make sure that a bedside light is easy to reach.  Do not use oversized bedding that drapes onto the floor.  Have a firm chair that has side arms to use for getting dressed.  Remove throw rugs and other tripping hazards from the floor. WHAT CAN I DO IN THE KITCHEN?   Clean up any spills right away.  Avoid walking on wet floors.  Place frequently used items in easy-to-reach places.  If you need to reach for something above you, use a sturdy step stool that has a grab bar.  Keep  electrical cables out of the way.  Do not use floor polish or wax that makes floors slippery. If you have to use wax, make sure that it is non-skid floor wax.  Remove throw rugs and other tripping hazards from the floor. WHAT CAN I DO IN THE STAIRWAYS?  Do not leave any items on the stairs.  Make sure that there are handrails on both sides of the stairs. Fix handrails that are broken or loose. Make sure that handrails are as long as the stairways.  Check any carpeting to make sure that it is firmly attached to the stairs. Fix any carpet that is loose or worn.  Avoid having throw rugs at the top or bottom of stairways, or secure the rugs with carpet tape to prevent them from moving.  Make sure that you have a light switch at the top of the stairs and the bottom of the stairs. If you do not have them, have them installed. WHAT ARE SOME OTHER FALL PREVENTION TIPS?  Wear closed-toe shoes that fit well and support your feet. Wear shoes that have rubber soles or low heels.  When you use a stepladder, make sure that it is completely opened and that the sides are firmly locked. Have someone hold the ladder while you are using it. Do not climb a closed stepladder.  Add color or contrast paint or tape to grab bars and handrails in your home. Place contrasting color strips on the first and last steps.  Use mobility aids as needed, such as canes, walkers, scooters, and crutches.  Turn on lights if it is dark. Replace any light bulbs that burn out.  Set up furniture so that there are clear paths. Keep the furniture in the same spot.  Fix any uneven floor surfaces.  Choose a carpet design that does not hide the edge of steps of a stairway.  Be aware of any and all pets.  Review your medicines with your healthcare provider. Some medicines can cause dizziness or changes in blood pressure, which increase your risk of falling. Talk with your health care provider about other ways that you can  decrease your risk of falls. This may include working with a physical therapist or trainer to improve your strength, balance, and endurance.   This information is not intended to replace advice given to you by your health care provider. Make sure you discuss any questions you have with your health care provider.   Document Released: 06/29/2002 Document Revised: 11/23/2014 Document Reviewed: 08/13/2014 Elsevier Interactive Patient Education [employer].

## 2015-08-08 ENCOUNTER — Other Ambulatory Visit: Payer: Self-pay | Admitting: Family Medicine

## 2015-08-08 ENCOUNTER — Other Ambulatory Visit: Payer: Self-pay | Admitting: Internal Medicine

## 2015-08-08 DIAGNOSIS — Z1231 Encounter for screening mammogram for malignant neoplasm of breast: Secondary | ICD-10-CM

## 2015-08-19 ENCOUNTER — Ambulatory Visit: Payer: Medicare Other | Attending: Family Medicine

## 2015-09-11 ENCOUNTER — Emergency Department (HOSPITAL_COMMUNITY): Payer: Medicare Other

## 2015-09-11 ENCOUNTER — Emergency Department (HOSPITAL_COMMUNITY)
Admission: EM | Admit: 2015-09-11 | Discharge: 2015-09-11 | Disposition: A | Discharge status: Home / Self Care | Payer: Medicare Other | Attending: Emergency Medicine | Admitting: Emergency Medicine

## 2015-09-11 ENCOUNTER — Encounter (HOSPITAL_COMMUNITY): Payer: Self-pay

## 2015-09-11 DIAGNOSIS — I10 Essential (primary) hypertension: Secondary | ICD-10-CM | POA: Insufficient documentation

## 2015-09-11 DIAGNOSIS — Z8619 Personal history of other infectious and parasitic diseases: Secondary | ICD-10-CM | POA: Diagnosis not present

## 2015-09-11 DIAGNOSIS — Z7982 Long term (current) use of aspirin: Secondary | ICD-10-CM | POA: Insufficient documentation

## 2015-09-11 DIAGNOSIS — S92302A Fracture of unspecified metatarsal bone(s), left foot, initial encounter for closed fracture: Secondary | ICD-10-CM

## 2015-09-11 DIAGNOSIS — Z79899 Other long term (current) drug therapy: Secondary | ICD-10-CM | POA: Insufficient documentation

## 2015-09-11 DIAGNOSIS — S91312A Laceration without foreign body, left foot, initial encounter: Secondary | ICD-10-CM | POA: Insufficient documentation

## 2015-09-11 DIAGNOSIS — E119 Type 2 diabetes mellitus without complications: Secondary | ICD-10-CM | POA: Insufficient documentation

## 2015-09-11 DIAGNOSIS — F329 Major depressive disorder, single episode, unspecified: Secondary | ICD-10-CM | POA: Diagnosis not present

## 2015-09-11 DIAGNOSIS — G43909 Migraine, unspecified, not intractable, without status migrainosus: Secondary | ICD-10-CM | POA: Insufficient documentation

## 2015-09-11 DIAGNOSIS — Y9389 Activity, other specified: Secondary | ICD-10-CM | POA: Insufficient documentation

## 2015-09-11 DIAGNOSIS — S62657A Nondisplaced fracture of medial phalanx of left little finger, initial encounter for closed fracture: Secondary | ICD-10-CM | POA: Diagnosis not present

## 2015-09-11 DIAGNOSIS — Y9289 Other specified places as the place of occurrence of the external cause: Secondary | ICD-10-CM | POA: Insufficient documentation

## 2015-09-11 DIAGNOSIS — Z880 Allergy status to penicillin: Secondary | ICD-10-CM | POA: Diagnosis not present

## 2015-09-11 DIAGNOSIS — K219 Gastro-esophageal reflux disease without esophagitis: Secondary | ICD-10-CM | POA: Insufficient documentation

## 2015-09-11 DIAGNOSIS — S99922A Unspecified injury of left foot, initial encounter: Secondary | ICD-10-CM | POA: Diagnosis present

## 2015-09-11 DIAGNOSIS — Z23 Encounter for immunization: Secondary | ICD-10-CM | POA: Diagnosis not present

## 2015-09-11 DIAGNOSIS — E7800 Pure hypercholesterolemia, unspecified: Secondary | ICD-10-CM | POA: Diagnosis not present

## 2015-09-11 DIAGNOSIS — F17210 Nicotine dependence, cigarettes, uncomplicated: Secondary | ICD-10-CM | POA: Diagnosis not present

## 2015-09-11 DIAGNOSIS — Z7902 Long term (current) use of antithrombotics/antiplatelets: Secondary | ICD-10-CM | POA: Insufficient documentation

## 2015-09-11 DIAGNOSIS — Z8673 Personal history of transient ischemic attack (TIA), and cerebral infarction without residual deficits: Secondary | ICD-10-CM | POA: Insufficient documentation

## 2015-09-11 DIAGNOSIS — J449 Chronic obstructive pulmonary disease, unspecified: Secondary | ICD-10-CM | POA: Diagnosis not present

## 2015-09-11 DIAGNOSIS — M1990 Unspecified osteoarthritis, unspecified site: Secondary | ICD-10-CM | POA: Insufficient documentation

## 2015-09-11 DIAGNOSIS — Y998 Other external cause status: Secondary | ICD-10-CM | POA: Insufficient documentation

## 2015-09-11 DIAGNOSIS — W208XXA Other cause of strike by thrown, projected or falling object, initial encounter: Secondary | ICD-10-CM | POA: Insufficient documentation

## 2015-09-11 DIAGNOSIS — Z7984 Long term (current) use of oral hypoglycemic drugs: Secondary | ICD-10-CM | POA: Insufficient documentation

## 2015-09-11 DIAGNOSIS — F419 Anxiety disorder, unspecified: Secondary | ICD-10-CM | POA: Diagnosis not present

## 2015-09-11 LAB — CBG MONITORING, ED: GLUCOSE-CAPILLARY: 59 mg/dL — AB (ref 65–99)

## 2015-09-11 MED ORDER — BUPIVACAINE HCL 0.5 % IJ SOLN
5.0000 mL | Freq: Once | INTRAMUSCULAR | Status: AC
  Administered 2015-09-11: 5 mL
  Filled 2015-09-11: qty 5

## 2015-09-11 MED ORDER — OXYCODONE-ACETAMINOPHEN 5-325 MG PO TABS: 1.0000 | ORAL_TABLET | ORAL | Status: DC | PRN

## 2015-09-11 MED ORDER — TETANUS-DIPHTH-ACELL PERTUSSIS 5-2.5-18.5 LF-MCG/0.5 IM SUSP
0.5000 mL | Freq: Once | INTRAMUSCULAR | Status: AC
  Administered 2015-09-11: 0.5 mL via INTRAMUSCULAR
  Filled 2015-09-11: qty 0.5

## 2015-09-11 MED ORDER — SODIUM CHLORIDE 0.9 % IV SOLN
Freq: Once | INTRAVENOUS | Status: AC
  Administered 2015-09-11: 19:00:00 via INTRAVENOUS

## 2015-09-11 MED ORDER — ONDANSETRON HCL 4 MG/2ML IJ SOLN
4.0000 mg | Freq: Once | INTRAMUSCULAR | Status: AC
  Administered 2015-09-11: 4 mg via INTRAVENOUS
  Filled 2015-09-11: qty 2

## 2015-09-11 MED ORDER — KETOROLAC TROMETHAMINE 15 MG/ML IJ SOLN
15.0000 mg | Freq: Once | INTRAMUSCULAR | Status: AC
  Administered 2015-09-11: 15 mg via INTRAVENOUS
  Filled 2015-09-11: qty 1

## 2015-09-11 MED ORDER — HYDROMORPHONE HCL 1 MG/ML IJ SOLN
1.0000 mg | Freq: Once | INTRAMUSCULAR | Status: AC
Start: 2015-09-11 — End: 2015-09-11
  Administered 2015-09-11: 1 mg via INTRAVENOUS
  Filled 2015-09-11: qty 1

## 2015-09-11 NOTE — ED Notes (Signed)
Bed: [id number] Expected date:  Expected time:  Means of arrival:  Comments: EMS foot fracture

## 2015-09-11 NOTE — ED Notes (Signed)
She states her TV fell as she was attempting to attach an antenna; landing on her left foot.  She arrives with a splint and dressing which I do not disturb.  Paramedic describes seeing periosteum with wound at ant. Left foot.  She rec'd. A total of 167mcg Fentanyl en route to hospital.

## 2015-09-11 NOTE — ED Notes (Signed)
[facility] here to transport pt ?

## 2015-09-11 NOTE — Discharge Instructions (Signed)
Metatarsal Fracture A metatarsal fracture is a break in a metatarsal bone. Metatarsal bones connect your toe bones to your ankle bones. CAUSES This type of fracture may be caused by:  A sudden twisting of your foot.  A fall onto your foot.  Overuse or repetitive exercise. RISK FACTORS This condition is more likely to develop in people who:  Play contact sports.  Have a bone disease.  Have a low calcium level. SYMPTOMS Symptoms of this condition include:  Pain that is worse when walking or standing.  Pain when pressing on the foot or moving the toes.  Swelling.  Bruising on the top or bottom of the foot.  A foot that appears shorter than the other one. DIAGNOSIS This condition is diagnosed with a physical exam. You may also have imaging tests, such as:  X-rays.  A CT scan.  MRI. TREATMENT Treatment for this condition depends on its severity and whether a bone has moved out of place. Treatment may involve:  Rest.  Wearing foot support such as a cast, splint, or boot for several weeks.  Using crutches.  Surgery to move bones back into the right position. Surgery is usually needed if there are many pieces of broken bone or bones that are very out of place (displaced fracture).  Physical therapy. This may be needed to help you regain full movement and strength in your foot. You will need to return to your health care provider to have X-rays taken until your bones heal. Your health care provider will look at the X-rays to make sure that your foot is healing well. HOME CARE INSTRUCTIONS  If You Have a Cast:  Do not stick anything inside the cast to scratch your skin. Doing that increases your risk of infection.  Check the skin around the cast every day. Report any concerns to your health care provider. You may put lotion on dry skin around the edges of the cast. Do not apply lotion to the skin underneath the cast.  Keep the cast clean and dry. If You Have a Splint  or a Supportive Boot:  Wear it as directed by your health care provider. Remove it only as directed by your health care provider.  Loosen it if your toes become numb and tingle, or if they turn cold and blue.  Keep it clean and dry. Bathing  Do not take baths, swim, or use a hot tub until your health care provider approves. Ask your health care provider if you can take showers. You may only be allowed to take sponge baths for bathing.  If your health care provider approves bathing and showering, cover the cast or splint with a watertight plastic bag to protect it from water. Do not let the cast or splint get wet. Managing Pain, Stiffness, and Swelling  If directed, apply ice to the injured area (if you have a splint, not a cast).  Put ice in a plastic bag.  Place a towel between your skin and the bag.  Leave the ice on for 20 minutes, 2-3 times per day.  Move your toes often to avoid stiffness and to lessen swelling.  Raise (elevate) the injured area above the level of your heart while you are sitting or lying down. Driving  Do not drive or operate heavy machinery while taking pain medicine.  Do not drive while wearing foot support on a foot that you use for driving. Activity  Return to your normal activities as directed by your health care   provider. Ask your health care provider what activities are safe for you.  Perform exercises as directed by your health care provider or physical therapist. Safety  Do not use the injured foot to support your body weight until your health care provider says that you can. Use crutches as directed by your health care provider. General Instructions  Do not put pressure on any part of the cast or splint until it is fully hardened. This may take several hours.  Do not use any tobacco products, including cigarettes, chewing tobacco, or e-cigarettes. Tobacco can delay bone healing. If you need help quitting, ask your health care  provider.  Take medicines only as directed by your health care provider.  Keep all follow-up visits as directed by your health care provider. This is important. SEEK MEDICAL CARE IF:  You have a fever.  Your cast, splint, or boot is too loose or too tight.  Your cast, splint, or boot is damaged.  Your pain medicine is not helping.  You have pain, tingling, or numbness in your foot that is not going away. SEEK IMMEDIATE MEDICAL CARE IF:  You have severe pain.  You have tingling or numbness in your foot that is getting worse.  Your foot feels cold or becomes numb.  Your foot changes color.   This information is not intended to replace advice given to you by your health care provider. Make sure you discuss any questions you have with your health care provider.   Document Released: 03/31/2002 Document Revised: 11/23/2014 Document Reviewed: 05/05/2014 Elsevier Interactive Patient Education 2016 [employer]  

## 2015-09-15 NOTE — ED Provider Notes (Signed)
CSN: [id number]     Arrival date & time 09/11/15  1830 History   First MD Initiated Contact with Patient 09/11/15 1859     Chief Complaint  Patient presents with  . Foot Injury     (Consider location/radiation/quality/duration/timing/severity/associated sxs/prior Treatment) HPI   65 year old female with left laceration of pain after TV fell onto her foot just prior to arrival. Denies any other injuries. No blood thinners.Was able to bear partial weight on it after the injury. EMS wrapped the wound prior to arrival. They felt like they may have seen bone or periosteum? Denies any numbness or tingling.  Past Medical History  Diagnosis Date  . Migraines   . Hypertension   . Hepatitis C   . Anxiety   . Stroke ([place])   . Seizure ([place]) 07/02/2011  . 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 Respiratory failure ([place]) 07/04/2011  . COPD (chronic obstructive pulmonary disease) ([place]) 07/04/2011  . Hypercholesterolemia   . Diabetes mellitus   . Diabetes mellitus 07/04/2011  . Depression   . Carotid artery occlusion   . Shortness of breath dyspnea   . Cigarette smoker one half pack a day or less   . GERD (gastroesophageal reflux disease)   . Arthritis   . Pancreatitis    Past Surgical History  Procedure Laterality Date  . Abdominal hysterectomy    . Cholecystectomy    . Back surgery  2012    cyst removed from spine , [place]  . Appendectomy    . Endarterectomy Left 05/18/2015    Procedure: ENDARTERECTOMY CAROTID;  Surgeon: Algernon Huxley, MD;  Location: [facility] ORS;  Service: Vascular;  Laterality: Left;   Family History  Problem Relation Age of Onset  . Hypertension Mother   . Heart attack Mother   . Diabetes Mother   . Diabetes Father   . Diabetes Brother   . Anxiety disorder Mother   . Stroke Mother   . Heart attack Mother   . Alcohol abuse      siblings  . Hypertension Brother    Social History  Substance Use Topics  . Smoking status: Current Every Day Smoker -- 1.00  packs/day    Types: Cigarettes  . Smokeless tobacco: Never Used  . Alcohol Use: No   OB History    No data available     Review of Systems  All systems reviewed and negative, other than as noted in HPI.   Allergies  Promethazine hcl; Ciprofloxacin; Doxycycline; Imitrex; Morphine and related; Penicillins; Zantac; and Ketorolac tromethamine  Home Medications   Prior to Admission medications   Medication Sig Start Date End Date Taking? Authorizing Provider  albuterol (PROVENTIL HFA;VENTOLIN HFA) 108 (90 BASE) MCG/ACT inhaler Inhale 2 puffs into the lungs every 6 (six) hours as needed for wheezing or shortness of breath.    Yes Historical Provider, MD  aspirin EC 81 MG EC tablet Take 1 tablet (81 mg total) by mouth daily. 05/19/15  Yes Algernon Huxley, MD  atorvastatin (LIPITOR) 40 MG tablet Take 1 tablet (40 mg total) by mouth daily at 6 PM. 04/08/15  Yes Gladstone Lighter, MD  BREO ELLIPTA 100-25 MCG/INH AEPB Inhale 1 puff into the lungs daily. 08/16/15  Yes Historical Provider, MD  budesonide-formoterol (SYMBICORT) 80-4.5 MCG/ACT inhaler Inhale 2 puffs into the lungs 2 (two) times daily. 04/08/15  Yes Gladstone Lighter, MD  busPIRone (BUSPAR) 10 MG tablet Take 10 mg by mouth 2 (two) times daily. 09/09/15  Yes Historical Provider, MD  clonazePAM (KLONOPIN) 1 MG tablet Take 1 mg by mouth 3 (three) times daily.    Yes Historical Provider, MD  clopidogrel (PLAVIX) 75 MG tablet Take 1 tablet (75 mg total) by mouth daily with breakfast. 05/19/15  Yes Algernon Huxley, MD  docusate sodium (COLACE) 100 MG capsule Take 100 mg by mouth daily.    Yes Historical Provider, MD  DULoxetine (CYMBALTA) 60 MG capsule Take 60 mg by mouth 2 (two) times daily.   Yes Historical Provider, MD  gabapentin (NEURONTIN) 300 MG capsule Take 300 mg by mouth 4 (four) times daily.   Yes Historical Provider, MD  glipiZIDE (GLUCOTROL XL) 5 MG 24 hr tablet Take 5 mg by mouth daily.   Yes Historical Provider, MD  ibuprofen  (ADVIL,MOTRIN) 200 MG tablet Take 200 mg by mouth every 6 (six) hours as needed for headache or moderate pain.   Yes Historical Provider, MD  lisinopril (PRINIVIL,ZESTRIL) 20 MG tablet Take 20 mg by mouth daily.   Yes Historical Provider, MD  metFORMIN (GLUCOPHAGE) 500 MG tablet Take 500 mg by mouth 2 (two) times daily with a meal.    Yes Historical Provider, MD  metoprolol (TOPROL-XL) 50 MG 24 hr tablet Take 50 mg by mouth daily.     Yes Historical Provider, MD  omeprazole (PRILOSEC) 20 MG capsule Take 20 mg by mouth 2 (two) times daily.    Yes Historical Provider, MD  ondansetron (ZOFRAN) 4 MG tablet Take 1 tablet (4 mg total) by mouth daily as needed for nausea or vomiting. 08/03/15  Yes Earleen Newport, MD  oxyCODONE-acetaminophen (PERCOCET) 5-325 MG tablet Take 2 tablets by mouth every 6 (six) hours as needed for moderate pain or severe pain. 08/03/15  Yes Earleen Newport, MD  traZODone (DESYREL) 100 MG tablet Take 100 mg by mouth at bedtime.    Yes Historical Provider, MD  oxyCODONE-acetaminophen (PERCOCET/ROXICET) 5-325 MG tablet Take 1 tablet by mouth every 4 (four) hours as needed for severe pain. 09/11/15   Virgel Manifold, MD   BP 134/78 mmHg  Pulse 60  Temp(Src) 98.6 F (37 C) (Oral)  Resp 18  SpO2 92% Physical Exam  Constitutional: She appears well-developed and well-nourished. No distress.  HENT:  Head: Normocephalic and atraumatic.  Eyes: Conjunctivae are normal. Right eye exhibits no discharge. Left eye exhibits no discharge.  Neck: Neck supple.  Cardiovascular: Normal rate, regular rhythm and normal heart sounds.  Exam reveals no gallop and no friction rub.   No murmur heard. Pulmonary/Chest: Effort normal and breath sounds normal. No respiratory distress.  Abdominal: Soft. She exhibits no distension. There is no tenderness.  Musculoskeletal: She exhibits no edema or tenderness.  1.5 cm flap laceration to the mid aspect of the dorsal left foot. Minimal bleeding. Wound  was copiously irrigated and explored. It does not appear to penetrate very deeply. Tenderness over the mid to lateral aspect of the foot with some mild swelling and ecchymosis. Neurovascularly intact distally.  Neurological: She is alert.  Skin: Skin is warm and dry.  Psychiatric: She has a normal mood and affect. Her behavior is normal. Thought content normal.  Nursing note and vitals reviewed.   ED Course  Procedures (including critical care time)  LACERATION REPAIR Performed by: Virgel Manifold Authorized by: Virgel Manifold Consent: Verbal consent obtained. Risks and benefits: risks, benefits and alternatives were discussed Consent given by: patient Patient identity confirmed: provided demographic data Prepped and Draped in normal sterile fashion Wound explored  Laceration Location: dorsum  L foot  Laceration Length: 1.5 cm  No Foreign Bodies seen or palpated  Anesthesia: local infiltration  Local anesthetic: 0.5% marcaine  Anesthetic total: 1 ml  Irrigation method: syringe Amount of cleaning: standard  Skin closure: 3-0 prolene  Number of sutures: 1  Technique: horizontal mattress  Patient tolerance: Patient tolerated the procedure well with no immediate complications.  Labs Review Labs Reviewed  CBG MONITORING, ED - Abnormal; Notable for the following:    Glucose-Capillary 59 (*)    All other components within normal limits    Imaging Review No results found.   Dg Foot Complete Left  09/11/2015  CLINICAL DATA:  Left foot bruising and swelling after dropping television on foot tonight. EXAM: LEFT FOOT - COMPLETE 3+ VIEW COMPARISON:  None. FINDINGS: There is dorsal forefoot soft tissue swelling. There is a nondisplaced fracture of one of the mid metatarsal shafts, best seen on the lateral view. This appears to be within the fifth metatarsal, although is very subtle on the AP and oblique views. Some cortical thickening along the lateral shaft of the fourth  metatarsal does not appear acute. The alignment appears normal at the Lisfranc joint. There is no dislocation. Fragmentation of the lateral malleolus on the oblique view does not appear acute. Small metallic density is noted superficially along the lateral aspect of the small toe. IMPRESSION: Nondisplaced midshaft fracture of the fifth metatarsal. Electronically Signed   By: Richardean Sale M.D.   On: 09/11/2015 19:54   I have personally reviewed and evaluated these images and lab results as part of my medical decision-making.   EKG Interpretation None      MDM   Final diagnoses:  Fracture of fifth metatarsal bone, left, closed, initial encounter  Laceration L foot  [age]-year-old female with left foot pain/laceration after tv fell on it. Nondisplaced midshaft fifth metatarsal fracture. Doubt laceration communicates with this area.  Wound was copiously irrigated and sutured. Continued wound care. Orthopedic follow-up.    Virgel Manifold, MD 09/15/15 2237

## 2015-09-30 ENCOUNTER — Encounter: Payer: Self-pay | Admitting: Emergency Medicine

## 2015-09-30 ENCOUNTER — Emergency Department: Payer: Medicare Other

## 2015-09-30 ENCOUNTER — Emergency Department
Admission: EM | Admit: 2015-09-30 | Discharge: 2015-09-30 | Discharge status: Against Medical Advice | Payer: Medicare Other | Attending: Student | Admitting: Student

## 2015-09-30 DIAGNOSIS — Z79899 Other long term (current) drug therapy: Secondary | ICD-10-CM | POA: Insufficient documentation

## 2015-09-30 DIAGNOSIS — E119 Type 2 diabetes mellitus without complications: Secondary | ICD-10-CM | POA: Insufficient documentation

## 2015-09-30 DIAGNOSIS — Z7951 Long term (current) use of inhaled steroids: Secondary | ICD-10-CM | POA: Insufficient documentation

## 2015-09-30 DIAGNOSIS — R531 Weakness: Secondary | ICD-10-CM

## 2015-09-30 DIAGNOSIS — M79672 Pain in left foot: Secondary | ICD-10-CM | POA: Diagnosis not present

## 2015-09-30 DIAGNOSIS — Z880 Allergy status to penicillin: Secondary | ICD-10-CM | POA: Diagnosis not present

## 2015-09-30 DIAGNOSIS — Z7982 Long term (current) use of aspirin: Secondary | ICD-10-CM | POA: Insufficient documentation

## 2015-09-30 DIAGNOSIS — G459 Transient cerebral ischemic attack, unspecified: Secondary | ICD-10-CM | POA: Insufficient documentation

## 2015-09-30 DIAGNOSIS — I10 Essential (primary) hypertension: Secondary | ICD-10-CM | POA: Diagnosis not present

## 2015-09-30 DIAGNOSIS — Z7902 Long term (current) use of antithrombotics/antiplatelets: Secondary | ICD-10-CM | POA: Diagnosis not present

## 2015-09-30 DIAGNOSIS — F17210 Nicotine dependence, cigarettes, uncomplicated: Secondary | ICD-10-CM | POA: Insufficient documentation

## 2015-09-30 DIAGNOSIS — Z7984 Long term (current) use of oral hypoglycemic drugs: Secondary | ICD-10-CM | POA: Diagnosis not present

## 2015-09-30 DIAGNOSIS — M6281 Muscle weakness (generalized): Secondary | ICD-10-CM | POA: Diagnosis present

## 2015-09-30 LAB — CBC WITH DIFFERENTIAL/PLATELET
BASOS ABS: 0 10*3/uL (ref 0–0.1)
BASOS PCT: 1 %
EOS PCT: 3 %
Eosinophils Absolute: 0.2 10*3/uL (ref 0–0.7)
HCT: 34.1 % — ABNORMAL LOW (ref 35.0–47.0)
Hemoglobin: 11.5 g/dL — ABNORMAL LOW (ref 12.0–16.0)
Lymphocytes Relative: 40 %
Lymphs Abs: 3.1 10*3/uL (ref 1.0–3.6)
MCH: 30.8 pg (ref 26.0–34.0)
MCHC: 33.8 g/dL (ref 32.0–36.0)
MCV: 91.3 fL (ref 80.0–100.0)
MONO ABS: 0.5 10*3/uL (ref 0.2–0.9)
Monocytes Relative: 7 %
Neutro Abs: 3.9 10*3/uL (ref 1.4–6.5)
Neutrophils Relative %: 49 %
PLATELETS: 263 10*3/uL (ref 150–440)
RBC: 3.73 MIL/uL — AB (ref 3.80–5.20)
RDW: 15.4 % — AB (ref 11.5–14.5)
WBC: 7.8 10*3/uL (ref 3.6–11.0)

## 2015-09-30 LAB — COMPREHENSIVE METABOLIC PANEL
ALBUMIN: 3.8 g/dL (ref 3.5–5.0)
ALT: 18 U/L (ref 14–54)
AST: 26 U/L (ref 15–41)
Alkaline Phosphatase: 92 U/L (ref 38–126)
Anion gap: 5 (ref 5–15)
BUN: 11 mg/dL (ref 6–20)
CHLORIDE: 105 mmol/L (ref 101–111)
CO2: 29 mmol/L (ref 22–32)
Calcium: 9 mg/dL (ref 8.9–10.3)
Creatinine, Ser: 0.79 mg/dL (ref 0.44–1.00)
GFR calc Af Amer: >60 mL/min (ref >60)
GFR calc non Af Amer: >60 mL/min (ref >60)
GLUCOSE: 101 mg/dL — AB (ref 65–99)
POTASSIUM: 3.2 mmol/L — AB (ref 3.5–5.1)
SODIUM: 139 mmol/L (ref 135–145)
Total Bilirubin: 0.4 mg/dL (ref 0.3–1.2)
Total Protein: 7.6 g/dL (ref 6.5–8.1)

## 2015-09-30 MED ORDER — OXYCODONE HCL 5 MG PO TABS: 10.0000 mg | ORAL_TABLET | Freq: Once | ORAL | Status: DC

## 2015-09-30 NOTE — ED Notes (Signed)
Pt states that she changed her mind and wants to leave AMA. EDP aware and talked to pt

## 2015-09-30 NOTE — Discharge Instructions (Signed)
I'm worried that you may have had a TIA or "mini stroke" today however you are wanting to leave [place]. If you change your mind at any time, please return immediately to the emergency department and we are happy to care for you. Return immediately if you develop severe or worsening symptoms, numbness, weakness, severe headache, vision change, chest pain, difficulty breathing, fever, or for any other concerns.

## 2015-09-30 NOTE — ED Notes (Addendum)
States at 1430 this afternoon patient unable to move right arm.  Able to move all other extremities.  STates about one hour ago able to move right arm.  Now complaining of numbness to right arm.  Also c/o left foot pain due to TV dropoing on foot 2 weeks ago and also left arm decrease movement due to an injured bone. X 3 months.   AAOx3.  Skin warm and dry. MAE equally and strong.  NAD>

## 2015-09-30 NOTE — ED Provider Notes (Signed)
[facility] Emergency Department Provider Note  ____________________________________________  Time seen: Approximately 6:53 PM  I have reviewed the triage vital signs and the nursing notes.   HISTORY  Chief Complaint Extremity Weakness    HPI Tammy Armstrong is a 65 y.o. female history of previous CVA with carotid stenosis treated surgically, history of pontine hemorrhage, anxiety, hypertension, diabetes, hyperlipidemia, COPD who presents for evaluation of transient right arm numbness and weakness today which began at 1:30 PM and resolved on arrival to the emergency Department, gradual onset, initially was severe, now resolved. She was concerned that she might of been having another stroke. When she arrived to the ER, her right arm weakness resolved. Additionally, she is complaining of left foot pain that began rectally 2 weeks ago when she dropped a TV on her foot. She denies any numbness or weakness in any other extremity. No chest pain or difficulty breathing. No vomiting, fevers or chills.   Past Medical History  Diagnosis Date  . Migraines   . Hypertension   . Hepatitis C   . Anxiety   . Stroke ([place])   . Seizure ([place]) 07/02/2011  . 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 Respiratory failure ([place]) 07/04/2011  . COPD (chronic obstructive pulmonary disease) ([place]) 07/04/2011  . Hypercholesterolemia   . Diabetes mellitus   . Diabetes mellitus 07/04/2011  . Depression   . Carotid artery occlusion   . Shortness of breath dyspnea   . Cigarette smoker one half pack a day or less   . GERD (gastroesophageal reflux disease)   . Arthritis   . Pancreatitis     Patient Active Problem List   Diagnosis Date Noted  . Carotid stenosis 04/08/2015  . Migraine 04/06/2015  . Dementia with behavioral disturbance 01/19/2015  . Diabetes mellitus 07/04/2011  . Anxiety 07/04/2011  . COPD (chronic obstructive pulmonary disease) ([place]) 07/04/2011  . Purulent bronchitis  ([place]) 07/04/2011  . 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 Abdominal hysterectomy    . Cholecystectomy    . Back surgery  2012    cyst removed from spine , [place]  . Appendectomy    . Endarterectomy Left 05/18/2015    Procedure: ENDARTERECTOMY CAROTID;  Surgeon: Algernon Huxley, MD;  Location: [facility] ORS;  Service: Vascular;  Laterality: Left;    Current Outpatient Rx  Name  Route  Sig  Dispense  Refill  . albuterol (PROVENTIL HFA;VENTOLIN HFA) 108 (90 BASE) MCG/ACT inhaler   Inhalation   Inhale 2 puffs into the lungs every 6 (six) hours as needed for wheezing or shortness of breath.          Marland Kitchen aspirin EC 81 MG EC tablet   Oral   Take 1 tablet (81 mg total) by mouth daily.   90 tablet   3   . atorvastatin (LIPITOR) 40 MG tablet   Oral   Take 1 tablet (40 mg total) by mouth daily at 6 PM.   30 tablet   2   . BREO ELLIPTA 100-25 MCG/INH AEPB   Inhalation   Inhale 1 puff into the lungs daily.           Dispense as written.   . budesonide-formoterol (SYMBICORT) 80-4.5 MCG/ACT inhaler   Inhalation   Inhale 2 puffs into the lungs 2 (two) times daily.   1 Inhaler   2   . busPIRone (BUSPAR) 10 MG tablet   Oral   Take  10 mg by mouth 2 (two) times daily.         . clonazePAM (KLONOPIN) 1 MG tablet   Oral   Take 1 mg by mouth 3 (three) times daily.          . clopidogrel (PLAVIX) 75 MG tablet   Oral   Take 1 tablet (75 mg total) by mouth daily with breakfast.   30 tablet   3   . docusate sodium (COLACE) 100 MG capsule   Oral   Take 100 mg by mouth daily.          . DULoxetine (CYMBALTA) 60 MG capsule   Oral   Take 60 mg by mouth 2 (two) times daily.         Marland Kitchen gabapentin (NEURONTIN) 300 MG capsule   Oral   Take 300 mg by mouth 4 (four) times daily.         Marland Kitchen glipiZIDE (GLUCOTROL XL) 5 MG 24 hr tablet   Oral   Take 5 mg by mouth daily.         Marland Kitchen ibuprofen (ADVIL,MOTRIN) 200 MG tablet    Oral   Take 200 mg by mouth every 6 (six) hours as needed for headache or moderate pain.         Marland Kitchen lisinopril (PRINIVIL,ZESTRIL) 20 MG tablet   Oral   Take 20 mg by mouth daily.         . metFORMIN (GLUCOPHAGE) 500 MG tablet   Oral   Take 500 mg by mouth 2 (two) times daily with a meal.          . metoprolol (TOPROL-XL) 50 MG 24 hr tablet   Oral   Take 50 mg by mouth daily.           Marland Kitchen omeprazole (PRILOSEC) 20 MG capsule   Oral   Take 20 mg by mouth 2 (two) times daily.          . ondansetron (ZOFRAN) 4 MG tablet   Oral   Take 1 tablet (4 mg total) by mouth daily as needed for nausea or vomiting.   20 tablet   1   . oxyCODONE-acetaminophen (PERCOCET) 5-325 MG tablet   Oral   Take 2 tablets by mouth every 6 (six) hours as needed for moderate pain or severe pain.   30 tablet   0   . oxyCODONE-acetaminophen (PERCOCET/ROXICET) 5-325 MG tablet   Oral   Take 1 tablet by mouth every 4 (four) hours as needed for severe pain.   20 tablet   0   . traZODone (DESYREL) 100 MG tablet   Oral   Take 100 mg by mouth at bedtime.            Allergies Promethazine hcl; Ciprofloxacin; Doxycycline; Imitrex; Morphine and related; Penicillins; Zantac; and Ketorolac tromethamine  Family History  Problem Relation Age of Onset  . Hypertension Mother   . Heart attack Mother   . Diabetes Mother   . Diabetes Father   . Diabetes Brother   . Anxiety disorder Mother   . Stroke Mother   . Heart attack Mother   . Alcohol abuse      siblings  . Hypertension Brother     Social History Social History  Substance Use Topics  . Smoking status: Current Every Day Smoker -- 1.00 packs/day    Types: Cigarettes  . Smokeless tobacco: Never Used  . Alcohol Use: No    Review of Systems Constitutional: No  fever/chills Eyes: No visual changes. ENT: No sore throat. Cardiovascular: Denies chest pain. Respiratory: Denies shortness of breath. Gastrointestinal: No abdominal pain.  No  nausea, no vomiting.  No diarrhea.  No constipation. Genitourinary: Negative for dysuria. Musculoskeletal: Negative for back pain. Skin: Negative for rash. Neurological: Negative for headaches, + focal weakness and numbness.  10-point ROS otherwise negative.  ____________________________________________   PHYSICAL EXAM:  VITAL SIGNS: ED Triage Vitals  Enc Vitals Group     BP 09/30/15 1712 164/52 mmHg     Pulse Rate 09/30/15 1712 60     Resp 09/30/15 1712 18     Temp 09/30/15 1712 98.2 F (36.8 C)     Temp Source 09/30/15 1712 Oral     SpO2 09/30/15 1712 99 %     Weight 09/30/15 1712 150 lb (68.04 kg)     Height 09/30/15 1712 5\' 2"  (1.575 m)     Head Cir --      Peak Flow --      Pain Score 09/30/15 1712 7     Pain Loc --      Pain Edu? --      Excl. in [place]? --     Constitutional: Alert and oriented. Well appearing and in no acute distress. Eyes: Conjunctivae are normal. PERRL. EOMI. Head: Atraumatic. Nose: No congestion/rhinnorhea. Mouth/Throat: Mucous membranes are moist.  Oropharynx non-erythematous. Neck: No stridor. Supple without meningismus. Cardiovascular: Normal rate, regular rhythm. Grossly normal heart sounds.  Good peripheral circulation. Respiratory: Normal respiratory effort.  No retractions. Lungs CTAB. Gastrointestinal: Soft and nontender. No distention. No abdominal bruits. No CVA tenderness. Genitourinary: deferred Musculoskeletal: No lower extremity tenderness nor edema.  No joint effusions. Neurologic:  Normal speech and language. No gross focal neurologic deficits are appreciated. No gait instability. 5 out of 5 strength in bilateral upper and lower extremities, sensation intact to light touch throughout, cranial nerves II through XII intact, normal finger-nose-finger. Skin:  Skin is warm, dry and intact. No rash noted.  Psychiatric: Mood and affect are normal. Speech and behavior are normal.  ____________________________________________    LABS (all labs ordered are listed, but only abnormal results are displayed)  Labs Reviewed  CBC WITH DIFFERENTIAL/PLATELET - Abnormal; Notable for the following:    RBC 3.73 (*)    Hemoglobin 11.5 (*)    HCT 34.1 (*)    RDW 15.4 (*)    All other components within normal limits  COMPREHENSIVE METABOLIC PANEL - Abnormal; Notable for the following:    Potassium 3.2 (*)    Glucose, Bld 101 (*)    All other components within normal limits   ____________________________________________  EKG  None - Patient refused ____________________________________________  RADIOLOGY  None - patient refused ____________________________________________   PROCEDURES  Procedure(s) performed: None  Critical Care performed: No  ____________________________________________   INITIAL IMPRESSION / ASSESSMENT AND PLAN / ED COURSE  Pertinent labs & imaging results that were available during my care of the patient were reviewed by me and considered in my medical decision making (see chart for details).  DAMARIE MACHORRO is a 65 y.o. female history of previous CVA with carotid stenosis treated surgically, history of pontine hemorrhage, anxiety, hypertension, diabetes, hyperlipidemia, COPD who presents for evaluation of transient right arm numbness and weakness today which began at 1:30 PM and is now resolved. On exam, she is well-appearing and in no acute distress. Vital signs are stable, she is afebrile. She has an intact/nonfocal neurological examination and her NIH stroke scale is  0. She does have a small wound on the left foot covered with a scab with associated tenderness related to a TV falling on it. 2+ left DP pulse and she wiggles the toes. I discussed with her that I'm concerned that she might have had a TIA today and I recommended admission, CT scan of her head, additional lab work, chest x-ray and have also offered an x-ray of her left foot. She initially agreed to this but then reported to me  that she does not want to stay in the emergency department any longer and she is now refusing any additional testing. She reports that she has a dog at home that "means more to me to anything in this world and I need to get back to him". Discussed with her that she is leaving [place] and I attempted to encourage her to stay at minimum for CT scan of her head which she has again refused because she wants to leave immediately. I discussed with her the risks and benefits of leaving [place]. Discussed with her that TIA could put her at risk for repeat stroke, significant neurological disability, loss of current lifestyle and even untimely death. She voices understanding of this, is able to repeat back to me "I understand I can have a stroke and die but I need to leave". She has signed AMA paperwork. ____________________________________________   FINAL CLINICAL IMPRESSION(S) / ED DIAGNOSES  Final diagnoses:  Transient cerebral ischemia, unspecified transient cerebral ischemia type  Foot pain, left       Joanne Gavel, MD 09/30/15 2351

## 2015-11-09 ENCOUNTER — Ambulatory Visit: Payer: Medicare Other | Admitting: Internal Medicine

## 2015-11-11 ENCOUNTER — Ambulatory Visit: Payer: Medicare Other | Admitting: Surgery

## 2015-11-15 ENCOUNTER — Ambulatory Visit: Payer: Medicare Other | Admitting: Internal Medicine

## 2015-11-17 ENCOUNTER — Ambulatory Visit: Payer: Medicare Other | Admitting: Surgery

## 2016-04-02 ENCOUNTER — Emergency Department
Admission: EM | Admit: 2016-04-02 | Discharge: 2016-04-02 | Disposition: A | Discharge status: Home / Self Care | Payer: Medicare Other | Attending: Emergency Medicine | Admitting: Emergency Medicine

## 2016-04-02 DIAGNOSIS — Z79899 Other long term (current) drug therapy: Secondary | ICD-10-CM | POA: Diagnosis not present

## 2016-04-02 DIAGNOSIS — F17210 Nicotine dependence, cigarettes, uncomplicated: Secondary | ICD-10-CM | POA: Insufficient documentation

## 2016-04-02 DIAGNOSIS — E119 Type 2 diabetes mellitus without complications: Secondary | ICD-10-CM | POA: Insufficient documentation

## 2016-04-02 DIAGNOSIS — F13230 Sedative, hypnotic or anxiolytic dependence with withdrawal, uncomplicated: Secondary | ICD-10-CM | POA: Diagnosis not present

## 2016-04-02 DIAGNOSIS — F13930 Sedative, hypnotic or anxiolytic use, unspecified with withdrawal, uncomplicated: Secondary | ICD-10-CM

## 2016-04-02 DIAGNOSIS — I10 Essential (primary) hypertension: Secondary | ICD-10-CM | POA: Diagnosis not present

## 2016-04-02 DIAGNOSIS — J449 Chronic obstructive pulmonary disease, unspecified: Secondary | ICD-10-CM | POA: Diagnosis not present

## 2016-04-02 DIAGNOSIS — Z791 Long term (current) use of non-steroidal anti-inflammatories (NSAID): Secondary | ICD-10-CM | POA: Diagnosis not present

## 2016-04-02 DIAGNOSIS — R51 Headache: Secondary | ICD-10-CM | POA: Diagnosis present

## 2016-04-02 DIAGNOSIS — Z7982 Long term (current) use of aspirin: Secondary | ICD-10-CM | POA: Insufficient documentation

## 2016-04-02 LAB — BASIC METABOLIC PANEL
Anion gap: 10 (ref 5–15)
BUN: 11 mg/dL (ref 6–20)
CHLORIDE: 101 mmol/L (ref 101–111)
CO2: 27 mmol/L (ref 22–32)
CREATININE: 0.94 mg/dL (ref 0.44–1.00)
Calcium: 9.5 mg/dL (ref 8.9–10.3)
GFR calc non Af Amer: >60 mL/min (ref >60)
Glucose, Bld: 200 mg/dL — ABNORMAL HIGH (ref 65–99)
POTASSIUM: 3.7 mmol/L (ref 3.5–5.1)
Sodium: 138 mmol/L (ref 135–145)

## 2016-04-02 LAB — CBC
HEMATOCRIT: 37.9 % (ref 35.0–47.0)
Hemoglobin: 12.7 g/dL (ref 12.0–16.0)
MCH: 28.5 pg (ref 26.0–34.0)
MCHC: 33.6 g/dL (ref 32.0–36.0)
MCV: 84.8 fL (ref 80.0–100.0)
PLATELETS: 253 10*3/uL (ref 150–440)
RBC: 4.46 MIL/uL (ref 3.80–5.20)
RDW: 17.2 % — ABNORMAL HIGH (ref 11.5–14.5)
WBC: 9.4 10*3/uL (ref 3.6–11.0)

## 2016-04-02 LAB — TROPONIN I: Troponin I: <0.03 ng/mL (ref <0.03)

## 2016-04-02 MED ORDER — CLOPIDOGREL BISULFATE 75 MG PO TABS: 75.0000 mg | ORAL_TABLET | Freq: Every day | ORAL | 3 refills | Status: DC

## 2016-04-02 MED ORDER — ALPRAZOLAM 0.5 MG PO TABS: ORAL_TABLET | ORAL | 0 refills | Status: DC

## 2016-04-02 MED ORDER — LORAZEPAM 2 MG/ML IJ SOLN
1.0000 mg | Freq: Once | INTRAMUSCULAR | Status: AC
  Administered 2016-04-02: 1 mg via INTRAVENOUS
  Filled 2016-04-02: qty 1

## 2016-04-02 MED ORDER — METFORMIN HCL 500 MG PO TABS: 500.0000 mg | ORAL_TABLET | Freq: Two times a day (BID) | ORAL | 2 refills | Status: DC

## 2016-04-02 MED ORDER — LISINOPRIL 20 MG PO TABS: 20.0000 mg | ORAL_TABLET | Freq: Every day | ORAL | 2 refills | Status: DC

## 2016-04-02 MED ORDER — GLIPIZIDE ER 5 MG PO TB24: 5.0000 mg | ORAL_TABLET | Freq: Every day | ORAL | 2 refills | Status: DC

## 2016-04-02 NOTE — ED Provider Notes (Signed)
[facility] Emergency Department Provider Note        Time seen: ----------------------------------------- 7:30 AM on 04/02/2016 -----------------------------------------    I have reviewed the triage vital signs and the nursing notes.   HISTORY  Chief Complaint Hypertension and Headache    HPI Tammy Armstrong is a 65 y.o. female who presents to ER complaining of headache. Patient has a history of bipolar disorder, diabetes, hypertension and hyperlipidemia. Patient states she's not had Xanax prescription in 3 days. Triage note states 7 days. Patient states she ran out a week early. Patient feels very anxious, has had some stomach upset with nausea. Patient states she wants to come off of Xanax, but did not want to stop abruptly.   Past Medical History:  Diagnosis Date  . Anxiety   . Arthritis   . Carotid artery occlusion   . Cigarette smoker one half pack a day or less   . COPD (chronic obstructive pulmonary disease) ([place]) 07/04/2011  . Depression   . Diabetes mellitus   . Diabetes mellitus 07/04/2011  . GERD (gastroesophageal reflux disease)   . Hepatitis C   . Hypercholesterolemia   . Hypertension   . Migraines   . Pancreatitis   . 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 Respiratory failure ([place]) 07/04/2011  . Seizure ([place]) 07/02/2011  . Shortness of breath dyspnea   . Stroke [facility])     Patient Active Problem List   Diagnosis Date Noted  . Carotid stenosis 04/08/2015  . Migraine 04/06/2015  . Dementia with behavioral disturbance 01/19/2015  . Diabetes mellitus 07/04/2011  . Anxiety 07/04/2011  . COPD (chronic obstructive pulmonary disease) ([place]) 07/04/2011  . Purulent bronchitis ([place]) 07/04/2011  . 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 ABDOMINAL HYSTERECTOMY    . APPENDECTOMY    . BACK SURGERY  2012   cyst removed from spine , [place]  . CHOLECYSTECTOMY    . ENDARTERECTOMY  Left 05/18/2015   Procedure: ENDARTERECTOMY CAROTID;  Surgeon: Algernon Huxley, MD;  Location: [facility] ORS;  Service: Vascular;  Laterality: Left;    Allergies Promethazine hcl; Ciprofloxacin; Doxycycline; Imitrex [sumatriptan base]; Morphine and related; Penicillins; Zantac [ranitidine hcl]; and Ketorolac tromethamine  Social History Social History  Substance Use Topics  . Smoking status: Current Every Day Smoker    Packs/day: 1.00    Types: Cigarettes  . Smokeless tobacco: Never Used  . Alcohol use No    Review of Systems Constitutional: Negative for fever. Cardiovascular: Negative for chest pain. Respiratory: Negative for shortness of breath. Gastrointestinal: Negative for abdominal pain, positive for nausea Genitourinary: Negative for dysuria. Musculoskeletal: Negative for back pain. Skin: Negative for rash. Neurological: Negative for headaches, focal weakness or numbness. Psychiatric: Positive for anxiety  10-point ROS otherwise negative.  ____________________________________________   PHYSICAL EXAM:  VITAL SIGNS: ED Triage Vitals  Enc Vitals Group     BP 04/02/16 0635 (!) 227/71     Pulse Rate 04/02/16 0635 81     Resp 04/02/16 0635 20     Temp --      Temp src --      SpO2 04/02/16 0635 96 %     Weight 04/02/16 0635 164 lb 14.4 oz (74.8 kg)     Height 04/02/16 0635 5\' 2"  (1.575 m)     Head Circumference --      Peak Flow --      Pain Score 04/02/16 0636 7  Pain Loc --      Pain Edu? --      Excl. in [place]? --     Constitutional: Alert and oriented. Well appearing and in no distress. Eyes: Conjunctivae are normal. PERRL. Normal extraocular movements. ENT   Head: Normocephalic and atraumatic.   Nose: No congestion/rhinnorhea.   Mouth/Throat: Mucous membranes are moist.   Neck: No stridor. Cardiovascular: Normal rate, regular rhythm. No murmurs, rubs, or gallops. Respiratory: Normal respiratory effort without tachypnea nor retractions. Breath  sounds are clear and equal bilaterally. No wheezes/rales/rhonchi. Gastrointestinal: Soft and nontender. Normal bowel sounds Musculoskeletal: Nontender with normal range of motion in all extremities. No lower extremity tenderness nor edema. Neurologic:  Normal speech and language. No gross focal neurologic deficits are appreciated.  Skin:  Skin is warm, dry and intact. No rash noted. Psychiatric: Mood and affect are normal. Speech and behavior are normal.  ____________________________________________  EKG: Interpreted by me.Sinus rhythm rate 80 bpm, normal PR interval, normal QRS, normal QT interval. Normal axis.  ____________________________________________  ED COURSE:  Pertinent labs & imaging results that were available during my care of the patient were reviewed by me and considered in my medical decision making (see chart for details). Clinical Course  Patient is in no distress, I will assess with basic labs, she may require a dose of IV benzodiazepine.   Procedures ____________________________________________   LABS (pertinent positives/negatives)  Labs Reviewed  BASIC METABOLIC PANEL - Abnormal; Notable for the following:       Result Value   Glucose, Bld 200 (*)    All other components within normal limits  CBC - Abnormal; Notable for the following:    RDW 17.2 (*)    All other components within normal limits  TROPONIN I   _________________________________________  Benzodiazepine withdrawal   Patient with labs as dictated above. Patient's in no distress, blood pressure is almost completely normalized with treatment for anxiety and benzodiazepine withdrawal. I will place her on a Xanax taper which she has requested so that she can stop using benzodiazepines. She is stable for outpatient follow-up with her doctor.   Earleen Newport, MD   Note: This dictation was prepared with Dragon dictation. Any transcriptional errors that result from this process are  unintentional    Earleen Newport, MD 04/02/16 [phone]

## 2016-04-02 NOTE — ED Triage Notes (Signed)
Per EMS: Pt c/o headache. Pt has hx of Bipolar, diabetes, hypertension, and hyperlipidemia. Pt has not taken Xanax prescription in 7 days. Pt has not taken any of her other medications for the past 4 days.

## 2016-04-02 NOTE — ED Notes (Signed)
Pt states she had immediate relief with the ativan.. MD notified.Marland Kitchen

## 2016-04-14 ENCOUNTER — Emergency Department
Admission: EM | Admit: 2016-04-14 | Discharge: 2016-04-14 | Disposition: A | Discharge status: Home / Self Care | Payer: Medicare Other | Attending: Emergency Medicine | Admitting: Emergency Medicine

## 2016-04-14 DIAGNOSIS — F17210 Nicotine dependence, cigarettes, uncomplicated: Secondary | ICD-10-CM | POA: Diagnosis not present

## 2016-04-14 DIAGNOSIS — Z7984 Long term (current) use of oral hypoglycemic drugs: Secondary | ICD-10-CM | POA: Diagnosis not present

## 2016-04-14 DIAGNOSIS — J449 Chronic obstructive pulmonary disease, unspecified: Secondary | ICD-10-CM | POA: Insufficient documentation

## 2016-04-14 DIAGNOSIS — F329 Major depressive disorder, single episode, unspecified: Secondary | ICD-10-CM

## 2016-04-14 DIAGNOSIS — Z8673 Personal history of transient ischemic attack (TIA), and cerebral infarction without residual deficits: Secondary | ICD-10-CM | POA: Insufficient documentation

## 2016-04-14 DIAGNOSIS — E119 Type 2 diabetes mellitus without complications: Secondary | ICD-10-CM | POA: Insufficient documentation

## 2016-04-14 DIAGNOSIS — F13930 Sedative, hypnotic or anxiolytic use, unspecified with withdrawal, uncomplicated: Secondary | ICD-10-CM | POA: Diagnosis not present

## 2016-04-14 DIAGNOSIS — F13230 Sedative, hypnotic or anxiolytic dependence with withdrawal, uncomplicated: Secondary | ICD-10-CM

## 2016-04-14 DIAGNOSIS — Z7982 Long term (current) use of aspirin: Secondary | ICD-10-CM | POA: Diagnosis not present

## 2016-04-14 DIAGNOSIS — Z79899 Other long term (current) drug therapy: Secondary | ICD-10-CM | POA: Diagnosis not present

## 2016-04-14 DIAGNOSIS — F32A Depression, unspecified: Secondary | ICD-10-CM

## 2016-04-14 DIAGNOSIS — F341 Dysthymic disorder: Secondary | ICD-10-CM | POA: Insufficient documentation

## 2016-04-14 DIAGNOSIS — F419 Anxiety disorder, unspecified: Secondary | ICD-10-CM

## 2016-04-14 DIAGNOSIS — I10 Essential (primary) hypertension: Secondary | ICD-10-CM | POA: Diagnosis not present

## 2016-04-14 DIAGNOSIS — R197 Diarrhea, unspecified: Secondary | ICD-10-CM | POA: Diagnosis present

## 2016-04-14 LAB — COMPREHENSIVE METABOLIC PANEL
ALBUMIN: 4.1 g/dL (ref 3.5–5.0)
ALK PHOS: 63 U/L (ref 38–126)
ALT: 20 U/L (ref 14–54)
AST: 23 U/L (ref 15–41)
Anion gap: 8 (ref 5–15)
BILIRUBIN TOTAL: 0.4 mg/dL (ref 0.3–1.2)
BUN: 9 mg/dL (ref 6–20)
CO2: 25 mmol/L (ref 22–32)
Calcium: 9 mg/dL (ref 8.9–10.3)
Chloride: 105 mmol/L (ref 101–111)
Creatinine, Ser: 0.87 mg/dL (ref 0.44–1.00)
GFR calc Af Amer: >60 mL/min (ref >60)
GFR calc non Af Amer: >60 mL/min (ref >60)
GLUCOSE: 151 mg/dL — AB (ref 65–99)
POTASSIUM: 3.6 mmol/L (ref 3.5–5.1)
Sodium: 138 mmol/L (ref 135–145)
TOTAL PROTEIN: 7.6 g/dL (ref 6.5–8.1)

## 2016-04-14 LAB — URINALYSIS COMPLETE WITH MICROSCOPIC (ARMC ONLY)
Bilirubin Urine: NEGATIVE
GLUCOSE, UA: NEGATIVE mg/dL
Hgb urine dipstick: NEGATIVE
Ketones, ur: NEGATIVE mg/dL
Leukocytes, UA: NEGATIVE
NITRITE: NEGATIVE
Protein, ur: NEGATIVE mg/dL
SPECIFIC GRAVITY, URINE: 1.013 (ref 1.005–1.030)
pH: 7 (ref 5.0–8.0)

## 2016-04-14 LAB — CBC
HEMATOCRIT: 36 % (ref 35.0–47.0)
Hemoglobin: 12 g/dL (ref 12.0–16.0)
MCH: 28.7 pg (ref 26.0–34.0)
MCHC: 33.4 g/dL (ref 32.0–36.0)
MCV: 85.8 fL (ref 80.0–100.0)
Platelets: 304 10*3/uL (ref 150–440)
RBC: 4.19 MIL/uL (ref 3.80–5.20)
RDW: 17 % — AB (ref 11.5–14.5)
WBC: 8.8 10*3/uL (ref 3.6–11.0)

## 2016-04-14 LAB — LIPASE, BLOOD: Lipase: 25 U/L (ref 11–51)

## 2016-04-14 MED ORDER — LORAZEPAM 0.5 MG PO TABS: ORAL_TABLET | ORAL | 0 refills | Status: AC

## 2016-04-14 MED ORDER — ONDANSETRON 4 MG PO TBDP
4.0000 mg | ORAL_TABLET | Freq: Once | ORAL | Status: AC
  Administered 2016-04-14: 4 mg via ORAL
  Filled 2016-04-14: qty 1

## 2016-04-14 MED ORDER — LORAZEPAM 1 MG PO TABS
1.0000 mg | ORAL_TABLET | Freq: Once | ORAL | Status: AC
  Administered 2016-04-14: 1 mg via ORAL
  Filled 2016-04-14: qty 1

## 2016-04-14 NOTE — ED Notes (Signed)
Pt reports that she has been having anxiety since being taken off her xanax. Reports that she is worried that someone is going to break into her apartment and attack her or take her things. Also reports that she has been seeing shadows. Reports nausea, abd pain and diarrhea over the last several days.

## 2016-04-14 NOTE — ED Provider Notes (Signed)
[facility] Emergency Department Provider Note ____________________________________________   I have reviewed the triage vital signs and the triage nursing note.  HISTORY  Chief Complaint Withdrawal (poss xanax withdrawal) and Diarrhea   Historian Patient  HPI Tammy Armstrong is a 65 y.o. female with a history of anxiety and depression for which she follows with Dr. Rosine Door, psychiatrist, and she also has a therapist, is presenting here for worsening anxiety and depression. She feels like her skin is hot and her insides are cold. She states that she has a nervous gut and that she's been having some diarrhea. She gets waves of hot flashes with anxiety. She had previously been on what sounds like long-term Xanax for anxiety, but her doctor apparently has been trying to wean her off of this. She is a little unclear on how she has been taking the benzodiazepine. She told me that she has been off now for one week. Although her medication box indicated prescription for Xanax taper, and prior ED physician note from 04/02/16 indicated prescription for Xanax taper, the patient states that she thinks she was just taking 3 per day of the 0.5 mg which was a decrease from 1 mg 3 times a day. In any case it sounds like she probably stopped about 7 days ago and since then she has had nausea, racing thoughts, anxiety, diarrhea, and depression. Denies suicidal ideation.    Past Medical History:  Diagnosis Date  . Anxiety   . Arthritis   . Carotid artery occlusion   . Cigarette smoker one half pack a day or less   . COPD (chronic obstructive pulmonary disease) ([place]) 07/04/2011  . Depression   . Diabetes mellitus   . Diabetes mellitus 07/04/2011  . GERD (gastroesophageal reflux disease)   . Hepatitis C   . Hypercholesterolemia   . Hypertension   . Migraines   . Pancreatitis   . 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 Respiratory failure ([place]) 07/04/2011  . Seizure ([place]) 07/02/2011   . Shortness of breath dyspnea   . Stroke [facility])     Patient Active Problem List   Diagnosis Date Noted  . Carotid stenosis 04/08/2015  . Migraine 04/06/2015  . Dementia with behavioral disturbance 01/19/2015  . Diabetes mellitus 07/04/2011  . Anxiety 07/04/2011  . COPD (chronic obstructive pulmonary disease) ([place]) 07/04/2011  . Purulent bronchitis ([place]) 07/04/2011  . 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 ABDOMINAL HYSTERECTOMY    . APPENDECTOMY    . BACK SURGERY  2012   cyst removed from spine , [place]  . CHOLECYSTECTOMY    . ENDARTERECTOMY Left 05/18/2015   Procedure: ENDARTERECTOMY CAROTID;  Surgeon: Algernon Huxley, MD;  Location: [facility] ORS;  Service: Vascular;  Laterality: Left;    Prior to Admission medications   Medication Sig Start Date End Date Taking? Authorizing Provider  albuterol (PROVENTIL HFA;VENTOLIN HFA) 108 (90 BASE) MCG/ACT inhaler Inhale 2 puffs into the lungs every 6 (six) hours as needed for wheezing or shortness of breath.     Historical Provider, MD  ALPRAZolam Duanne Moron) 0.5 MG tablet Take 1 tablet 3 times daily for 4 days, then 1 tablet twice daily for 4 days, then 1 tablet daily for 4 days, then 1 tablet every other day until gone 04/02/16   Earleen Newport, MD  aspirin EC 81 MG EC tablet Take 1 tablet (81 mg total) by mouth daily. 05/19/15   Algernon Huxley,  MD  atorvastatin (LIPITOR) 40 MG tablet Take 1 tablet (40 mg total) by mouth daily at 6 PM. Patient taking differently: Take 40 mg by mouth daily.  04/08/15   Gladstone Lighter, MD  budesonide-formoterol (SYMBICORT) 80-4.5 MCG/ACT inhaler Inhale 2 puffs into the lungs 2 (two) times daily. 04/08/15   Gladstone Lighter, MD  clopidogrel (PLAVIX) 75 MG tablet Take 1 tablet (75 mg total) by mouth daily with breakfast. 04/02/16   Earleen Newport, MD  docusate sodium (COLACE) 100 MG capsule Take 100 mg by mouth daily.     Historical Provider, MD   DULoxetine (CYMBALTA) 60 MG capsule Take 60 mg by mouth 2 (two) times daily.    Historical Provider, MD  fluticasone furoate-vilanterol (BREO ELLIPTA) 100-25 MCG/INH AEPB Inhale 1 puff into the lungs daily.    Historical Provider, MD  gabapentin (NEURONTIN) 300 MG capsule Take 300 mg by mouth 4 (four) times daily.    Historical Provider, MD  glipiZIDE (GLUCOTROL XL) 5 MG 24 hr tablet Take 1 tablet (5 mg total) by mouth daily. 04/02/16   Earleen Newport, MD  ibuprofen (ADVIL,MOTRIN) 200 MG tablet Take 200 mg by mouth every 6 (six) hours as needed for headache or moderate pain.    Historical Provider, MD  lisinopril (PRINIVIL,ZESTRIL) 20 MG tablet Take 1 tablet (20 mg total) by mouth daily. 04/02/16   Earleen Newport, MD  LORazepam (ATIVAN) 0.5 MG tablet Twice per day for 1 week, then once per day for 1 week. 04/14/16 04/28/16  Lisa Roca, MD  meloxicam (MOBIC) 7.5 MG tablet Take 7.5 mg by mouth 2 (two) times daily.    Historical Provider, MD  metFORMIN (GLUCOPHAGE) 500 MG tablet Take 1 tablet (500 mg total) by mouth 2 (two) times daily with a meal. 04/02/16   Earleen Newport, MD  metoprolol (TOPROL-XL) 50 MG 24 hr tablet Take 50 mg by mouth daily.      Historical Provider, MD  ondansetron (ZOFRAN) 4 MG tablet Take 1 tablet (4 mg total) by mouth daily as needed for nausea or vomiting. 08/03/15   Earleen Newport, MD  pantoprazole (PROTONIX) 40 MG tablet Take 40 mg by mouth 2 (two) times daily.    Historical Provider, MD  QUEtiapine (SEROQUEL) 25 MG tablet Take 25 mg by mouth at bedtime.    Historical Provider, MD  traZODone (DESYREL) 50 MG tablet Take 50 mg by mouth at bedtime.    Historical Provider, MD  ziprasidone (GEODON) 40 MG capsule Take 40 mg by mouth at bedtime.    Historical Provider, MD    Allergies  Allergen Reactions  . Promethazine Hcl Other (See Comments)    Reaction:  Impaired speech   . Ciprofloxacin Other (See Comments)    Medication is contraindicated with some of  pts other meds.    . Doxycycline Nausea And Vomiting  . Imitrex [Sumatriptan Base] Other (See Comments)    Pt states that it "puts her out of this world".    . Morphine And Related Nausea And Vomiting  . Penicillins Nausea And Vomiting and Other (See Comments)    Pt is unable to answer additional questions about this medication.    . Zantac [Ranitidine Hcl] Other (See Comments)    Reaction:  Unknown   . Ketorolac Tromethamine Rash    Family History  Problem Relation Age of Onset  . Hypertension Mother   . Heart attack Mother   . Diabetes Mother   . Anxiety disorder Mother   .  Stroke Mother   . Diabetes Father   . Diabetes Brother   . Alcohol abuse      siblings  . Hypertension Brother     Social History Social History  Substance Use Topics  . Smoking status: Current Every Day Smoker    Packs/day: 1.00    Types: Cigarettes  . Smokeless tobacco: Never Used  . Alcohol use No    Review of Systems  Constitutional: Negative for fever. Eyes: Negative for visual changes. ENT: Negative for sore throat. Cardiovascular: Negative for chest pain. Respiratory: Negative for shortness of breath. Gastrointestinal: Positive for nausea and intermittent diarrhea. Genitourinary: Negative for dysuria. Musculoskeletal: Negative for back pain. Skin: Negative for rash. Neurological: Negative for headache. 10 point Review of Systems otherwise negative ____________________________________________   PHYSICAL EXAM:  VITAL SIGNS: ED Triage Vitals  Enc Vitals Group     BP 04/14/16 0700 (!) 206/72     Pulse Rate 04/14/16 0700 74     Resp 04/14/16 0700 13     Temp --      Temp src --      SpO2 04/14/16 0700 100 %     Weight 04/14/16 0646 164 lb (74.4 kg)     Height 04/14/16 0646 5\' 2"  (1.575 m)     Head Circumference --      Peak Flow --      Pain Score --      Pain Loc --      Pain Edu? --      Excl. in [place]? --      Constitutional: Alert and oriented. Well appearing and in no  distress, she does seem quite anxious HEENT   Head: Normocephalic and atraumatic.      Eyes: Conjunctivae are normal. PERRL. Normal extraocular movements.      Ears:         Nose: No congestion/rhinnorhea.   Mouth/Throat: Mucous membranes are moist. No teeth.   Neck: No stridor. Cardiovascular/Chest: Normal rate, regular rhythm.  No murmurs, rubs, or gallops. Respiratory: Normal respiratory effort without tachypnea nor retractions. Breath sounds are clear and equal bilaterally. No wheezes/rales/rhonchi. Gastrointestinal: Soft. No distention, no guarding, no rebound. Nontender.   Genitourinary/rectal:Deferred Musculoskeletal: Nontender with normal range of motion in all extremities. No joint effusions.  No lower extremity tenderness.  No edema. Neurologic:  Normal speech and language. No gross or focal neurologic deficits are appreciated. Skin:  Skin is warm, dry and intact. No rash noted. Psychiatric: anxious.  Slightly poor historian. No active hallucinations, or agitation.  ____________________________________________  LABS (pertinent positives/negatives)  Labs Reviewed  COMPREHENSIVE METABOLIC PANEL - Abnormal; Notable for the following:       Result Value   Glucose, Bld 151 (*)    All other components within normal limits  CBC - Abnormal; Notable for the following:    RDW 17.0 (*)    All other components within normal limits  URINALYSIS COMPLETEWITH MICROSCOPIC ([facility] ONLY) - Abnormal; Notable for the following:    Color, Urine YELLOW (*)    APPearance CLEAR (*)    Bacteria, UA RARE (*)    Squamous Epithelial / LPF 0-5 (*)    All other components within normal limits  LIPASE, BLOOD    ____________________________________________    EKG I, Lisa Roca, MD, the attending physician have personally viewed and interpreted all ECGs.  None ____________________________________________  RADIOLOGY All Xrays were viewed by me. Imaging interpreted by  Radiologist.  None __________________________________________  PROCEDURES  Procedure(s) performed: None  Critical Care performed: None  ____________________________________________   ED COURSE / ASSESSMENT AND PLAN  Pertinent labs & imaging results that were available during my care of the patient were reviewed by me and considered in my medical decision making (see chart for details).   Ms. Perini is here requesting Xanax as she ran out about a week ago although her history does seem to change a bit. She is describing some symptoms but may be related to benzodiazepine withdrawal including nausea and GI discomfort and anxiety. She is also reporting increased/worsening depression but without suicidal ideation. She is voluntary for evaluation at this point time I don't think that she meets any criteria for involuntary commitment.  In terms of the benzodiazepine use, it appears that her psychiatrist has been trying to wean her off of it. She's had at least one emergency department visit recently for symptoms of benzodiazepine withdrawal due to abrupt discontinuation of Xanax, and was provided a taper of Xanax. When I ask her about it she states that she did not use a taper and in fact just stopped about a week ago.  In any case, I did discuss with her that it is not entirely appropriate for her to have her medication managed in the emergency department contrary to her treating physician.  She does have a history of hypertension, and she is hypertensive here. It's unclear to me whether or not this is related to acute withdrawal or underlying hypertension or combination of both. In addition she is very anxious. I am going to give her 1 dose of Ativan here and make a consult with specialist on-call psychiatrist for further recommendations.  There has been no tachycardia, vomiting, seizure, or tremors.   I did go ahead and give the patient 1 mg tablet of Ativan because I do think her symptoms are  mostly related to benzodiazepine withdrawal. She feels much better and actually when I reviewed her medication pop off list, it appears that it is hand scratched off Dr. Jimmye Norman prescription, potentially from the pharmacist, from the prescribed 3 tablets for 4 days followed by 2 tablets for 4 days followed by 1 tablet for 4 days followed by one tablet every other day for 4 days, to simply 8 days total consisting of 4 days of 2 tablets, and 40 days of 1 tablet. Given this, it does appear that she would have completed the last Xanax on Monday.  Again I'm not entirely sure how this change in taper came about. In any case and do think she is still suffering from benzodiazepine withdrawal. She does state that she very much once to be off of Xanax, but she just can't stand withdrawal. Given her improvement with the Ativan, I am going to go ahead and prescribe her a 2 week taper with ativan.  I hope she can get in with her psychiatrist and her primary care physician for more continuity of care management.  She decided that she did not want to stay to voluntarily discuss with the specialist on-call psychiatrist. She wants to get home to her cat. She is feeling better. I think this is reasonable.    CONSULTATIONS:   Specialist on-call psychiatry. Patient / Family / Caregiver informed of clinical course, medical decision-making process, and agree with plan.    ___________________________________________   FINAL CLINICAL IMPRESSION(S) / ED DIAGNOSES   Final diagnoses:  Anxiety  Depression  Essential hypertension  Benzodiazepine withdrawal, uncomplicated ([place])  Note: This dictation was prepared with Dragon dictation. Any transcriptional errors that result from this process are unintentional    Lisa Roca, MD 04/14/16 1308

## 2016-04-14 NOTE — ED Triage Notes (Signed)
Pt reports that she was recently taken off of her xanax by MD for taking more than prescribed.  Pt reports that she feels like she is withdrawing from xanax and that her "guts are falling out".  Pt reports diarrhea for the past day or two with some nausea and some general anxiety.  Pt was switched to buspirone a couple of days ago, but states that it hasn't helped with anxiety.  Pt in NAD at this time.

## 2016-04-14 NOTE — ED Notes (Signed)
callled [place]  Cancelled per Dr. Reita Cliche

## 2016-04-14 NOTE — Discharge Instructions (Signed)
You are being treated for benzodiazepine withdrawal, with Ativan taper.  Return to the emergency department for any worsening condition including uncontrolled nausea and vomiting, confusion altered mental status, dizziness or passing out, seizure, or any worsening mental condition including anxiety or depression especially with any thoughts of wanting to hurt herself or others.

## 2016-04-14 NOTE — ED Notes (Signed)
Called in to [facility] spoke to [place]

## 2016-04-17 DIAGNOSIS — F3160 Bipolar disorder, current episode mixed, unspecified: Secondary | ICD-10-CM | POA: Insufficient documentation

## 2016-04-17 DIAGNOSIS — K219 Gastro-esophageal reflux disease without esophagitis: Secondary | ICD-10-CM | POA: Insufficient documentation

## 2016-04-17 DIAGNOSIS — F418 Other specified anxiety disorders: Secondary | ICD-10-CM | POA: Insufficient documentation

## 2016-05-28 ENCOUNTER — Other Ambulatory Visit (INDEPENDENT_AMBULATORY_CARE_PROVIDER_SITE_OTHER): Payer: Self-pay | Admitting: Vascular Surgery

## 2016-06-27 ENCOUNTER — Observation Stay
Admission: EM | Admit: 2016-06-27 | Discharge: 2016-06-28 | Disposition: A | Discharge status: Home / Self Care | Payer: Medicare Other | Attending: Internal Medicine | Admitting: Internal Medicine

## 2016-06-27 ENCOUNTER — Emergency Department: Payer: Medicare Other

## 2016-06-27 ENCOUNTER — Encounter: Payer: Self-pay | Admitting: Emergency Medicine

## 2016-06-27 DIAGNOSIS — I671 Cerebral aneurysm, nonruptured: Secondary | ICD-10-CM | POA: Diagnosis not present

## 2016-06-27 DIAGNOSIS — Z794 Long term (current) use of insulin: Secondary | ICD-10-CM | POA: Insufficient documentation

## 2016-06-27 DIAGNOSIS — Z7902 Long term (current) use of antithrombotics/antiplatelets: Secondary | ICD-10-CM | POA: Insufficient documentation

## 2016-06-27 DIAGNOSIS — F0391 Unspecified dementia with behavioral disturbance: Secondary | ICD-10-CM | POA: Diagnosis not present

## 2016-06-27 DIAGNOSIS — Z880 Allergy status to penicillin: Secondary | ICD-10-CM | POA: Diagnosis not present

## 2016-06-27 DIAGNOSIS — F419 Anxiety disorder, unspecified: Secondary | ICD-10-CM | POA: Diagnosis not present

## 2016-06-27 DIAGNOSIS — R202 Paresthesia of skin: Secondary | ICD-10-CM | POA: Diagnosis present

## 2016-06-27 DIAGNOSIS — R519 Headache, unspecified: Secondary | ICD-10-CM

## 2016-06-27 DIAGNOSIS — G459 Transient cerebral ischemic attack, unspecified: Principal | ICD-10-CM | POA: Diagnosis present

## 2016-06-27 DIAGNOSIS — F17210 Nicotine dependence, cigarettes, uncomplicated: Secondary | ICD-10-CM | POA: Diagnosis not present

## 2016-06-27 DIAGNOSIS — Z8673 Personal history of transient ischemic attack (TIA), and cerebral infarction without residual deficits: Secondary | ICD-10-CM | POA: Diagnosis not present

## 2016-06-27 DIAGNOSIS — Z885 Allergy status to narcotic agent status: Secondary | ICD-10-CM | POA: Insufficient documentation

## 2016-06-27 DIAGNOSIS — M6281 Muscle weakness (generalized): Secondary | ICD-10-CM

## 2016-06-27 DIAGNOSIS — J449 Chronic obstructive pulmonary disease, unspecified: Secondary | ICD-10-CM | POA: Diagnosis not present

## 2016-06-27 DIAGNOSIS — G43909 Migraine, unspecified, not intractable, without status migrainosus: Secondary | ICD-10-CM | POA: Diagnosis not present

## 2016-06-27 DIAGNOSIS — K219 Gastro-esophageal reflux disease without esophagitis: Secondary | ICD-10-CM | POA: Diagnosis not present

## 2016-06-27 DIAGNOSIS — Z7982 Long term (current) use of aspirin: Secondary | ICD-10-CM | POA: Insufficient documentation

## 2016-06-27 DIAGNOSIS — R51 Headache: Secondary | ICD-10-CM | POA: Diagnosis present

## 2016-06-27 DIAGNOSIS — E119 Type 2 diabetes mellitus without complications: Secondary | ICD-10-CM | POA: Diagnosis not present

## 2016-06-27 DIAGNOSIS — E785 Hyperlipidemia, unspecified: Secondary | ICD-10-CM | POA: Diagnosis not present

## 2016-06-27 DIAGNOSIS — Z8619 Personal history of other infectious and parasitic diseases: Secondary | ICD-10-CM | POA: Diagnosis not present

## 2016-06-27 DIAGNOSIS — R569 Unspecified convulsions: Secondary | ICD-10-CM | POA: Diagnosis not present

## 2016-06-27 DIAGNOSIS — I6521 Occlusion and stenosis of right carotid artery: Secondary | ICD-10-CM | POA: Insufficient documentation

## 2016-06-27 DIAGNOSIS — R200 Anesthesia of skin: Secondary | ICD-10-CM

## 2016-06-27 DIAGNOSIS — D649 Anemia, unspecified: Secondary | ICD-10-CM | POA: Diagnosis not present

## 2016-06-27 DIAGNOSIS — Z888 Allergy status to other drugs, medicaments and biological substances status: Secondary | ICD-10-CM | POA: Insufficient documentation

## 2016-06-27 DIAGNOSIS — M1990 Unspecified osteoarthritis, unspecified site: Secondary | ICD-10-CM | POA: Diagnosis not present

## 2016-06-27 DIAGNOSIS — I10 Essential (primary) hypertension: Secondary | ICD-10-CM | POA: Diagnosis not present

## 2016-06-27 DIAGNOSIS — E7800 Pure hypercholesterolemia, unspecified: Secondary | ICD-10-CM | POA: Diagnosis not present

## 2016-06-27 DIAGNOSIS — F329 Major depressive disorder, single episode, unspecified: Secondary | ICD-10-CM | POA: Diagnosis not present

## 2016-06-27 LAB — COMPREHENSIVE METABOLIC PANEL
ALT: 19 U/L (ref 14–54)
AST: 25 U/L (ref 15–41)
Albumin: 3.9 g/dL (ref 3.5–5.0)
Alkaline Phosphatase: 72 U/L (ref 38–126)
Anion gap: 6 (ref 5–15)
BILIRUBIN TOTAL: 0.2 mg/dL — AB (ref 0.3–1.2)
BUN: 22 mg/dL — ABNORMAL HIGH (ref 6–20)
CHLORIDE: 107 mmol/L (ref 101–111)
CO2: 27 mmol/L (ref 22–32)
Calcium: 9 mg/dL (ref 8.9–10.3)
Creatinine, Ser: 0.83 mg/dL (ref 0.44–1.00)
Glucose, Bld: 156 mg/dL — ABNORMAL HIGH (ref 65–99)
POTASSIUM: 3.8 mmol/L (ref 3.5–5.1)
Sodium: 140 mmol/L (ref 135–145)
TOTAL PROTEIN: 7.4 g/dL (ref 6.5–8.1)

## 2016-06-27 LAB — TROPONIN I

## 2016-06-27 LAB — DIFFERENTIAL
BASOS PCT: 1 %
Basophils Absolute: 0.1 10*3/uL (ref 0–0.1)
EOS ABS: 0.2 10*3/uL (ref 0–0.7)
EOS PCT: 3 %
LYMPHS ABS: 2.9 10*3/uL (ref 1.0–3.6)
Lymphocytes Relative: 43 %
MONO ABS: 0.4 10*3/uL (ref 0.2–0.9)
MONOS PCT: 6 %
Neutro Abs: 3.2 10*3/uL (ref 1.4–6.5)
Neutrophils Relative %: 47 %

## 2016-06-27 LAB — CBC
HEMATOCRIT: 32.5 % — AB (ref 35.0–47.0)
Hemoglobin: 10.8 g/dL — ABNORMAL LOW (ref 12.0–16.0)
MCH: 27.5 pg (ref 26.0–34.0)
MCHC: 33.2 g/dL (ref 32.0–36.0)
MCV: 82.9 fL (ref 80.0–100.0)
Platelets: 229 10*3/uL (ref 150–440)
RBC: 3.92 MIL/uL (ref 3.80–5.20)
RDW: 16.9 % — AB (ref 11.5–14.5)
WBC: 6.7 10*3/uL (ref 3.6–11.0)

## 2016-06-27 LAB — PROTIME-INR
INR: 0.98
Prothrombin Time: 13 seconds (ref 11.4–15.2)

## 2016-06-27 LAB — APTT: APTT: 28 s (ref 24–36)

## 2016-06-27 MED ORDER — IPRATROPIUM-ALBUTEROL 0.5-2.5 (3) MG/3ML IN SOLN: 3.0000 mL | Freq: Once | RESPIRATORY_TRACT | Status: DC

## 2016-06-27 MED ORDER — HYDROCODONE-ACETAMINOPHEN 5-325 MG PO TABS
1.0000 | ORAL_TABLET | Freq: Once | ORAL | Status: AC
  Administered 2016-06-27: 1 via ORAL
  Filled 2016-06-27: qty 1

## 2016-06-27 MED ORDER — ASPIRIN 81 MG PO CHEW
324.0000 mg | CHEWABLE_TABLET | Freq: Once | ORAL | Status: AC
  Administered 2016-06-27: 324 mg via ORAL
  Filled 2016-06-27: qty 4

## 2016-06-27 MED ORDER — HYDRALAZINE HCL 20 MG/ML IJ SOLN
2.0000 mg | Freq: Once | INTRAMUSCULAR | Status: AC
  Administered 2016-06-27: 2 mg via INTRAVENOUS

## 2016-06-27 MED ORDER — HYDRALAZINE HCL 20 MG/ML IJ SOLN
2.0000 mg | Freq: Once | INTRAMUSCULAR | Status: DC
  Filled 2016-06-27: qty 1

## 2016-06-27 NOTE — ED Notes (Signed)
Spoke with Dr. Almyra Free regarding pt's blood pressure. MD stated that it was fine for pt to go to floor with current blood pressure. Order modified to reflect that.

## 2016-06-27 NOTE — ED Notes (Signed)
Dr quale at bedside. 

## 2016-06-27 NOTE — ED Notes (Signed)
Pt ambulated to bathroom and hooked back up to monitor.

## 2016-06-27 NOTE — ED Provider Notes (Signed)
[facility] Emergency Department Provider Note   ____________________________________________   First MD Initiated Contact with Patient 06/27/16 1824     (approximate)  I have reviewed the triage vital signs and the nursing notes.   HISTORY  Chief Complaint arm numbness and Arm Pain    HPI Tammy Armstrong is a 65 y.o. female here for evaluation of tingling over the right side of the face, numbness over the right upper arm and forearm, achy pain in the right forearm. Patient reports this started about 8:30 this morning, has been accompanied with a moderate throbbing headache. She reports that she had very similar symptoms when she had a small possible stroke due to left carotid artery about a year and a half ago.  Nose sudden onset or thunderclap type headache. No nausea or vomiting. No fevers or chills. No neck pain.  No trouble walking, but reports right hand feels slightly weak. That is normal. She does take aspirin and Plavix daily  No chest pain or shortness of breath  Past Medical History:  Diagnosis Date  . Anxiety   . Arthritis   . Carotid artery occlusion   . Cigarette smoker one half pack a day or less   . COPD (chronic obstructive pulmonary disease) ([place]) 07/04/2011  . Depression   . Diabetes mellitus   . Diabetes mellitus 07/04/2011  . GERD (gastroesophageal reflux disease)   . Hepatitis C   . Hypercholesterolemia   . Hypertension   . Migraines   . Pancreatitis   . 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 Respiratory failure ([place]) 07/04/2011  . Seizure ([place]) 07/02/2011  . Shortness of breath dyspnea   . Stroke [facility])     Patient Active Problem List   Diagnosis Date Noted  . Carotid stenosis 04/08/2015  . Migraine 04/06/2015  . Dementia with behavioral disturbance 01/19/2015  . Diabetes mellitus 07/04/2011  . Anxiety 07/04/2011  . COPD (chronic obstructive pulmonary disease) ([place]) 07/04/2011  . Purulent bronchitis ([place])  07/04/2011  . 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 ABDOMINAL HYSTERECTOMY    . APPENDECTOMY    . BACK SURGERY  2012   cyst removed from spine ,   . CHOLECYSTECTOMY    . ENDARTERECTOMY Left 05/18/2015   Procedure: ENDARTERECTOMY CAROTID;  Surgeon: Algernon Huxley, MD;  Location: [facility] ORS;  Service: Vascular;  Laterality: Left;    Prior to Admission medications   Medication Sig Start Date End Date Taking? Authorizing Provider  albuterol (PROVENTIL HFA;VENTOLIN HFA) 108 (90 BASE) MCG/ACT inhaler Inhale 2 puffs into the lungs every 6 (six) hours as needed for wheezing or shortness of breath.     Historical Provider, MD  ALPRAZolam Duanne Moron) 0.5 MG tablet Take 1 tablet 3 times daily for 4 days, then 1 tablet twice daily for 4 days, then 1 tablet daily for 4 days, then 1 tablet every other day until gone 04/02/16   Earleen Newport, MD  aspirin EC 81 MG EC tablet Take 1 tablet (81 mg total) by mouth daily. 05/19/15   Algernon Huxley, MD  atorvastatin (LIPITOR) 40 MG tablet Take 1 tablet (40 mg total) by mouth daily at 6 PM. Patient taking differently: Take 40 mg by mouth daily.  04/08/15   Gladstone Lighter, MD  budesonide-formoterol (SYMBICORT) 80-4.5 MCG/ACT inhaler Inhale 2 puffs into the lungs 2 (two) times daily. 04/08/15   Gladstone Lighter, MD  clopidogrel (PLAVIX) 75 MG tablet  Take 1 tablet (75 mg total) by mouth daily with breakfast. 04/02/16   Earleen Newport, MD  docusate sodium (COLACE) 100 MG capsule Take 100 mg by mouth daily.     Historical Provider, MD  DULoxetine (CYMBALTA) 60 MG capsule Take 60 mg by mouth 2 (two) times daily.    Historical Provider, MD  fluticasone furoate-vilanterol (BREO ELLIPTA) 100-25 MCG/INH AEPB Inhale 1 puff into the lungs daily.    Historical Provider, MD  gabapentin (NEURONTIN) 300 MG capsule Take 300 mg by mouth 4 (four) times daily.    Historical Provider, MD  glipiZIDE (GLUCOTROL XL) 5 MG  24 hr tablet Take 1 tablet (5 mg total) by mouth daily. 04/02/16   Earleen Newport, MD  ibuprofen (ADVIL,MOTRIN) 200 MG tablet Take 200 mg by mouth every 6 (six) hours as needed for headache or moderate pain.    Historical Provider, MD  lisinopril (PRINIVIL,ZESTRIL) 20 MG tablet Take 1 tablet (20 mg total) by mouth daily. 04/02/16   Earleen Newport, MD  meloxicam (MOBIC) 7.5 MG tablet Take 7.5 mg by mouth 2 (two) times daily.    Historical Provider, MD  metFORMIN (GLUCOPHAGE) 500 MG tablet Take 1 tablet (500 mg total) by mouth 2 (two) times daily with a meal. 04/02/16   Earleen Newport, MD  metoprolol (TOPROL-XL) 50 MG 24 hr tablet Take 50 mg by mouth daily.      Historical Provider, MD  ondansetron (ZOFRAN) 4 MG tablet Take 1 tablet (4 mg total) by mouth daily as needed for nausea or vomiting. 08/03/15   Earleen Newport, MD  pantoprazole (PROTONIX) 40 MG tablet Take 40 mg by mouth 2 (two) times daily.    Historical Provider, MD  QUEtiapine (SEROQUEL) 25 MG tablet Take 25 mg by mouth at bedtime.    Historical Provider, MD  traZODone (DESYREL) 50 MG tablet Take 50 mg by mouth at bedtime.    Historical Provider, MD  ziprasidone (GEODON) 40 MG capsule Take 40 mg by mouth at bedtime.    Historical Provider, MD    Allergies Promethazine hcl; Ciprofloxacin; Doxycycline; Imitrex [sumatriptan base]; Morphine and related; Penicillins; Zantac [ranitidine hcl]; and Ketorolac tromethamine  Family History  Problem Relation Age of Onset  . Hypertension Mother   . Heart attack Mother   . Diabetes Mother   . Anxiety disorder Mother   . Stroke Mother   . Diabetes Father   . Diabetes Brother   . Alcohol abuse      siblings  . Hypertension Brother     Social History Social History  Substance Use Topics  . Smoking status: Current Every Day Smoker    Packs/day: 1.00    Types: Cigarettes  . Smokeless tobacco: Never Used  . Alcohol use No    Review of Systems Constitutional: No  fever/chills Eyes: No visual changes. ENT: No sore throat. Cardiovascular: Denies chest pain. Respiratory: Denies shortness of breath. Gastrointestinal: No abdominal pain.  No nausea, no vomiting.  No diarrhea.  No constipation. Genitourinary: Negative for dysuria. Musculoskeletal: Negative for back pain. Skin: Negative for rash. Neurological: See history of present illness. Also reports her speech is a little difficult at times 10-point ROS otherwise negative.  ____________________________________________   PHYSICAL EXAM:  VITAL SIGNS: ED Triage Vitals  Enc Vitals Group     BP 06/27/16 1725 (!) 150/87     Pulse Rate 06/27/16 1725 64     Resp 06/27/16 1725 15     Temp 06/27/16 1725  99 F (37.2 C)     Temp Source 06/27/16 1725 Oral     SpO2 06/27/16 1725 99 %     Weight 06/27/16 1725 168 lb (76.2 kg)     Height 06/27/16 1725 5\' 2"  (1.575 m)     Head Circumference --      Peak Flow --      Pain Score 06/27/16 1726 6     Pain Loc --      Pain Edu? --      Excl. in [place]? --     Constitutional: Alert and oriented. Well appearing and in no acute distress. Eyes: Conjunctivae are normal. PERRL. EOMI. Head: Atraumatic. Nose: No congestion/rhinnorhea. Mouth/Throat: Mucous membranes are moist.  Oropharynx non-erythematous. Neck: No stridor.   No carotid bruits Cardiovascular: Normal rate, regular rhythm. Grossly normal heart sounds.  Good peripheral circulation. Respiratory: Normal respiratory effort.  No retractions. Lungs CTAB. Gastrointestinal: Soft and nontender. No distention. No abdominal bruits. No CVA tenderness. Musculoskeletal: No lower extremity tenderness nor edema.  No joint effusions. Neurologic:  Normal speech and language. No gross focal neurologic deficits are appreciated. No gait instability. Skin:  Skin is warm, dry and intact. No rash noted. Psychiatric: Mood and affect are normal. Speech and behavior are  normal.  ____________________________________________   LABS (all labs ordered are listed, but only abnormal results are displayed)  Labs Reviewed  CBC - Abnormal; Notable for the following:       Result Value   Hemoglobin 10.8 (*)    HCT 32.5 (*)    RDW 16.9 (*)    All other components within normal limits  COMPREHENSIVE METABOLIC PANEL - Abnormal; Notable for the following:    Glucose, Bld 156 (*)    BUN 22 (*)    Total Bilirubin 0.2 (*)    All other components within normal limits  PROTIME-INR  APTT  DIFFERENTIAL  TROPONIN I  CBG MONITORING, ED   ____________________________________________  EKG  Reviewed and to me at 1740 Heart rate 60 QRS 90 QTc 4:30 Normal sinus rhythm, no acute ischemia noted except for a mild T-wave inversion in aVL and I. Minimal change noted from prior. Patient denying any chest pain. Trop wnl. ____________________________________________  RADIOLOGY  Ct Head Wo Contrast  Result Date: 06/27/2016 CLINICAL DATA:  Right arm pain and numbness. EXAM: CT HEAD WITHOUT CONTRAST TECHNIQUE: Contiguous axial images were obtained from the base of the skull through the vertex without intravenous contrast. COMPARISON:  04/29/2015 FINDINGS: Brain: No evidence of acute infarction, hemorrhage, hydrocephalus, extra-axial collection or mass lesion/mass effect. Calcification within the mid pons is again noted. 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 Mild low attenuation within the subcortical and periventricular white matter noted compatible with chronic microvascular disease. No evidence for acute brain infarct, intracranial hemorrhage or mass. Vascular: No hyperdense vessel or unexpected calcification. Skull: Normal. Negative for fracture or focal lesion. Sinuses/Orbits: The paranasal sinuses and mastoid air cells are clear. The calvarium is intact. Other: None. IMPRESSION: 1. No acute intracranial abnormality  identified. 2. Chronic changes as described above. Electronically Signed   By: Kerby Moors M.D.   On: 06/27/2016 17:59    ____________________________________________   PROCEDURES  Procedure(s) performed: None  Procedures  Critical Care performed: No  ____________________________________________   INITIAL IMPRESSION / ASSESSMENT AND PLAN / ED COURSE  Pertinent labs & imaging results that were available during my care of the patient were reviewed by me and considered  in my medical decision making (see chart for details).  Patient presents for acute numbness in the right side of face right arm in slight feeling of weakness on the right. Objectively patient reports numbness right face right arm. Neurovascularly intact except for mild decreased sensation. Neurovascularly intact no evidence of poor perfusion. No acute abnormality noted on 12-lead, troponin normal. Based on the patient's previous history and similar presentation leading up to and endarterectomy I'm concerned she may have had a small ischemic infarct. We'll admit the patient for further workup under the hospitalist service.    Clinical Course      ____________________________________________   FINAL CLINICAL IMPRESSION(S) / ED DIAGNOSES  Final diagnoses:  Numbness and tingling of right side of face  Right arm numbness  Nonintractable episodic headache, unspecified headache type      NEW MEDICATIONS STARTED DURING THIS VISIT:  New Prescriptions   No medications on file     Note:  This document was prepared using Dragon voice recognition software and may include unintentional dictation errors.     Delman Kitten, MD 06/27/16 2053

## 2016-06-27 NOTE — ED Notes (Signed)
Spoke with admitting physician regarding patients BP. Admitting physician stated that she would put and order in for something for patient's BP.

## 2016-06-27 NOTE — ED Triage Notes (Addendum)
Pt with right arm pain and numbness started around 8am. Pt with hx of stroke and reports same symptoms as last time. No deficits from previous stroke. Denies any slurred speech and no facial droop noted. Pt does states that she has a "bad" headache.  Pt taken to CT prior to going to ER Room 16. Primary RN aware.

## 2016-06-28 ENCOUNTER — Observation Stay: Payer: Medicare Other

## 2016-06-28 ENCOUNTER — Observation Stay
Admit: 2016-06-28 | Discharge: 2016-06-28 | Disposition: A | Discharge status: Home / Self Care | Payer: Medicare Other | Attending: Family Medicine | Admitting: Family Medicine

## 2016-06-28 DIAGNOSIS — G459 Transient cerebral ischemic attack, unspecified: Secondary | ICD-10-CM | POA: Diagnosis not present

## 2016-06-28 DIAGNOSIS — R200 Anesthesia of skin: Secondary | ICD-10-CM

## 2016-06-28 DIAGNOSIS — R202 Paresthesia of skin: Secondary | ICD-10-CM

## 2016-06-28 LAB — GLUCOSE, CAPILLARY
GLUCOSE-CAPILLARY: 172 mg/dL — AB (ref 65–99)
GLUCOSE-CAPILLARY: 122 mg/dL — AB (ref 65–99)
GLUCOSE-CAPILLARY: 199 mg/dL — AB (ref 65–99)
Glucose-Capillary: 126 mg/dL — ABNORMAL HIGH (ref 65–99)
Glucose-Capillary: 154 mg/dL — ABNORMAL HIGH (ref 65–99)

## 2016-06-28 LAB — ECHOCARDIOGRAM COMPLETE
HEIGHTINCHES: 62 in
WEIGHTICAEL: 2688 [oz_av]

## 2016-06-28 LAB — URINALYSIS, DIPSTICK ONLY
BILIRUBIN URINE: NEGATIVE
Glucose, UA: NEGATIVE mg/dL
Hgb urine dipstick: NEGATIVE
KETONES UR: NEGATIVE mg/dL
Leukocytes, UA: NEGATIVE
NITRITE: NEGATIVE
PH: 7 (ref 5.0–8.0)
Protein, ur: NEGATIVE mg/dL
Specific Gravity, Urine: 1.006 (ref 1.005–1.030)

## 2016-06-28 LAB — URINE DRUG SCREEN, QUALITATIVE (ARMC ONLY)
AMPHETAMINES, UR SCREEN: NOT DETECTED
Barbiturates, Ur Screen: NOT DETECTED
Benzodiazepine, Ur Scrn: NOT DETECTED
COCAINE METABOLITE, UR ~~LOC~~: NOT DETECTED
Cannabinoid 50 Ng, Ur ~~LOC~~: NOT DETECTED
MDMA (ECSTASY) UR SCREEN: NOT DETECTED
Methadone Scn, Ur: NOT DETECTED
OPIATE, UR SCREEN: NOT DETECTED
PHENCYCLIDINE (PCP) UR S: NOT DETECTED
Tricyclic, Ur Screen: NOT DETECTED

## 2016-06-28 LAB — LIPID PANEL
CHOL/HDL RATIO: 4 ratio
Cholesterol: 131 mg/dL (ref 0–200)
HDL: 33 mg/dL — ABNORMAL LOW (ref >40)
LDL CALC: 64 mg/dL (ref 0–99)
Triglycerides: 169 mg/dL — ABNORMAL HIGH (ref <150)
VLDL: 34 mg/dL (ref 0–40)

## 2016-06-28 LAB — MRSA PCR SCREENING: MRSA by PCR: NEGATIVE

## 2016-06-28 LAB — TROPONIN I
Troponin I: <0.03 ng/mL (ref <0.03)
Troponin I: <0.03 ng/mL (ref <0.03)

## 2016-06-28 MED ORDER — DOCUSATE SODIUM 100 MG PO CAPS
100.0000 mg | ORAL_CAPSULE | Freq: Every day | ORAL | Status: DC
  Administered 2016-06-28: 100 mg via ORAL
  Filled 2016-06-28: qty 1

## 2016-06-28 MED ORDER — ZIPRASIDONE HCL 20 MG PO CAPS
40.0000 mg | ORAL_CAPSULE | Freq: Two times a day (BID) | ORAL | Status: DC
Start: 2016-06-28 — End: 2016-06-28
  Administered 2016-06-28 (×2): 40 mg via ORAL
  Filled 2016-06-28 (×2): qty 2

## 2016-06-28 MED ORDER — TRAZODONE HCL 50 MG PO TABS
50.0000 mg | ORAL_TABLET | Freq: Every day | ORAL | Status: DC
  Administered 2016-06-28: 01:00:00 50 mg via ORAL
  Filled 2016-06-28: qty 1

## 2016-06-28 MED ORDER — HYDRALAZINE HCL 20 MG/ML IJ SOLN
5.0000 mg | Freq: Once | INTRAMUSCULAR | Status: AC
Start: 2016-06-28 — End: 2016-06-28
  Administered 2016-06-28: 5 mg via INTRAVENOUS
  Filled 2016-06-28: qty 1

## 2016-06-28 MED ORDER — GABAPENTIN 300 MG PO CAPS
300.0000 mg | ORAL_CAPSULE | Freq: Four times a day (QID) | ORAL | Status: DC
  Administered 2016-06-28 (×3): 300 mg via ORAL
  Filled 2016-06-28 (×3): qty 1

## 2016-06-28 MED ORDER — DULOXETINE HCL 60 MG PO CPEP
60.0000 mg | ORAL_CAPSULE | Freq: Two times a day (BID) | ORAL | Status: DC
  Administered 2016-06-28: 10:00:00 60 mg via ORAL
  Filled 2016-06-28: qty 1

## 2016-06-28 MED ORDER — PANTOPRAZOLE SODIUM 40 MG PO TBEC
40.0000 mg | DELAYED_RELEASE_TABLET | Freq: Two times a day (BID) | ORAL | Status: DC
  Administered 2016-06-28 (×2): 40 mg via ORAL
  Filled 2016-06-28 (×2): qty 1

## 2016-06-28 MED ORDER — MELOXICAM 7.5 MG PO TABS
7.5000 mg | ORAL_TABLET | Freq: Two times a day (BID) | ORAL | Status: DC
  Administered 2016-06-28: 10:00:00 7.5 mg via ORAL
  Filled 2016-06-28 (×2): qty 1

## 2016-06-28 MED ORDER — CLOPIDOGREL BISULFATE 75 MG PO TABS
75.0000 mg | ORAL_TABLET | Freq: Every day | ORAL | Status: DC
  Administered 2016-06-28: 08:00:00 75 mg via ORAL
  Filled 2016-06-28: qty 1

## 2016-06-28 MED ORDER — QUETIAPINE FUMARATE 25 MG PO TABS
25.0000 mg | ORAL_TABLET | Freq: Every day | ORAL | Status: DC
  Administered 2016-06-28: 25 mg via ORAL
  Filled 2016-06-28: qty 1

## 2016-06-28 MED ORDER — ASPIRIN EC 81 MG PO TBEC
81.0000 mg | DELAYED_RELEASE_TABLET | Freq: Every day | ORAL | Status: DC
  Administered 2016-06-28: 81 mg via ORAL
  Filled 2016-06-28: qty 1

## 2016-06-28 MED ORDER — ACETAMINOPHEN 650 MG RE SUPP: 650.0000 mg | RECTAL | Status: DC | PRN

## 2016-06-28 MED ORDER — GLIPIZIDE ER 5 MG PO TB24
5.0000 mg | ORAL_TABLET | Freq: Every day | ORAL | Status: DC
  Filled 2016-06-28: qty 1

## 2016-06-28 MED ORDER — STROKE: EARLY STAGES OF RECOVERY BOOK
Freq: Once | Status: AC
  Administered 2016-06-28: 01:00:00

## 2016-06-28 MED ORDER — ENOXAPARIN SODIUM 40 MG/0.4ML ~~LOC~~ SOLN
40.0000 mg | Freq: Every day | SUBCUTANEOUS | Status: DC
  Administered 2016-06-28: 40 mg via SUBCUTANEOUS
  Filled 2016-06-28: qty 0.4

## 2016-06-28 MED ORDER — INSULIN ASPART 100 UNIT/ML ~~LOC~~ SOLN: 0.0000 [IU] | Freq: Every day | SUBCUTANEOUS | Status: DC

## 2016-06-28 MED ORDER — LISINOPRIL 10 MG PO TABS
20.0000 mg | ORAL_TABLET | Freq: Every day | ORAL | Status: DC
  Administered 2016-06-28: 14:00:00 20 mg via ORAL
  Filled 2016-06-28: qty 2

## 2016-06-28 MED ORDER — ATORVASTATIN CALCIUM 20 MG PO TABS
40.0000 mg | ORAL_TABLET | Freq: Every day | ORAL | Status: DC
  Administered 2016-06-28: 10:00:00 40 mg via ORAL
  Filled 2016-06-28: qty 2

## 2016-06-28 MED ORDER — MOMETASONE FURO-FORMOTEROL FUM 100-5 MCG/ACT IN AERO: 2.0000 | INHALATION_SPRAY | Freq: Two times a day (BID) | RESPIRATORY_TRACT | Status: DC

## 2016-06-28 MED ORDER — FLUTICASONE FUROATE-VILANTEROL 100-25 MCG/INH IN AEPB
1.0000 | INHALATION_SPRAY | Freq: Every day | RESPIRATORY_TRACT | Status: DC
  Administered 2016-06-28: 10:00:00 1 via RESPIRATORY_TRACT
  Filled 2016-06-28: qty 28

## 2016-06-28 MED ORDER — SODIUM CHLORIDE 0.9 % IV SOLN
INTRAVENOUS | Status: DC
Start: 2016-06-28 — End: 2016-06-28
  Administered 2016-06-28: 01:00:00 via INTRAVENOUS

## 2016-06-28 MED ORDER — INSULIN ASPART 100 UNIT/ML ~~LOC~~ SOLN
0.0000 [IU] | Freq: Three times a day (TID) | SUBCUTANEOUS | Status: DC
  Administered 2016-06-28 (×2): 3 [IU] via SUBCUTANEOUS
  Filled 2016-06-28 (×2): qty 3

## 2016-06-28 MED ORDER — ACETAMINOPHEN 325 MG PO TABS
650.0000 mg | ORAL_TABLET | ORAL | Status: DC | PRN
  Administered 2016-06-28: 07:00:00 650 mg via ORAL
  Filled 2016-06-28: qty 2

## 2016-06-28 MED ORDER — IOPAMIDOL (ISOVUE-370) INJECTION 76%
75.0000 mL | Freq: Once | INTRAVENOUS | Status: AC | PRN
  Administered 2016-06-28: 75 mL via INTRAVENOUS

## 2016-06-28 MED ORDER — HYDRALAZINE HCL 20 MG/ML IJ SOLN: 10.0000 mg | Freq: Four times a day (QID) | INTRAMUSCULAR | Status: DC | PRN

## 2016-06-28 MED ORDER — SENNOSIDES-DOCUSATE SODIUM 8.6-50 MG PO TABS: 1.0000 | ORAL_TABLET | Freq: Every evening | ORAL | Status: DC | PRN

## 2016-06-28 MED ORDER — ALBUTEROL SULFATE (2.5 MG/3ML) 0.083% IN NEBU: 3.0000 mL | INHALATION_SOLUTION | Freq: Four times a day (QID) | RESPIRATORY_TRACT | Status: DC | PRN

## 2016-06-28 NOTE — Care Management (Signed)
Admitted to this facility under observation status with the diagnosis of TIA. Lives at [facility] x 3 months. States she was in her own apartment prior to going back to [facility]. States she makes her own decisions, she has no guardian. Sister is [place] [phone]). "Don't call her, she is 64 years old." Uses no aids for ambulation. Takes care of all basic activities of daily living herself, doesn't drive. Uses [facility] for prescriptions. "Someone from the facility will drive me back to the facility." Shelbie Ammons RN MSN CCM Care Management

## 2016-06-28 NOTE — Evaluation (Signed)
Physical Therapy Evaluation Patient Details Name: Tammy Armstrong MRN: [id number] DOB: 01-Dec-1950 Today's Date: 06/28/2016   History of Present Illness  Pt had short term R facial and UE numbness that seems to have resolved.  She is reporting that she feels as though she's back to her baseline and wants to go home today if possible.   Clinical Impression  Pt did very well with PT exam and showed no safety more mobility issues.  She was able to ambulate w/o AD and negotiate steps all with good confidence.  Overall pt is independent with all acts and appears to be at baseline with no residual s/s of TIA/CVA.  Pt does not need further PT intervention.     Follow Up Recommendations No PT follow up    Equipment Recommendations       Recommendations for Other Services       Precautions / Restrictions Precautions Precautions: None Restrictions Weight Bearing Restrictions: No      Mobility  Bed Mobility Overal bed mobility: Independent                Transfers Overall transfer level: Independent Equipment used: None             General transfer comment: Pt able to rise with good confidence and no LOBs or safety issues  Ambulation/Gait Ambulation/Gait assistance: Independent Ambulation Distance (Feet): 300 Feet Assistive device: None       General Gait Details: Pt able to ambualte with consistent cadence and community appropriate speed.  She has no safety issues, minimal fatigue and overall did well.    Stairs Stairs: Yes   Stair Management: One rail Right Number of Stairs: 5 General stair comments: Pt able to negotiate steps safely and confidently, single UE use  Wheelchair Mobility    Modified Rankin (Stroke Patients Only)       Balance Overall balance assessment: No apparent balance deficits (not formally assessed)                                           Pertinent Vitals/Pain Pain Assessment: No/denies pain    Home Living  Family/patient expects to be discharged to:: Group home                      Prior Function Level of Independence: Independent         Comments: Pt reports that she does not get out a lot, but does a lot of walking and can do community acts when available     Hand Dominance        Extremity/Trunk Assessment   Upper Extremity Assessment: Overall WFL for tasks assessed (equal bilaterally)           Lower Extremity Assessment: Overall WFL for tasks assessed (equal bilaterally)         Communication   Communication: No difficulties  Cognition Arousal/Alertness: Awake/alert Behavior During Therapy: WFL for tasks assessed/performed Overall Cognitive Status: Within Functional Limits for tasks assessed                      General Comments      Exercises     Assessment/Plan    PT Assessment Patent does not need any further PT services  PT Problem List            PT Treatment Interventions  PT Goals (Current goals can be found in the Care Plan section)  Acute Rehab PT Goals Patient Stated Goal: go home today    Frequency     Barriers to discharge        Co-evaluation               End of Session Equipment Utilized During Treatment: Gait belt Activity Tolerance: Patient tolerated treatment well Patient left: in bed;with call bell/phone within reach      Functional Assessment Tool Used: clinical judgement Functional Limitation: Mobility: Walking and moving around Mobility: Walking and Moving Around Current Status [phone]): 0 percent impaired, limited or restricted Mobility: Walking and Moving Around Goal Status [phone]): 0 percent impaired, limited or restricted Mobility: Walking and Moving Around Discharge Status [phone]): 0 percent impaired, limited or restricted    Time: 1401-1418 PT Time Calculation (min) (ACUTE ONLY): 17 min   Charges:   PT Evaluation $PT Eval Low Complexity: 1 Procedure     PT G Codes:   PT  G-Codes **NOT FOR INPATIENT CLASS** Functional Assessment Tool Used: clinical judgement Functional Limitation: Mobility: Walking and moving around Mobility: Walking and Moving Around Current Status [phone]): 0 percent impaired, limited or restricted Mobility: Walking and Moving Around Goal Status [phone]): 0 percent impaired, limited or restricted Mobility: Walking and Moving Around Discharge Status [phone]): 0 percent impaired, limited or restricted    Kreg Shropshire, DPT 06/28/2016, 3:01 PM

## 2016-06-28 NOTE — Progress Notes (Signed)
*  PRELIMINARY RESULTS* Echocardiogram 2D Echocardiogram has been performed.  Sherrie Sport 06/28/2016, 12:58 PM

## 2016-06-28 NOTE — Care Management Obs Status (Signed)
[facility] NOTIFICATION   Patient Details  Name: LAYLONIE ENGBLOM MRN: [id number] Date of Birth: 08/18/1950   Medicare Observation Status Notification Given:  Yes    Shelbie Ammons, RN 06/28/2016, 8:25 AM

## 2016-06-28 NOTE — H&P (Signed)
[place] @ [facility] Admission History and Physical Harvie Bridge, D.O.  ---------------------------------------------------------------------------------------------------------------------   PATIENT NAME: Tammy Armstrong MR#: [id number] DATE OF BIRTH: [birth year] DATE OF ADMISSION: 06/27/2016 PRIMARY CARE PHYSICIAN: Gayland Curry, MD  REQUESTING/REFERRING PHYSICIAN: ED Dr. Jacqualine Code  CHIEF COMPLAINT: Chief Complaint  Patient presents with  . arm numbness  . Arm Pain    HISTORY OF PRESENT ILLNESS: Tammy Armstrong is a 65 y.o. female with a known history of Carotid artery disease, COPD, diabetes, GERD, hypertension, hyperlipidemia, seizures and CVA was in a usual state of health until yesterday when she describes the sudden onset of numbness and tingling over the right face right arm and hand associated with some weakness. She reports a throbbing headache accompanying these symptom and stated that she has had similar symptoms about a year and a half ago when she had a CVA (presumed secondary to left carotid artery disease and is now status post CEA). She also reports associated difficulty with speech yesterday. She states that she had difficulty getting out the words that she wanted to say. She denies any recent head or neck trauma and no history of cervical spine arthritis or disc disease.  At this time she reports that her headache is significantly improved however she still has persistent mild numbness and tingling of the right arm.  Otherwise there has been no change in status. Patient has been taking medication as prescribed and there has been no recent change in medication or diet.  There has been no recent illness, travel or sick contacts.    Patient denies fevers/chills, dizziness, chest pain, shortness of breath, N/V/C/D, abdominal pain, dysuria/frequency, changes in mental status.   EMS/ED COURSE:   Patient received aspirin.  PAST MEDICAL HISTORY: Past Medical  History:  Diagnosis Date  . Anxiety   . Arthritis   . Carotid artery occlusion   . Cigarette smoker one half pack a day or less   . COPD (chronic obstructive pulmonary disease) ([place]) 07/04/2011  . Depression   . Diabetes mellitus   . Diabetes mellitus 07/04/2011  . GERD (gastroesophageal reflux disease)   . Hepatitis C   . Hypercholesterolemia   . Hypertension   . Migraines   . Pancreatitis   . 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 Respiratory failure ([place]) 07/04/2011  . Seizure ([place]) 07/02/2011  . Shortness of breath dyspnea   . Stroke [facility])       PAST SURGICAL HISTORY: Past Surgical History:  Procedure Laterality Date  . ABDOMINAL HYSTERECTOMY    . APPENDECTOMY    . BACK SURGERY  2012   cyst removed from spine , [place]  . CHOLECYSTECTOMY    . ENDARTERECTOMY Left 05/18/2015   Procedure: ENDARTERECTOMY CAROTID;  Surgeon: Algernon Huxley, MD;  Location: [facility] ORS;  Service: Vascular;  Laterality: Left;      SOCIAL HISTORY: Social History  Substance Use Topics  . Smoking status: Current Every Day Smoker    Packs/day: 1.00    Types: Cigarettes  . Smokeless tobacco: Never Used  . Alcohol use No      FAMILY HISTORY: Family History  Problem Relation Age of Onset  . Hypertension Mother   . Heart attack Mother   . Diabetes Mother   . Anxiety disorder Mother   . Stroke Mother   . Diabetes Father   . Diabetes Brother   . Alcohol abuse      siblings  . Hypertension Brother   Confirmed with patient.   MEDICATIONS AT  HOME: Prior to Admission medications   Medication Sig Start Date End Date Taking? Authorizing Provider  albuterol (PROVENTIL HFA;VENTOLIN HFA) 108 (90 BASE) MCG/ACT inhaler Inhale 2 puffs into the lungs every 6 (six) hours as needed for wheezing or shortness of breath.    Yes Historical Provider, MD  aspirin EC 81 MG EC tablet Take 1 tablet (81 mg total) by mouth daily. 05/19/15  Yes Algernon Huxley, MD  atorvastatin (LIPITOR) 40 MG tablet Take 1  tablet (40 mg total) by mouth daily at 6 PM. Patient taking differently: Take 40 mg by mouth daily.  04/08/15  Yes Gladstone Lighter, MD  budesonide-formoterol (SYMBICORT) 80-4.5 MCG/ACT inhaler Inhale 2 puffs into the lungs 2 (two) times daily. 04/08/15  Yes Gladstone Lighter, MD  clopidogrel (PLAVIX) 75 MG tablet Take 1 tablet (75 mg total) by mouth daily with breakfast. 04/02/16  Yes Earleen Newport, MD  docusate sodium (COLACE) 100 MG capsule Take 100 mg by mouth daily.    Yes Historical Provider, MD  DULoxetine (CYMBALTA) 60 MG capsule Take 60 mg by mouth 2 (two) times daily.   Yes Historical Provider, MD  fluticasone furoate-vilanterol (BREO ELLIPTA) 100-25 MCG/INH AEPB Inhale 1 puff into the lungs daily.   Yes Historical Provider, MD  gabapentin (NEURONTIN) 300 MG capsule Take 300 mg by mouth 4 (four) times daily.   Yes Historical Provider, MD  glipiZIDE (GLUCOTROL XL) 5 MG 24 hr tablet Take 1 tablet (5 mg total) by mouth daily. 04/02/16  Yes Earleen Newport, MD  ibuprofen (ADVIL,MOTRIN) 200 MG tablet Take 200 mg by mouth every 6 (six) hours as needed for headache or moderate pain.   Yes Historical Provider, MD  lisinopril (PRINIVIL,ZESTRIL) 20 MG tablet Take 1 tablet (20 mg total) by mouth daily. 04/02/16  Yes Earleen Newport, MD  meloxicam (MOBIC) 7.5 MG tablet Take 7.5 mg by mouth 2 (two) times daily.   Yes Historical Provider, MD  metFORMIN (GLUCOPHAGE-XR) 500 MG 24 hr tablet Take 500 mg by mouth 2 (two) times daily.   Yes Historical Provider, MD  metoprolol (TOPROL-XL) 50 MG 24 hr tablet Take 50 mg by mouth daily.     Yes Historical Provider, MD  ondansetron (ZOFRAN) 4 MG tablet Take 1 tablet (4 mg total) by mouth daily as needed for nausea or vomiting. 08/03/15  Yes Earleen Newport, MD  pantoprazole (PROTONIX) 40 MG tablet Take 40 mg by mouth 2 (two) times daily.   Yes Historical Provider, MD  QUEtiapine (SEROQUEL) 25 MG tablet Take 25 mg by mouth at bedtime.   Yes Historical  Provider, MD  traZODone (DESYREL) 50 MG tablet Take 50 mg by mouth at bedtime.   Yes Historical Provider, MD  ziprasidone (GEODON) 40 MG capsule Take 40 mg by mouth 2 (two) times daily with a meal.    Yes Historical Provider, MD  ALPRAZolam (XANAX) 0.5 MG tablet Take 1 tablet 3 times daily for 4 days, then 1 tablet twice daily for 4 days, then 1 tablet daily for 4 days, then 1 tablet every other day until gone Patient not taking: Reported on 06/27/2016 04/02/16   Earleen Newport, MD      DRUG ALLERGIES: Allergies  Allergen Reactions  . Promethazine Hcl Other (See Comments)    Reaction:  Impaired speech   . Ciprofloxacin Other (See Comments)    Medication is contraindicated with some of pts other meds.    . Doxycycline Nausea And Vomiting  . Imitrex [Sumatriptan Base]  Other (See Comments)    Pt states that it "puts her out of this world".    . Morphine And Related Nausea And Vomiting  . Penicillins Nausea And Vomiting and Other (See Comments)    Pt is unable to answer additional questions about this medication.    . Zantac [Ranitidine Hcl] Other (See Comments)    Reaction:  Unknown   . Ketorolac Tromethamine Rash     REVIEW OF SYSTEMS: CONSTITUTIONAL: No fever/chills, fatigue,weight gain/loss, positive headache and weakness EYES: No blurry or double vision. ENT: No tinnitus, postnasal drip, redness or soreness of the oropharynx. RESPIRATORY: No cough, wheeze, hemoptysis, dyspnea. CARDIOVASCULAR: No chest pain, orthopnea, palpitations, syncope. GASTROINTESTINAL: No nausea, vomiting, constipation, diarrhea, abdominal pain, hematemesis, melena or hematochezia. GENITOURINARY: No dysuria or hematuria. ENDOCRINE: No polyuria or nocturia. No heat or cold intolerance. HEMATOLOGY: No anemia, bruising, bleeding. INTEGUMENTARY: No rashes, ulcers, lesions. MUSCULOSKELETAL: No arthritis, swelling, gout. Negative for back or neck pain NEUROLOGIC: Positive numbness, tingling, weakness  negative ataxia. No seizure-type activity. Positive speech disturbance PSYCHIATRIC: No anxiety, depression, insomnia.  PHYSICAL EXAMINATION: VITAL SIGNS: Blood pressure (!) 199/67, pulse 64, temperature 98.1 F (36.7 C), temperature source Oral, resp. rate 18, height 5\' 2"  (1.575 m), weight 76.2 kg (168 lb), SpO2 99 %.  GENERAL: 65 y.o.-year-old white female patient, well-developed, well-nourished lying in the bed in no acute distress.  Pleasant and cooperative.   HEENT: Head atraumatic, normocephalic. Pupils equal, round, reactive to light and accommodation. No scleral icterus. Extraocular muscles intact. Nares are patent. Oropharynx is clear. Mucus membranes moist. NECK: Supple, full range of motion. No JVD, no bruit heard. No thyroid enlargement, no tenderness, no cervical lymphadenopathy. CHEST: Normal breath sounds bilaterally. No wheezing, rales, rhonchi or crackles. No use of accessory muscles of respiration.  No reproducible chest wall tenderness.  CARDIOVASCULAR: S1, S2 normal. No murmurs, rubs, or gallops. Cap refill <2 seconds. ABDOMEN: Soft, nontender, nondistended. No rebound, guarding, rigidity. Normoactive bowel sounds present in all four quadrants. No organomegaly or mass. EXTREMITIES: No pedal edema, cyanosis, or clubbing. Spine exhibits mild paravertebral muscle spasm of the cervical spine NEUROLOGIC: Cranial nerves II through XII are grossly intact with no focal sensorimotor deficit. Muscle strength 5/5 in all extremities. Sensation intact. Gait not checked. PSYCHIATRIC: The patient is alert and oriented x 3. Normal affect, mood, thought content. SKIN: Warm, dry, and intact without obvious rash, lesion, or ulcer.  LABORATORY PANEL:  CBC  Recent Labs Lab 06/27/16 1734  WBC 6.7  HGB 10.8*  HCT 32.5*  PLT 229   ----------------------------------------------------------------------------------------------------------------- Chemistries  Recent Labs Lab 06/27/16 1734   NA 140  K 3.8  CL 107  CO2 27  GLUCOSE 156*  BUN 22*  CREATININE 0.83  CALCIUM 9.0  AST 25  ALT 19  ALKPHOS 72  BILITOT 0.2*   ------------------------------------------------------------------------------------------------------------------ Cardiac Enzymes  Recent Labs Lab 06/27/16 1734  TROPONINI <0.03   ------------------------------------------------------------------------------------------------------------------  RADIOLOGY: Ct Head Wo Contrast  Result Date: 06/27/2016 CLINICAL DATA:  Right arm pain and numbness. EXAM: CT HEAD WITHOUT CONTRAST TECHNIQUE: Contiguous axial images were obtained from the base of the skull through the vertex without intravenous contrast. COMPARISON:  04/29/2015 FINDINGS: Brain: No evidence of acute infarction, hemorrhage, hydrocephalus, extra-axial collection or mass lesion/mass effect. Calcification within the mid pons is again noted. 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 Mild low attenuation within the subcortical and periventricular white matter noted compatible with chronic microvascular disease. No evidence for  acute brain infarct, intracranial hemorrhage or mass. Vascular: No hyperdense vessel or unexpected calcification. Skull: Normal. Negative for fracture or focal lesion. Sinuses/Orbits: The paranasal sinuses and mastoid air cells are clear. The calvarium is intact. Other: None. IMPRESSION: 1. No acute intracranial abnormality identified. 2. Chronic changes as described above. Electronically Signed   By: Kerby Moors M.D.   On: 06/27/2016 17:59    EKG:  IMPRESSION AND PLAN:  This is a 65 y.o. female with a history of Carotid artery disease, COPD, diabetes, GERD, hypertension, hyperlipidemia, seizures and CVA  now being admitted with:  1. Right-sided weakness and impaired speech in the setting of prior CVA and carotid artery disease. There is concern for TIA, CVA but hypertensive  emergency muscle becomes considered and the diagnosis given patient's headache and elevated blood pressure.  If neuro workup is negative would also consider cervical spine degenerative disc disease or DJD.  - Admit telemetry observation for neuro workup including: - Studies: MRA/MRI, Echo, Carotids - Labs: CBC, BMP, Lipids, TFTs, A1C - Nursing: Neurochecks, O2, dysphagia screen, permissive hypertension.  - Consults: Neurology, PT/OT, S/S consults.  - Meds: Daily aspirin 81mg . continue Plavix. - Fluids: IVNS@75cc /hr.   - Routine DVT Px: with Lovenox, SCDs, early ambulation  2. Anemia, mild-we'll monitor and repeat CBC in a.m. 3. History of COPD-continue albuterol, Symbicort, Breo Ellipta 4. History of CVA-continue aspirin and Plavix 5. History of hyperlipidemia-continue Lipitor 6. History of hypertension-hold Toprol and lisinopril for permissive hypertension and resume when stable. 7. History of diabetes-hold metformin and glipizide and cover with regular insulin sliding scale 8. History of GERD-continue Protonix 9. History of anxiety and depression-continue Cymbalta, Seroquel and Neurontin. trazodone, Geodon.  Hold Xanax for neuro checks. 10. History of pain-continue  Code Status: Full  All the records are reviewed and case discussed with ED provider. Management plans discussed with the patient and/or family who express understanding and agree with plan of care.   TOTAL TIME TAKING CARE OF THIS PATIENT: 60 minutes.   Tammy Armstrong D.O. on 06/28/2016 at 1:10 AM Between 7am to 6pm - Pager - [phone] After 6pm go to [email or web address] - [employer] [facility] [place] [facility] Office [phone] CC: Primary care physician; Gayland Curry, MD     Note: This dictation was prepared with Dragon dictation along with smaller phrase technology. Any transcriptional errors that result from this process are unintentional.

## 2016-06-28 NOTE — Progress Notes (Signed)
Dr. Bridgett Larsson notified patient states she is "leaving today whether she is discharged or not". I explained we are waiting on test results to come back and she has a CT Angio pending to be done. Patient states she "refuses to have any more tests run on her and is getting out of the hospital today no matter what."

## 2016-06-28 NOTE — Consult Note (Signed)
Referring Physician: Bridgett Larsson    Chief Complaint: RUE numbness  HPI: Tammy Armstrong is an 65 y.o. female who reports that on Tuesday she had an episode of slurred speech that lasted about an hour and resolved spontaneously.  She went to bed at baseline.  She awakened on yesterday and noted left arm numbness that did not improve despite waiting for a good portion of the day.  Presented for evaluation.  Symptoms have improved and she today is again at baseline.   Patient with a history of stroke and TIA, s/p left CEA.  Initial NIHSS of 0.   Patient has a history of seizures but can not recall what they are like.    Date last known well: Date: 06/26/2016 Time last known well: Time: 19:00 tPA Given: No: Improvement in symptoms  Past Medical History:  Diagnosis Date  . Anxiety   . Arthritis   . Carotid artery occlusion   . Cigarette smoker one half pack a day or less   . COPD (chronic obstructive pulmonary disease) ([place]) 07/04/2011  . Depression   . Diabetes mellitus   . Diabetes mellitus 07/04/2011  . GERD (gastroesophageal reflux disease)   . Hepatitis C   . Hypercholesterolemia   . Hypertension   . Migraines   . Pancreatitis   . 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 Respiratory failure ([place]) 07/04/2011  . Seizure ([place]) 07/02/2011  . Shortness of breath dyspnea   . Stroke [facility])     Past Surgical History:  Procedure Laterality Date  . ABDOMINAL HYSTERECTOMY    . APPENDECTOMY    . BACK SURGERY  2012   cyst removed from spine , [place]  . CHOLECYSTECTOMY    . ENDARTERECTOMY Left 05/18/2015   Procedure: ENDARTERECTOMY CAROTID;  Surgeon: Algernon Huxley, MD;  Location: [facility] ORS;  Service: Vascular;  Laterality: Left;    Family History  Problem Relation Age of Onset  . Hypertension Mother   . Heart attack Mother   . Diabetes Mother   . Anxiety disorder Mother   . Stroke Mother   . Diabetes Father   . Diabetes Brother   . Alcohol abuse      siblings  . Hypertension Brother     Social History:  reports that she has been smoking Cigarettes.  She has been smoking about 1.00 pack per day. She has never used smokeless tobacco. She reports that she does not drink alcohol or use drugs.  Allergies:  Allergies  Allergen Reactions  . Promethazine Hcl Other (See Comments)    Reaction:  Impaired speech   . Ciprofloxacin Other (See Comments)    Medication is contraindicated with some of pts other meds.    . Doxycycline Nausea And Vomiting  . Imitrex [Sumatriptan Base] Other (See Comments)    Pt states that it "puts her out of this world".    . Morphine And Related Nausea And Vomiting  . Penicillins Nausea And Vomiting and Other (See Comments)    Pt is unable to answer additional questions about this medication.    . Zantac [Ranitidine Hcl] Other (See Comments)    Reaction:  Unknown   . Ketorolac Tromethamine Rash    Medications:  I have reviewed the patient's current medications. Prior to Admission:  Prescriptions Prior to Admission  Medication Sig Dispense Refill Last Dose  . albuterol (PROVENTIL HFA;VENTOLIN HFA) 108 (90 BASE) MCG/ACT inhaler Inhale 2 puffs into the lungs every 6 (six) hours as needed for wheezing or shortness  of breath.    PRN at PRN  . aspirin EC 81 MG EC tablet Take 1 tablet (81 mg total) by mouth daily. 90 tablet 3 06/27/2016 at Unknown time  . atorvastatin (LIPITOR) 40 MG tablet Take 1 tablet (40 mg total) by mouth daily at 6 PM. (Patient taking differently: Take 40 mg by mouth daily. ) 30 tablet 2 06/27/2016 at Unknown time  . budesonide-formoterol (SYMBICORT) 80-4.5 MCG/ACT inhaler Inhale 2 puffs into the lungs 2 (two) times daily. 1 Inhaler 2 06/27/2016 at Unknown time  . clopidogrel (PLAVIX) 75 MG tablet Take 1 tablet (75 mg total) by mouth daily with breakfast. 30 tablet 3 06/27/2016 at Unknown time  . docusate sodium (COLACE) 100 MG capsule Take 100 mg by mouth daily.    03/31/2016 at am  . DULoxetine (CYMBALTA) 60 MG capsule Take 60 mg by  mouth 2 (two) times daily.   06/27/2016 at Unknown time  . fluticasone furoate-vilanterol (BREO ELLIPTA) 100-25 MCG/INH AEPB Inhale 1 puff into the lungs daily.   06/27/2016 at Unknown time  . gabapentin (NEURONTIN) 300 MG capsule Take 300 mg by mouth 4 (four) times daily.   06/27/2016 at Unknown time  . glipiZIDE (GLUCOTROL XL) 5 MG 24 hr tablet Take 1 tablet (5 mg total) by mouth daily. 30 tablet 2 06/27/2016 at Unknown time  . ibuprofen (ADVIL,MOTRIN) 200 MG tablet Take 200 mg by mouth every 6 (six) hours as needed for headache or moderate pain.   PRN at PRN  . lisinopril (PRINIVIL,ZESTRIL) 20 MG tablet Take 1 tablet (20 mg total) by mouth daily. 30 tablet 2 06/27/2016 at Unknown time  . meloxicam (MOBIC) 7.5 MG tablet Take 7.5 mg by mouth 2 (two) times daily.   06/27/2016 at Unknown time  . metFORMIN (GLUCOPHAGE-XR) 500 MG 24 hr tablet Take 500 mg by mouth 2 (two) times daily.   06/27/2016 at Unknown time  . metoprolol (TOPROL-XL) 50 MG 24 hr tablet Take 50 mg by mouth daily.     06/27/2016 at Unknown time  . ondansetron (ZOFRAN) 4 MG tablet Take 1 tablet (4 mg total) by mouth daily as needed for nausea or vomiting. 20 tablet 1 PRN at PRN  . pantoprazole (PROTONIX) 40 MG tablet Take 40 mg by mouth 2 (two) times daily.   06/27/2016 at Unknown time  . QUEtiapine (SEROQUEL) 25 MG tablet Take 25 mg by mouth at bedtime.   06/26/2016 at Unknown time  . traZODone (DESYREL) 50 MG tablet Take 50 mg by mouth at bedtime.   06/26/2016 at Unknown time  . ziprasidone (GEODON) 40 MG capsule Take 40 mg by mouth 2 (two) times daily with a meal.    06/27/2016 at Unknown time  . ALPRAZolam (XANAX) 0.5 MG tablet Take 1 tablet 3 times daily for 4 days, then 1 tablet twice daily for 4 days, then 1 tablet daily for 4 days, then 1 tablet every other day until gone (Patient not taking: Reported on 06/27/2016) 30 tablet 0 Not Taking at Unknown time   Scheduled: . aspirin EC  81 mg Oral Daily  . atorvastatin  40 mg Oral Daily  .  clopidogrel  75 mg Oral Q breakfast  . docusate sodium  100 mg Oral Daily  . DULoxetine  60 mg Oral BID  . enoxaparin (LOVENOX) injection  40 mg Subcutaneous QHS  . fluticasone furoate-vilanterol  1 puff Inhalation Daily  . gabapentin  300 mg Oral QID  . insulin aspart  0-15 Units Subcutaneous  TID WC  . insulin aspart  0-5 Units Subcutaneous QHS  . meloxicam  7.5 mg Oral BID  . pantoprazole  40 mg Oral BID AC  . QUEtiapine  25 mg Oral QHS  . traZODone  50 mg Oral QHS  . ziprasidone  40 mg Oral BID WC    ROS: History obtained from the patient  General ROS: negative for - chills, fatigue, fever, night sweats, weight gain or weight loss Psychological ROS: negative for - behavioral disorder, hallucinations, memory difficulties, mood swings or suicidal ideation Ophthalmic ROS: negative for - blurry vision, double vision, eye pain or loss of vision ENT ROS: negative for - epistaxis, nasal discharge, oral lesions, sore throat, tinnitus or vertigo Allergy and Immunology ROS: negative for - hives or itchy/watery eyes Hematological and Lymphatic ROS: negative for - bleeding problems, bruising or swollen lymph nodes Endocrine ROS: negative for - galactorrhea, hair pattern changes, polydipsia/polyuria or temperature intolerance Respiratory ROS: negative for - cough, hemoptysis, shortness of breath or wheezing Cardiovascular ROS: negative for - chest pain, dyspnea on exertion, edema or irregular heartbeat Gastrointestinal ROS: negative for - abdominal pain, diarrhea, hematemesis, nausea/vomiting or stool incontinence Genito-Urinary ROS: negative for - dysuria, hematuria, incontinence or urinary frequency/urgency Musculoskeletal ROS: negative for - joint swelling or muscular weakness Neurological ROS: as noted in HPI Dermatological ROS: negative for rash and skin lesion changes  Physical Examination: Blood pressure (!) 155/61, pulse 60, temperature 98.6 F (37 C), temperature source Oral, resp.  rate 16, height 5\' 2"  (1.575 m), weight 76.2 kg (168 lb), SpO2 97 %.  HEENT-  Normocephalic, no lesions, without obvious abnormality.  Normal external eye and conjunctiva.  Normal TM's bilaterally.  Normal auditory canals and external ears. Normal external nose, mucus membranes and septum.  Normal pharynx. Cardiovascular- S1, S2 normal, pulses palpable throughout   Lungs- chest clear, no wheezing, rales, normal symmetric air entry Abdomen- soft, non-tender; bowel sounds normal; no masses,  no organomegaly Extremities- no edema Lymph-no adenopathy palpable Musculoskeletal-no joint tenderness, deformity or swelling Skin-warm and dry, no hyperpigmentation, vitiligo, or suspicious lesions  Neurological Examination Mental Status: Alert, oriented, thought content appropriate.  Speech fluent without evidence of aphasia.  Able to follow 3 step commands without difficulty. Cranial Nerves: II: Discs flat bilaterally; Visual fields grossly normal, pupils equal, round, reactive to light and accommodation III,IV, VI: ptosis not present, extra-ocular motions intact bilaterally V,VII: smile symmetric, facial light touch sensation normal bilaterally VIII: hearing normal bilaterally IX,X: gag reflex present XI: bilateral shoulder shrug XII: midline tongue extension Motor: Right : Upper extremity   5/5    Left:     Upper extremity   5/5  Lower extremity   5/5     Lower extremity   5/5 Tone and bulk:normal tone throughout; no atrophy noted Sensory: Pinprick and light touch intact throughout, bilaterally Deep Tendon Reflexes: 2+ and symmetric throughout Plantars: Right: equivocal   Left: equivocal Cerebellar: Normal finger-to-nose and normal heel-to-shin testing bilaterally Gait: normal gait and station    Laboratory Studies:  Basic Metabolic Panel:  Recent Labs Lab 06/27/16 1734  NA 140  K 3.8  CL 107  CO2 27  GLUCOSE 156*  BUN 22*  CREATININE 0.83  CALCIUM 9.0    Liver Function  Tests:  Recent Labs Lab 06/27/16 1734  AST 25  ALT 19  ALKPHOS 72  BILITOT 0.2*  PROT 7.4  ALBUMIN 3.9   No results for input(s): LIPASE, AMYLASE in the last 168 hours. No results for  input(s): AMMONIA in the last 168 hours.  CBC:  Recent Labs Lab 06/27/16 1734  WBC 6.7  NEUTROABS 3.2  HGB 10.8*  HCT 32.5*  MCV 82.9  PLT 229    Cardiac Enzymes:  Recent Labs Lab 06/27/16 1734 06/28/16 0041 06/28/16 0706  TROPONINI <0.03 <0.03 <0.03    BNP: Invalid input(s): POCBNP  CBG:  Recent Labs Lab 06/28/16 0039 06/28/16 0619 06/28/16 0759  GLUCAP 126* 154* 172*    Microbiology: Results for orders placed or performed during the hospital encounter of 06/27/16  MRSA PCR Screening     Status: Abnormal   Collection Time: 06/28/16  1:01 AM  Result Value Ref Range Status   MRSA by PCR (A) NEGATIVE Final    INVALID, UNABLE TO DETERMINE THE PRESENCE OF TARGET DNA DUE TO SPECIMEN INTEGRITY. RECOLLECTION REQUESTED.    Comment:        The GeneXpert MRSA Assay (FDA approved for NASAL specimens only), is one component of a comprehensive MRSA colonization surveillance program. It is not intended to diagnose MRSA infection nor to guide or monitor treatment for MRSA infections. REQUEST FOR RECOLLECT CALLED TO CYNTHIA BURGESS AT [id number] ON 06/28/16 [place].     Coagulation Studies:  Recent Labs  06/27/16 1734  LABPROT 13.0  INR 0.98    Urinalysis:  Recent Labs Lab 06/28/16 0830  COLORURINE STRAW*  LABSPEC 1.006  PHURINE 7.0  GLUCOSEU NEGATIVE  HGBUR NEGATIVE  BILIRUBINUR NEGATIVE  KETONESUR NEGATIVE  PROTEINUR NEGATIVE  NITRITE NEGATIVE  LEUKOCYTESUR NEGATIVE    Lipid Panel:    Component Value Date/Time   CHOL 131 06/28/2016 0706   CHOL 118 07/11/2014 0546   TRIG 169 (H) 06/28/2016 0706   TRIG 169 07/11/2014 0546   HDL 33 (L) 06/28/2016 0706   HDL 28 (L) 07/11/2014 0546   CHOLHDL 4.0 06/28/2016 0706   VLDL 34 06/28/2016 0706   VLDL 34 07/11/2014  0546   LDLCALC 64 06/28/2016 0706   LDLCALC 56 07/11/2014 0546    HgbA1C:  Lab Results  Component Value Date   HGBA1C 5.3 04/06/2015    Urine Drug Screen:     Component Value Date/Time   LABOPIA NONE DETECTED 06/28/2016 0830   COCAINSCRNUR NONE DETECTED 06/28/2016 0830   LABBENZ NONE DETECTED 06/28/2016 0830   AMPHETMU NONE DETECTED 06/28/2016 0830   THCU NONE DETECTED 06/28/2016 0830   LABBARB NONE DETECTED 06/28/2016 0830    Alcohol Level: No results for input(s): ETH in the last 168 hours.  Other results: EKG: sinus bradycardia at 58 bpm.  Imaging: Ct Head Wo Contrast  Result Date: 06/27/2016 CLINICAL DATA:  Right arm pain and numbness. EXAM: CT HEAD WITHOUT CONTRAST TECHNIQUE: Contiguous axial images were obtained from the base of the skull through the vertex without intravenous contrast. COMPARISON:  04/29/2015 FINDINGS: Brain: No evidence of acute infarction, hemorrhage, hydrocephalus, extra-axial collection or mass lesion/mass effect. Calcification within the mid pons is again noted. 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 Mild low attenuation within the subcortical and periventricular white matter noted compatible with chronic microvascular disease. No evidence for acute brain infarct, intracranial hemorrhage or mass. Vascular: No hyperdense vessel or unexpected calcification. Skull: Normal. Negative for fracture or focal lesion. Sinuses/Orbits: The paranasal sinuses and mastoid air cells are clear. The calvarium is intact. Other: None. IMPRESSION: 1. No acute intracranial abnormality identified. 2. Chronic changes as described above. Electronically Signed   By: Kerby Moors M.D.   On:  06/27/2016 17:59   US Carotid Bilateral (at [facility] Only)  Result Date: 06/28/2016 CLINICAL DATA:  TIA/stroke. History of left-sided carotid stent. History of hypertension, hyperlipidemia, diabetes and smoking. EXAM: BILATERAL CAROTID  DUPLEX ULTRASOUND TECHNIQUE: Pearline Cables scale imaging, color Doppler and duplex ultrasound were performed of bilateral carotid and vertebral arteries in the neck. COMPARISON:  None. FINDINGS: Criteria: Quantification of carotid stenosis is based on velocity parameters that correlate the residual internal carotid diameter with NASCET-based stenosis levels, using the diameter of the distal internal carotid lumen as the denominator for stenosis measurement. The following velocity measurements were obtained: RIGHT ICA:  142/41 cm/sec CCA:  123456 cm/sec SYSTOLIC ICA/CCA RATIO:  1.5 DIASTOLIC ICA/CCA RATIO:  1.7 ECA:  153 cm/sec LEFT ICA:  213/75 cm/sec CCA:  123XX123 cm/sec SYSTOLIC ICA/CCA RATIO:  1.7 DIASTOLIC ICA/CCA RATIO:  2.9 ECA:  176 cm/sec RIGHT CAROTID ARTERY: There is a minimal amount of eccentric mixed echogenic plaque within distal aspect the right common carotid artery (image 11). There is a moderate amount of eccentric mixed echogenic partially shadowing plaque within the right carotid bulb (images 14 and 15), extending to involve the origin and proximal aspect the right internal carotid artery (image 22) which results in elevated peak systolic velocities throughout the interrogated course the right internal carotid artery (greatest acquired peak systolic velocity within mid ICA measures 142 cm/sec - image 27). RIGHT VERTEBRAL ARTERY:  Antegrade Flow LEFT CAROTID ARTERY: Post stenting of the left carotid bulb. There is a minimal to moderate amount of in-stent intimal wall thickening within the left carotid bulb (representative image 46). There are residual/recurrent elevated peak systolic velocities within the left internal carotid artery (greatest acquired peak systolic velocity within the distal aspect of the left ICA measures 213 cm/sec - image 63). LEFT VERTEBRAL ARTERY:  Antegrade flow IMPRESSION: 1. Post left carotid bulb stenting with in-stent intimal wall thickening which results in elevated peak systolic  velocities within the left internal carotid artery compatible with the higher end of the 50-69% luminal narrowing range. Further evaluation with CTA could performed as clinically indicated. 2. Minimal to moderate amount of right-sided atherosclerotic plaque results in elevated peak systolic velocities within the right internal carotid artery compatible with the 50-69% luminal narrowing range. Electronically Signed   By: Sandi Mariscal M.D.   On: 06/28/2016 09:53    Assessment: 65 y.o. female presenting with RUE numbness that has resolved.  Patient reports now being at baseline.  On ASA and Plavix.  Head CT reviewed and shows no acute changes.  TIA likely but can not rule out small acute infarct as well.  Carotid dopplers suggests bilateral ICA stenosis at 50-69%.  LDL 64 on statin therapy.  A1c pending.  Echocardiogram pending.    Stroke Risk Factors - diabetes mellitus, hyperlipidemia and hypertension  Plan: 1. CTA of head and neck 2. Prophylactic therapy-Continue ASA and Plavix 3. Telemetry monitoring 4. Frequent neuro checks 5. EEG 6. Patient may benefit from holter/LINQ monitoring on an outpatient basis.     Alexis Goodell, MD Neurology [phone] 06/28/2016, 11:24 AM

## 2016-06-28 NOTE — Evaluation (Addendum)
Clinical/Bedside Swallow Evaluation Patient Details  Name: Tammy Armstrong MRN: [id number] Date of Birth: [birth year]  Today's Date: 06/28/2016 Time: SLP Start Time (ACUTE ONLY): [id number] SLP Stop Time (ACUTE ONLY): 1355 SLP Time Calculation (min) (ACUTE ONLY): 50 min  Past Medical History:  Past Medical History:  Diagnosis Date  . Anxiety   . Arthritis   . Carotid artery occlusion   . Cigarette smoker one half pack a day or less   . COPD (chronic obstructive pulmonary disease) ([place]) 07/04/2011  . Depression   . Diabetes mellitus   . Diabetes mellitus 07/04/2011  . GERD (gastroesophageal reflux disease)   . Hepatitis C   . Hypercholesterolemia   . Hypertension   . Migraines   . Pancreatitis   . 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 Respiratory failure ([place]) 07/04/2011  . Seizure ([place]) 07/02/2011  . Shortness of breath dyspnea   . Stroke [facility])    Past Surgical History:  Past Surgical History:  Procedure Laterality Date  . ABDOMINAL HYSTERECTOMY    . APPENDECTOMY    . BACK SURGERY  2012   cyst removed from spine , [place]  . CHOLECYSTECTOMY    . ENDARTERECTOMY Left 05/18/2015   Procedure: ENDARTERECTOMY CAROTID;  Surgeon: Algernon Huxley, MD;  Location: [facility] ORS;  Service: Vascular;  Laterality: Left;   HPI:  Pt is a 65 y.o. female with a known history of Carotid artery disease, COPD, diabetes, GERD, hypertension, hyperlipidemia, seizures and CVA was in a usual state of health until yesterday when she describes the sudden onset of numbness and tingling over the right face right arm and hand associated with some weakness. She reports a throbbing headache accompanying these symptom and stated that she has had similar symptoms about a year and a half ago when she had a CVA (presumed secondary to left carotid artery disease and is now status post CEA). She also reports associated difficulty with speech yesterday. She states that she had difficulty getting out the words that she wanted to  say. She denies any recent head or neck trauma and no history of cervical spine arthritis or disc disease. Currently, pt is verbally conversive and denying any further speech deficits. Lives at [place] x 3 months. States she was in her own apartment prior to going back to [facility]. States she makes her own decisions, she has no guardian; independent w/ her own ADLs per report.   Assessment / Plan / Recommendation Clinical Impression  Pt appears to adequately tolerate po trials of thin liquids and puree/soft solids w/ no overt s/s of aspiration noted; no decline in vocal quality or respiratory status. Oral phase appeared grossly wfl w/ trial consistencies. Pt fed self given min set up assistance. Pt denied any difficulty w/ eating her meals or taking Pills w/ NSG; NSG agreed. Pt is verbally communciating in conversation w/ SLP; she denied any deficits w/ her speech today. She is verbally communicating w/ NSG and other staff appropriately indicating her wants/needs. No skilled ST services indicated at this time as pt appears at her baseline per her report and w/ evaluation. Recommend continue w/ current diet w/ general aspiration precautions. NSG to reconsult if any change in status noted during this admission. Pt agreed. Addendum: pt would like to have the meats cut w/ gravy for easier mastication d/t edentulous status at this time.    Aspiration Risk   (reduced following standard precautions)    Diet Recommendation  Regular/Mech Sosft(meats cut and  moist); thin liquids. General aspiration precautions.   Medication Administration: Whole meds with liquid (as tolerates)    Other  Recommendations Recommended Consults:  (none at this time) Oral Care Recommendations: Oral care BID;Staff/trained caregiver to provide oral care   Follow up Recommendations None      Frequency and Duration            Prognosis Prognosis for Safe Diet Advancement: Good Barriers to Reach Goals:   (none)      Swallow Study   General Date of Onset: 06/27/16 HPI: Pt is a 65 y.o. female with a known history of Carotid artery disease, COPD, diabetes, GERD, hypertension, hyperlipidemia, seizures and CVA was in a usual state of health until yesterday when she describes the sudden onset of numbness and tingling over the right face right arm and hand associated with some weakness. She reports a throbbing headache accompanying these symptom and stated that she has had similar symptoms about a year and a half ago when she had a CVA (presumed secondary to left carotid artery disease and is now status post CEA). She also reports associated difficulty with speech yesterday. She states that she had difficulty getting out the words that she wanted to say. She denies any recent head or neck trauma and no history of cervical spine arthritis or disc disease. Currently, pt is verbally conversive and denying any further speech deficits. Lives at [place] x 3 months. States she was in her own apartment prior to going back to [facility]. States she makes her own decisions, she has no guardian; independent w/ her own ADLs per report. Type of Study: Bedside Swallow Evaluation Previous Swallow Assessment: none indicated  Diet Prior to this Study: Regular;Thin liquids Temperature Spikes Noted: No (wbc 6.7) Respiratory Status: Room air History of Recent Intubation: No Behavior/Cognition: Alert;Cooperative;Pleasant mood Oral Cavity Assessment: Within Functional Limits Oral Care Completed by SLP: Recent completion by staff Oral Cavity - Dentition: Adequate natural dentition Vision: Functional for self-feeding Self-Feeding Abilities: Able to feed self;Needs set up Patient Positioning: Upright in bed Baseline Vocal Quality: Normal Volitional Cough: Strong Volitional Swallow: Able to elicit    Oral/Motor/Sensory Function Overall Oral Motor/Sensory Function: Within functional limits   Ice Chips Ice  chips: Not tested   Thin Liquid Thin Liquid: Within functional limits Presentation: Cup;Self Fed (7-8 trials)    Nectar Thick Nectar Thick Liquid: Not tested   Honey Thick Honey Thick Liquid: Not tested   Puree Puree: Within functional limits Presentation: Self Fed;Spoon (5-6 trials)   Solid   GO   Solid: Within functional limits (soft solids) Presentation: Self Fed;Spoon (2-3 trials)    Functional Assessment Tool Used: clinical judgement Functional Limitations: Swallowing Swallow Current Status [id number]): 0 percent impaired, limited or restricted Swallow Goal Status [id number]): 0 percent impaired, limited or restricted Swallow Discharge Status [id number]): 0 percent impaired, limited or restricted     Orinda Kenner, MS, CCC-SLP Ulisses Vondrak 06/28/2016,2:00 PM

## 2016-06-28 NOTE — Clinical Social Work Note (Signed)
MSW received consult that patient is from [facility], MSW contacted [facility] and they said they will pick patient up once discharge orders have been received.  MSW to sign off.  Jones Broom. Meshulem Onorato, MSW [phone]  Mon-Fri 8a-4:30p 06/28/2016 5:15 PM

## 2016-06-28 NOTE — Discharge Summary (Addendum)
[facility] NAME: Tammy Armstrong    MR#:  [id number]  DATE OF BIRTH:  Feb 28, 1951  DATE OF ADMISSION:  06/27/2016   ADMITTING PHYSICIAN: Harvie Bridge, DO  DATE OF DISCHARGE: 06/28/2016 PRIMARY CARE PHYSICIAN: ALDRIDGE,BARBARA, MD   ADMISSION DIAGNOSIS:  Right arm numbness [R20.2] Numbness and tingling of right side of face [R20.0, R20.2] Nonintractable episodic headache, unspecified headache type [R51] DISCHARGE DIAGNOSIS:  Active Problems:   TIA (transient ischemic attack)   Right upper extremity numbness  SECONDARY DIAGNOSIS:   Past Medical History:  Diagnosis Date  . Anxiety   . Arthritis   . Carotid artery occlusion   . Cigarette smoker one half pack a day or less   . COPD (chronic obstructive pulmonary disease) ([place]) 07/04/2011  . Depression   . Diabetes mellitus   . Diabetes mellitus 07/04/2011  . GERD (gastroesophageal reflux disease)   . Hepatitis C   . Hypercholesterolemia   . Hypertension   . Migraines   . Pancreatitis   . 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 Respiratory failure ([place]) 07/04/2011  . Seizure ([place]) 07/02/2011  . Shortness of breath dyspnea   . Stroke [facility])    HOSPITAL COURSE:   1. Right-sided weakness and impaired speech in the setting of prior CVA and carotid artery disease. There is concern for TIA, CVA but hypertensive emergency muscle becomes considered and the diagnosis given patient's headache and elevated blood pressure.  If neuro workup is negative would also consider cervical spine degenerative disc disease or DJD. The patient symptoms improved, she has no complaints. Echocardiogram is normal, carotid duplex showed right carotid artery stenosis with the 50-69% luminal narrowing range. The patient refused MRI brain and head CTA. After discussion with the patient she agreed to get a CTA which shows:  Status post LEFT carotid endarterectomy without residual stenosis or cuff  defect.  LEFT ICA pseudoaneurysm, 3 x 5 x 8 mm, without visible luminal compromise of the parent vessel or internal thrombus. This could however serve as a source of microemboli. Consider formal catheter angiogram or continued CTA surveillance as clinically indicated  Stable non stenotic calcified and soft plaque involving distal RIGHT CCA and proximal RIGHT ICA. Less than 50% stenosis not significantly progressed from 04/07/2015.  Follow-up vascular surgeon as outpatient per Dr. Doy Mince.  Continue aspirin 81mg . continue Plavix and Lipitor. PT evaluation suggests no PT follow-up.  2. Anemia, mild-stable. 3. History of COPD-continue albuterol, Symbicort, Breo Ellipta 4. History of CVA-continue aspirin and Plavix 5. History of hyperlipidemia-continue Lipitor 6. History of hypertension-hold Toprol and lisinopril for permissive hypertension and resume when stable. 7. History of diabetes-hold metformin and glipizide and cover with regular insulin sliding scale 8. History of GERD-continue Protonix 9. History of anxiety and depression-continue Cymbalta, Seroquel and Neurontin. trazodone, Geodon.  Hold Xanax for neuro checks.  Discussed with Dr. Doy Mince. DISCHARGE CONDITIONS:  Stable, discharge to group home today. CONSULTS OBTAINED:  Treatment Team:  Alexis Goodell, MD DRUG ALLERGIES:   Allergies  Allergen Reactions  . Promethazine Hcl Other (See Comments)    Reaction:  Impaired speech   . Ciprofloxacin Other (See Comments)    Medication is contraindicated with some of pts other meds.    . Doxycycline Nausea And Vomiting  . Imitrex [Sumatriptan Base] Other (See Comments)    Pt states that it "puts her out of this world".    . Morphine And Related Nausea And Vomiting  . Penicillins Nausea And  Vomiting and Other (See Comments)    Pt is unable to answer additional questions about this medication.    . Zantac [Ranitidine Hcl] Other (See Comments)    Reaction:  Unknown   .  Ketorolac Tromethamine Rash   DISCHARGE MEDICATIONS:     Medication List    TAKE these medications   albuterol 108 (90 Base) MCG/ACT inhaler Commonly known as:  PROVENTIL HFA;VENTOLIN HFA Inhale 2 puffs into the lungs every 6 (six) hours as needed for wheezing or shortness of breath.   ALPRAZolam 0.5 MG tablet Commonly known as:  XANAX Take 1 tablet 3 times daily for 4 days, then 1 tablet twice daily for 4 days, then 1 tablet daily for 4 days, then 1 tablet every other day until gone   aspirin 81 MG EC tablet Take 1 tablet (81 mg total) by mouth daily.   atorvastatin 40 MG tablet Commonly known as:  LIPITOR Take 1 tablet (40 mg total) by mouth daily at 6 PM. What changed:  when to take this   budesonide-formoterol 80-4.5 MCG/ACT inhaler Commonly known as:  SYMBICORT Inhale 2 puffs into the lungs 2 (two) times daily.   clopidogrel 75 MG tablet Commonly known as:  PLAVIX Take 1 tablet (75 mg total) by mouth daily with breakfast.   docusate sodium 100 MG capsule Commonly known as:  COLACE Take 100 mg by mouth daily.   DULoxetine 60 MG capsule Commonly known as:  CYMBALTA Take 60 mg by mouth 2 (two) times daily.   fluticasone furoate-vilanterol 100-25 MCG/INH Aepb Commonly known as:  BREO ELLIPTA Inhale 1 puff into the lungs daily.   gabapentin 300 MG capsule Commonly known as:  NEURONTIN Take 300 mg by mouth 4 (four) times daily.   glipiZIDE 5 MG 24 hr tablet Commonly known as:  GLUCOTROL XL Take 1 tablet (5 mg total) by mouth daily.   ibuprofen 200 MG tablet Commonly known as:  ADVIL,MOTRIN Take 200 mg by mouth every 6 (six) hours as needed for headache or moderate pain.   lisinopril 20 MG tablet Commonly known as:  PRINIVIL,ZESTRIL Take 1 tablet (20 mg total) by mouth daily.   meloxicam 7.5 MG tablet Commonly known as:  MOBIC Take 7.5 mg by mouth 2 (two) times daily.   metFORMIN 500 MG 24 hr tablet Commonly known as:  GLUCOPHAGE-XR Take 500 mg by mouth  2 (two) times daily.   metoprolol succinate 50 MG 24 hr tablet Commonly known as:  TOPROL-XL Take 50 mg by mouth daily.   ondansetron 4 MG tablet Commonly known as:  ZOFRAN Take 1 tablet (4 mg total) by mouth daily as needed for nausea or vomiting.   pantoprazole 40 MG tablet Commonly known as:  PROTONIX Take 40 mg by mouth 2 (two) times daily.   QUEtiapine 25 MG tablet Commonly known as:  SEROQUEL Take 25 mg by mouth at bedtime.   traZODone 50 MG tablet Commonly known as:  DESYREL Take 50 mg by mouth at bedtime.   ziprasidone 40 MG capsule Commonly known as:  GEODON Take 40 mg by mouth 2 (two) times daily with a meal.        DISCHARGE INSTRUCTIONS:  See AVS.  If you experience worsening of your admission symptoms, develop shortness of breath, life threatening emergency, suicidal or homicidal thoughts you must seek medical attention immediately by calling 911 or calling your MD immediately  if symptoms less severe.  You Must read complete instructions/literature along with all the possible  adverse reactions/side effects for all the Medicines you take and that have been prescribed to you. Take any new Medicines after you have completely understood and accpet all the possible adverse reactions/side effects.   Please note  You were cared for by a hospitalist during your hospital stay. If you have any questions about your discharge medications or the care you received while you were in the hospital after you are discharged, you can call the unit and asked to speak with the hospitalist on call if the hospitalist that took care of you is not available. Once you are discharged, your primary care physician will handle any further medical issues. Please note that NO REFILLS for any discharge medications will be authorized once you are discharged, as it is imperative that you return to your primary care physician (or establish a relationship with a primary care physician if you do not  have one) for your aftercare needs so that they can reassess your need for medications and monitor your lab values.    On the day of Discharge:  VITAL SIGNS:  Blood pressure (!) 159/66, pulse 78, temperature 98.4 F (36.9 C), temperature source Oral, resp. rate 16, height 5\' 2"  (1.575 m), weight 168 lb (76.2 kg), SpO2 99 %. PHYSICAL EXAMINATION:  GENERAL:  65 y.o.-year-old patient lying in the bed with no acute distress.  EYES: Pupils equal, round, reactive to light and accommodation. No scleral icterus. Extraocular muscles intact.  HEENT: Head atraumatic, normocephalic. Oropharynx and nasopharynx clear.  NECK:  Supple, no jugular venous distention. No thyroid enlargement, no tenderness.  LUNGS: Normal breath sounds bilaterally, no wheezing, rales,rhonchi or crepitation. No use of accessory muscles of respiration.  CARDIOVASCULAR: S1, S2 normal. No murmurs, rubs, or gallops.  ABDOMEN: Soft, non-tender, non-distended. Bowel sounds present. No organomegaly or mass.  EXTREMITIES: No pedal edema, cyanosis, or clubbing.  NEUROLOGIC: Cranial nerves II through XII are intact. Muscle strength 5/5 in all extremities. Sensation intact. Gait not checked.  PSYCHIATRIC: The patient is alert and oriented x 3.  SKIN: No obvious rash, lesion, or ulcer.  DATA REVIEW:   CBC  Recent Labs Lab 06/27/16 1734  WBC 6.7  HGB 10.8*  HCT 32.5*  PLT 229    Chemistries   Recent Labs Lab 06/27/16 1734  NA 140  K 3.8  CL 107  CO2 27  GLUCOSE 156*  BUN 22*  CREATININE 0.83  CALCIUM 9.0  AST 25  ALT 19  ALKPHOS 72  BILITOT 0.2*     Microbiology Results  Results for orders placed or performed during the hospital encounter of 06/27/16  MRSA PCR Screening     Status: Abnormal   Collection Time: 06/28/16  1:01 AM  Result Value Ref Range Status   MRSA by PCR (A) NEGATIVE Final    INVALID, UNABLE TO DETERMINE THE PRESENCE OF TARGET DNA DUE TO SPECIMEN INTEGRITY. RECOLLECTION REQUESTED.     Comment:        The GeneXpert MRSA Assay (FDA approved for NASAL specimens only), is one component of a comprehensive MRSA colonization surveillance program. It is not intended to diagnose MRSA infection nor to guide or monitor treatment for MRSA infections. REQUEST FOR RECOLLECT CALLED TO CYNTHIA BURGESS AT [id number] ON 06/28/16 [place].   MRSA PCR Screening     Status: None   Collection Time: 06/28/16  6:00 AM  Result Value Ref Range Status   MRSA by PCR NEGATIVE NEGATIVE Final    Comment:  The GeneXpert MRSA Assay (FDA approved for NASAL specimens only), is one component of a comprehensive MRSA colonization surveillance program. It is not intended to diagnose MRSA infection nor to guide or monitor treatment for MRSA infections.     RADIOLOGY:  Ct Angio Head W Or Wo Contrast  Result Date: 06/28/2016 CLINICAL DATA:  Sudden onset of numbness and tingling of the RIGHT face, RIGHT arm and RIGHT hand with weakness. This occurred earlier today. Symptoms have now resolved. Stroke risk factors include hypertension, diabetes, and hypercholesterolemia. EXAM: CT ANGIOGRAPHY HEAD AND NECK TECHNIQUE: Multidetector CT imaging of the head and neck was performed using the standard protocol during bolus administration of intravenous contrast. Multiplanar CT image reconstructions and MIPs were obtained to evaluate the vascular anatomy. Carotid stenosis measurements (when applicable) are obtained utilizing NASCET criteria, using the distal internal carotid diameter as the denominator. CONTRAST:  Isovue 370, 75 mL. COMPARISON:  Carotid ultrasound 06/28/2016. CT head 06/27/2016. CT head 04/29/2015. CTA head neck 04/07/2015. FINDINGS: CT HEAD Calvarium and skull base: No fracture or destructive lesion. Mastoids and middle ears are grossly clear. Paranasal sinuses: Imaged portions are clear. Orbits: Negative. Brain: No evidence of acute abnormality, including acute infarct, hemorrhage, hydrocephalus, or mass  lesion. Mild atrophy and chronic microvascular ischemic change, stable. No features suggestive of acute infarction. 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 Heavily calcified LEFT frontal convexity mass, approximately 9 x 10 mm cross-section, consistent with meningioma. CTA NECK Aortic arch: Normal variant branching with LEFT vertebral originating directly from the arch. The Imaged portion shows no evidence of aneurysm or dissection. No significant stenosis of the major arch vessel origins. Right carotid system: Extensive soft plaque involving the common carotid artery in its distal third, contributing to mild luminal narrowing. Calcific and soft plaque extends from the bifurcation 1.5 cm into the RIGHT internal carotid artery. Luminal measurements of 2.7/3.9 proximal/ distal medial to no significant stenosis. No dissection or distal occlusion. Left carotid system: Since the prior CTA, the patient has undergone carotid endarterectomy. No residual stenosis at the bifurcation. In the mid cervical ICA, there is a pseudoaneurysm, measuring 3 x 5 x 8 mm, 8 mm being craniocaudal extent, without visible internal thrombus. No significant narrowing of the cervical ICA. Vertebral arteries: Codominant. No evidence of dissection, stenosis (50% or greater) or occlusion. Nonvascular soft tissues. No neck mass. COPD. Spondylosis. Poor dentition. Review of the MIP images confirms the above findings. CTA HEAD Anterior circulation: Calcified non stenotic BILATERAL cavernous carotid arteries. No evidence for intracranial dissection or emboli to the LEFT anterior circulation. No significant stenosis, proximal occlusion, aneurysm, or vascular malformation. Posterior circulation: Codominant vertebral arteries. Basilar artery widely patent. No significant stenosis, proximal occlusion, aneurysm, or vascular malformation.  Venous sinuses: Patent. Anatomic variants: None of significance. Delayed phase:   No abnormal intracranial enhancement. Review of the MIP images confirms the above findings. IMPRESSION: Status post LEFT carotid endarterectomy without residual stenosis or cuff defect. LEFT ICA pseudoaneurysm, 3 x 5 x 8 mm, without visible luminal compromise of the parent vessel or internal thrombus. This could however serve as a source of microemboli. Consider formal catheter angiogram or continued CTA surveillance as clinically indicated Stable non stenotic calcified and soft plaque involving distal RIGHT CCA and proximal RIGHT ICA. Less than 50% stenosis not significantly progressed from 04/07/2015. Electronically Signed   By: Staci Righter M.D.   On: 06/28/2016 15:48   Ct Head  Wo Contrast  Result Date: 06/27/2016 CLINICAL DATA:  Right arm pain and numbness. EXAM: CT HEAD WITHOUT CONTRAST TECHNIQUE: Contiguous axial images were obtained from the base of the skull through the vertex without intravenous contrast. COMPARISON:  04/29/2015 FINDINGS: Brain: No evidence of acute infarction, hemorrhage, hydrocephalus, extra-axial collection or mass lesion/mass effect. Calcification within the mid pons is again noted. 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 Mild low attenuation within the subcortical and periventricular white matter noted compatible with chronic microvascular disease. No evidence for acute brain infarct, intracranial hemorrhage or mass. Vascular: No hyperdense vessel or unexpected calcification. Skull: Normal. Negative for fracture or focal lesion. Sinuses/Orbits: The paranasal sinuses and mastoid air cells are clear. The calvarium is intact. Other: None. IMPRESSION: 1. No acute intracranial abnormality identified. 2. Chronic changes as described above. Electronically Signed   By: Kerby Moors M.D.   On: 06/27/2016 17:59   Ct Angio Neck W Or Wo Contrast  Result  Date: 06/28/2016 CLINICAL DATA:  Sudden onset of numbness and tingling of the RIGHT face, RIGHT arm and RIGHT hand with weakness. This occurred earlier today. Symptoms have now resolved. Stroke risk factors include hypertension, diabetes, and hypercholesterolemia. EXAM: CT ANGIOGRAPHY HEAD AND NECK TECHNIQUE: Multidetector CT imaging of the head and neck was performed using the standard protocol during bolus administration of intravenous contrast. Multiplanar CT image reconstructions and MIPs were obtained to evaluate the vascular anatomy. Carotid stenosis measurements (when applicable) are obtained utilizing NASCET criteria, using the distal internal carotid diameter as the denominator. CONTRAST:  Isovue 370, 75 mL. COMPARISON:  Carotid ultrasound 06/28/2016. CT head 06/27/2016. CT head 04/29/2015. CTA head neck 04/07/2015. FINDINGS: CT HEAD Calvarium and skull base: No fracture or destructive lesion. Mastoids and middle ears are grossly clear. Paranasal sinuses: Imaged portions are clear. Orbits: Negative. Brain: No evidence of acute abnormality, including acute infarct, hemorrhage, hydrocephalus, or mass lesion. Mild atrophy and chronic microvascular ischemic change, stable. No features suggestive of acute infarction. 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 Heavily calcified LEFT frontal convexity mass, approximately 9 x 10 mm cross-section, consistent with meningioma. CTA NECK Aortic arch: Normal variant branching with LEFT vertebral originating directly from the arch. The Imaged portion shows no evidence of aneurysm or dissection. No significant stenosis of the major arch vessel origins. Right carotid system: Extensive soft plaque involving the common carotid artery in its distal third, contributing to mild luminal narrowing. Calcific and soft plaque extends from the bifurcation 1.5 cm into  the RIGHT internal carotid artery. Luminal measurements of 2.7/3.9 proximal/ distal medial to no significant stenosis. No dissection or distal occlusion. Left carotid system: Since the prior CTA, the patient has undergone carotid endarterectomy. No residual stenosis at the bifurcation. In the mid cervical ICA, there is a pseudoaneurysm, measuring 3 x 5 x 8 mm, 8 mm being craniocaudal extent, without visible internal thrombus. No significant narrowing of the cervical ICA. Vertebral arteries: Codominant. No evidence of dissection, stenosis (50% or greater) or occlusion. Nonvascular soft tissues. No neck mass. COPD. Spondylosis. Poor dentition. Review of the MIP images confirms the above findings. CTA HEAD Anterior circulation: Calcified non stenotic BILATERAL cavernous carotid arteries. No evidence for intracranial dissection or emboli to the LEFT anterior circulation. No significant stenosis, proximal occlusion, aneurysm, or vascular malformation. Posterior circulation: Codominant vertebral arteries. Basilar artery widely patent. No significant stenosis, proximal occlusion, aneurysm, or vascular  malformation. Venous sinuses: Patent. Anatomic variants: None of significance. Delayed phase:   No abnormal intracranial enhancement. Review of the MIP images confirms the above findings. IMPRESSION: Status post LEFT carotid endarterectomy without residual stenosis or cuff defect. LEFT ICA pseudoaneurysm, 3 x 5 x 8 mm, without visible luminal compromise of the parent vessel or internal thrombus. This could however serve as a source of microemboli. Consider formal catheter angiogram or continued CTA surveillance as clinically indicated Stable non stenotic calcified and soft plaque involving distal RIGHT CCA and proximal RIGHT ICA. Less than 50% stenosis not significantly progressed from 04/07/2015. Electronically Signed   By: Staci Righter M.D.   On: 06/28/2016 15:48   US Carotid Bilateral (at [facility] Only)  Result  Date: 06/28/2016 CLINICAL DATA:  TIA/stroke. History of left-sided carotid stent. History of hypertension, hyperlipidemia, diabetes and smoking. EXAM: BILATERAL CAROTID DUPLEX ULTRASOUND TECHNIQUE: Pearline Cables scale imaging, color Doppler and duplex ultrasound were performed of bilateral carotid and vertebral arteries in the neck. COMPARISON:  None. FINDINGS: Criteria: Quantification of carotid stenosis is based on velocity parameters that correlate the residual internal carotid diameter with NASCET-based stenosis levels, using the diameter of the distal internal carotid lumen as the denominator for stenosis measurement. The following velocity measurements were obtained: RIGHT ICA:  142/41 cm/sec CCA:  123456 cm/sec SYSTOLIC ICA/CCA RATIO:  1.5 DIASTOLIC ICA/CCA RATIO:  1.7 ECA:  153 cm/sec LEFT ICA:  213/75 cm/sec CCA:  123XX123 cm/sec SYSTOLIC ICA/CCA RATIO:  1.7 DIASTOLIC ICA/CCA RATIO:  2.9 ECA:  176 cm/sec RIGHT CAROTID ARTERY: There is a minimal amount of eccentric mixed echogenic plaque within distal aspect the right common carotid artery (image 11). There is a moderate amount of eccentric mixed echogenic partially shadowing plaque within the right carotid bulb (images 14 and 15), extending to involve the origin and proximal aspect the right internal carotid artery (image 22) which results in elevated peak systolic velocities throughout the interrogated course the right internal carotid artery (greatest acquired peak systolic velocity within mid ICA measures 142 cm/sec - image 27). RIGHT VERTEBRAL ARTERY:  Antegrade Flow LEFT CAROTID ARTERY: Post stenting of the left carotid bulb. There is a minimal to moderate amount of in-stent intimal wall thickening within the left carotid bulb (representative image 46). There are residual/recurrent elevated peak systolic velocities within the left internal carotid artery (greatest acquired peak systolic velocity within the distal aspect of the left ICA measures 213 cm/sec - image  63). LEFT VERTEBRAL ARTERY:  Antegrade flow IMPRESSION: 1. Post left carotid bulb stenting with in-stent intimal wall thickening which results in elevated peak systolic velocities within the left internal carotid artery compatible with the higher end of the 50-69% luminal narrowing range. Further evaluation with CTA could performed as clinically indicated. 2. Minimal to moderate amount of right-sided atherosclerotic plaque results in elevated peak systolic velocities within the right internal carotid artery compatible with the 50-69% luminal narrowing range. Electronically Signed   By: Sandi Mariscal M.D.   On: 06/28/2016 09:53     Management plans discussed with the patient, family and they are in agreement.  CODE STATUS:     Code Status Orders        Start     Ordered   06/28/16 0023  Full code  Continuous     06/28/16 0022    Code Status History    Date Active Date Inactive Code Status Order ID Comments User Context   05/18/2015  3:07 PM 05/19/2015  5:16 PM Full  Code [id number]  Algernon Huxley, MD Inpatient   04/06/2015  9:23 PM 04/08/2015  5:02 PM Full Code [id number]  Bettey Costa, MD Inpatient      TOTAL TIME TAKING CARE OF THIS PATIENT: 38 minutes.    Demetrios Loll M.D on 06/28/2016 at 4:05 PM  Between 7am to 6pm - Pager - [phone]  After 6pm go to [email or web address] - Proofreader  [facility]  Office  [phone]  CC: Primary care physician; Gayland Curry, MD   Note: This dictation was prepared with Dragon dictation along with smaller phrase technology. Any transcriptional errors that result from this process are unintentional.

## 2016-06-28 NOTE — Progress Notes (Signed)
Discharge paperwork reviewed with patient who verbalized understanding. No medication changes this admission. Follow-up appointments reviewed. [facility] to transport home- contacted, waiting for arrival.

## 2016-06-28 NOTE — Discharge Instructions (Signed)
Heart healthy and ADA diet. The patient needs to follow-up vascular surgeon as outpatient.

## 2016-06-28 NOTE — Progress Notes (Signed)
*  PRELIMINARY RESULTS* Echocardiogram 2D Echocardiogram has been performed.  Tammy Armstrong 06/28/2016, 1:03 PM

## 2016-06-29 LAB — HEMOGLOBIN A1C
HEMOGLOBIN A1C: 6.9 % — AB (ref 4.8–5.6)
Mean Plasma Glucose: 151 mg/dL

## 2016-07-13 ENCOUNTER — Encounter (INDEPENDENT_AMBULATORY_CARE_PROVIDER_SITE_OTHER): Payer: Self-pay | Admitting: Vascular Surgery

## 2016-07-13 ENCOUNTER — Ambulatory Visit (INDEPENDENT_AMBULATORY_CARE_PROVIDER_SITE_OTHER): Payer: Medicare Other | Admitting: Vascular Surgery

## 2016-07-13 VITALS — BP 122/64 | HR 60 | Resp 15 | Ht 62.0 in | Wt 171.0 lb

## 2016-07-13 DIAGNOSIS — E118 Type 2 diabetes mellitus with unspecified complications: Secondary | ICD-10-CM

## 2016-07-13 DIAGNOSIS — I720 Aneurysm of carotid artery: Secondary | ICD-10-CM | POA: Diagnosis not present

## 2016-07-13 DIAGNOSIS — G459 Transient cerebral ischemic attack, unspecified: Secondary | ICD-10-CM | POA: Diagnosis not present

## 2016-07-13 DIAGNOSIS — I6523 Occlusion and stenosis of bilateral carotid arteries: Secondary | ICD-10-CM

## 2016-07-13 DIAGNOSIS — F17200 Nicotine dependence, unspecified, uncomplicated: Secondary | ICD-10-CM | POA: Diagnosis not present

## 2016-07-13 NOTE — Progress Notes (Signed)
MRN : [id number]  Tammy Armstrong is a 65 y.o. (1951/06/19) female who presents with chief complaint of  Chief Complaint  Patient presents with  . Re-evaluation    2 week follow up  .  History of Present Illness: Patient returns after recent hospitalization for right arm weakness and pain. This has improved and she has no current symptoms. She had a CT scan performed which I have independently reviewed. This demonstrates a widely patent left carotid endarterectomy. This was performed last year. 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 This was not present prior to her endarterectomy. She also has some carotid stenosis on the right although this appears to be less than 50%. A bulky plaque is seen. She remains on aspirin. She says she has not smoked in 2 days but has been smoking until recently.  Current Outpatient Prescriptions  Medication Sig Dispense Refill  . albuterol (PROVENTIL HFA;VENTOLIN HFA) 108 (90 BASE) MCG/ACT inhaler Inhale 2 puffs into the lungs every 6 (six) hours as needed for wheezing or shortness of breath.     . ALPRAZolam (XANAX) 0.5 MG tablet Take 1 tablet 3 times daily for 4 days, then 1 tablet twice daily for 4 days, then 1 tablet daily for 4 days, then 1 tablet every other day until gone 30 tablet 0  . aspirin EC 81 MG EC tablet Take 1 tablet (81 mg total) by mouth daily. 90 tablet 3  . atorvastatin (LIPITOR) 40 MG tablet Take 1 tablet (40 mg total) by mouth daily at 6 PM. (Patient taking differently: Take 40 mg by mouth daily. ) 30 tablet 2  . budesonide-formoterol (SYMBICORT) 80-4.5 MCG/ACT inhaler Inhale 2 puffs into the lungs 2 (two) times daily. 1 Inhaler 2  . busPIRone (BUSPAR) 10 MG tablet Take by mouth.    . clopidogrel (PLAVIX) 75 MG tablet Take 1 tablet (75 mg total) by mouth daily with breakfast. 30 tablet 3  . docusate sodium (COLACE) 100 MG capsule Take 100 mg by mouth  daily.     . DULoxetine (CYMBALTA) 60 MG capsule Take 60 mg by mouth 2 (two) times daily.    . fluticasone furoate-vilanterol (BREO ELLIPTA) 100-25 MCG/INH AEPB Inhale 1 puff into the lungs daily.    Marland Kitchen gabapentin (NEURONTIN) 300 MG capsule Take 300 mg by mouth 4 (four) times daily.    Marland Kitchen glipiZIDE (GLUCOTROL XL) 5 MG 24 hr tablet Take 1 tablet (5 mg total) by mouth daily. 30 tablet 2  . ibuprofen (ADVIL,MOTRIN) 200 MG tablet Take 200 mg by mouth every 6 (six) hours as needed for headache or moderate pain.    Marland Kitchen lisinopril (PRINIVIL,ZESTRIL) 20 MG tablet Take 1 tablet (20 mg total) by mouth daily. 30 tablet 2  . meloxicam (MOBIC) 7.5 MG tablet Take 7.5 mg by mouth 2 (two) times daily.    . metFORMIN (GLUCOPHAGE-XR) 500 MG 24 hr tablet Take 500 mg by mouth 2 (two) times daily.    . metoprolol (TOPROL-XL) 50 MG 24 hr tablet Take 50 mg by mouth daily.      . ondansetron (ZOFRAN) 4 MG tablet Take 1 tablet (4 mg total) by mouth daily as needed for nausea or vomiting. 20 tablet 1  . pantoprazole (PROTONIX) 40 MG tablet Take 40 mg by mouth 2 (two) times daily.    . QUEtiapine (SEROQUEL) 25 MG tablet Take 25 mg by mouth at bedtime.    Marland Kitchen  traZODone (DESYREL) 50 MG tablet Take 50 mg by mouth at bedtime.    . ziprasidone (GEODON) 40 MG capsule Take 40 mg by mouth 2 (two) times daily with a meal.      No current facility-administered medications for this visit.     Past Medical History:  Diagnosis Date  . Anxiety   . Arthritis   . Carotid artery occlusion   . Cigarette smoker one half pack a day or less   . COPD (chronic obstructive pulmonary disease) ([place]) 07/04/2011  . Depression   . Diabetes mellitus   . Diabetes mellitus 07/04/2011  . GERD (gastroesophageal reflux disease)   . Hepatitis C   . Hypercholesterolemia   . Hypertension   . Migraines   . Pancreatitis   . 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 Respiratory failure ([place]) 07/04/2011  . Seizure ([place]) 07/02/2011  . Shortness of breath  dyspnea   . Stroke [facility])     Past Surgical History:  Procedure Laterality Date  . ABDOMINAL HYSTERECTOMY    . APPENDECTOMY    . BACK SURGERY  2012   cyst removed from spine ,   . CHOLECYSTECTOMY    . ENDARTERECTOMY Left 05/18/2015   Procedure: ENDARTERECTOMY CAROTID;  Surgeon: Algernon Huxley, MD;  Location: [facility] ORS;  Service: Vascular;  Laterality: Left;    Social History Social History  Substance Use Topics  . Smoking status: Current Every Day Smoker    Packs/day: 1.00    Types: Cigarettes  . Smokeless tobacco: Never Used  . Alcohol use No  Lives in a group home  Family History Family History  Problem Relation Age of Onset  . Hypertension Mother   . Heart attack Mother   . Diabetes Mother   . Anxiety disorder Mother   . Stroke Mother   . Diabetes Father   . Diabetes Brother   . Alcohol abuse      siblings  . Hypertension Brother     Allergies  Allergen Reactions  . Promethazine Hcl Other (See Comments)    Reaction:  Impaired speech   . Ciprofloxacin Other (See Comments)    Medication is contraindicated with some of pts other meds.    . Doxycycline Nausea And Vomiting  . Imitrex [Sumatriptan Base] Other (See Comments)    Pt states that it "puts her out of this world".    . Morphine And Related Nausea And Vomiting  . Penicillins Nausea And Vomiting and Other (See Comments)    Pt is unable to answer additional questions about this medication.    . Zantac [Ranitidine Hcl] Other (See Comments)    Reaction:  Unknown   . Ketorolac Tromethamine Rash     REVIEW OF SYSTEMS (Negative unless checked)  Constitutional: [] Weight loss  [] Fever  [] Chills Cardiac: [] Chest pain   [] Chest pressure   [] Palpitations   [] Shortness of breath when laying flat   [] Shortness of breath at rest   [] Shortness of breath with exertion. Vascular:  [] Pain in legs with walking   [] Pain in legs at rest   [] Pain in legs when laying flat   [] Claudication   [] Pain in feet when walking   [] Pain in feet at rest  [] Pain in feet when laying flat   [] History of DVT   [] Phlebitis   [] Swelling in legs   [] Varicose veins   [] Non-healing ulcers Pulmonary:   [] Uses home oxygen   [] Productive cough   [] Hemoptysis   [] Wheeze  [] COPD   [] Asthma Neurologic:  []   Dizziness  [x] Blackouts   [] Seizures   [] History of stroke   [x] History of TIA  [] Aphasia   [] Temporary blindness   [] Dysphagia   [] Weakness or numbness in arms   [] Weakness or numbness in legs Musculoskeletal:  [] Arthritis   [] Joint swelling   [] Joint pain   [] Low back pain Hematologic:  [] Easy bruising  [] Easy bleeding   [] Hypercoagulable state   [] Anemic  [] Hepatitis Gastrointestinal:  [] Blood in stool   [] Vomiting blood  [] Gastroesophageal reflux/heartburn   [] Difficulty swallowing. Genitourinary:  [] Chronic kidney disease   [] Difficult urination  [] Frequent urination  [] Burning with urination   [] Blood in urine Skin:  [] Rashes   [] Ulcers   [] Wounds Psychological:  [x] History of anxiety   [x]  History of major depression.  Physical Examination  Vitals:   07/13/16 1057  BP: 122/64  Pulse: 60  Resp: 15  Weight: 77.6 kg (171 lb)  Height: 5\' 2"  (1.575 m)   Body mass index is 31.28 kg/m. Gen:  WD/WN, NAD Head: [place]/AT, No temporalis wasting. Ear/Nose/Throat: Hearing grossly intact, nares w/o erythema or drainage, trachea midline Eyes: Conjunctiva clear. Sclera non-icteric Neck: Supple.  No bruit or JVD.  Pulmonary:  Good air movement, equal and clear to auscultation bilaterally.  Cardiac: RRR, normal S1, S2, no Murmurs, rubs or gallops. Vascular:  Vessel Right Left  Radial Palpable Palpable                                   Gastrointestinal: soft, non-tender/non-distended. No guarding/reflex.  Musculoskeletal: M/S 5/5 throughout.  No deformity or atrophy. No edema. Neurologic: CN 2-12 intact. Sensation grossly intact in extremities.  Symmetrical.  Speech is fluent. Motor exam as listed above. Psychiatric: Flat  affect. And seems fair. Dermatologic: No rashes or ulcers noted.  No cellulitis or open wounds. Lymph : No Cervical, Axillary, or Inguinal lymphadenopathy.     CBC Lab Results  Component Value Date   WBC 6.7 06/27/2016   HGB 10.8 (L) 06/27/2016   HCT 32.5 (L) 06/27/2016   MCV 82.9 06/27/2016   PLT 229 06/27/2016    BMET    Component Value Date/Time   NA 140 06/27/2016 1734   NA 140 07/11/2014 0546   K 3.8 06/27/2016 1734   K 3.5 07/11/2014 0546   CL 107 06/27/2016 1734   CL 104 07/11/2014 0546   CO2 27 06/27/2016 1734   CO2 29 07/11/2014 0546   GLUCOSE 156 (H) 06/27/2016 1734   GLUCOSE 81 07/11/2014 0546   BUN 22 (H) 06/27/2016 1734   BUN 17 07/11/2014 0546   CREATININE 0.83 06/27/2016 1734   CREATININE 0.88 07/11/2014 0546   CALCIUM 9.0 06/27/2016 1734   CALCIUM 8.5 07/11/2014 0546   GFRNONAA >60 06/27/2016 1734   GFRNONAA >60 07/11/2014 0546   GFRNONAA >60 02/12/2012 1601   GFRAA >60 06/27/2016 1734   GFRAA >60 07/11/2014 0546   GFRAA >60 02/12/2012 1601   Estimated Creatinine Clearance: 66 mL/min (by C-G formula based on SCr of 0.83 mg/dL).  COAG Lab Results  Component Value Date   INR 0.98 06/27/2016   INR 1.03 05/13/2015   INR 1.02 04/06/2015    Radiology Ct Angio Head W Or Wo Contrast  Result Date: 06/28/2016 CLINICAL DATA:  Sudden onset of numbness and tingling of the RIGHT face, RIGHT arm and RIGHT hand with weakness. This occurred earlier today. Symptoms have now resolved. Stroke risk factors include hypertension, diabetes, and  hypercholesterolemia. EXAM: CT ANGIOGRAPHY HEAD AND NECK TECHNIQUE: Multidetector CT imaging of the head and neck was performed using the standard protocol during bolus administration of intravenous contrast. Multiplanar CT image reconstructions and MIPs were obtained to evaluate the vascular anatomy. Carotid stenosis measurements (when applicable) are obtained utilizing NASCET criteria, using the distal internal carotid  diameter as the denominator. CONTRAST:  Isovue 370, 75 mL. COMPARISON:  Carotid ultrasound 06/28/2016. CT head 06/27/2016. CT head 04/29/2015. CTA head neck 04/07/2015. FINDINGS: CT HEAD Calvarium and skull base: No fracture or destructive lesion. Mastoids and middle ears are grossly clear. Paranasal sinuses: Imaged portions are clear. Orbits: Negative. Brain: No evidence of acute abnormality, including acute infarct, hemorrhage, hydrocephalus, or mass lesion. Mild atrophy and chronic microvascular ischemic change, stable. No features suggestive of acute infarction. 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 Heavily calcified LEFT frontal convexity mass, approximately 9 x 10 mm cross-section, consistent with meningioma. CTA NECK Aortic arch: Normal variant branching with LEFT vertebral originating directly from the arch. The Imaged portion shows no evidence of aneurysm or dissection. No significant stenosis of the major arch vessel origins. Right carotid system: Extensive soft plaque involving the common carotid artery in its distal third, contributing to mild luminal narrowing. Calcific and soft plaque extends from the bifurcation 1.5 cm into the RIGHT internal carotid artery. Luminal measurements of 2.7/3.9 proximal/ distal medial to no significant stenosis. No dissection or distal occlusion. Left carotid system: Since the prior CTA, the patient has undergone carotid endarterectomy. No residual stenosis at the bifurcation. In the mid cervical ICA, there is a pseudoaneurysm, measuring 3 x 5 x 8 mm, 8 mm being craniocaudal extent, without visible internal thrombus. No significant narrowing of the cervical ICA. Vertebral arteries: Codominant. No evidence of dissection, stenosis (50% or greater) or occlusion. Nonvascular soft tissues. No neck mass. COPD. Spondylosis. Poor dentition. Review of the MIP  images confirms the above findings. CTA HEAD Anterior circulation: Calcified non stenotic BILATERAL cavernous carotid arteries. No evidence for intracranial dissection or emboli to the LEFT anterior circulation. No significant stenosis, proximal occlusion, aneurysm, or vascular malformation. Posterior circulation: Codominant vertebral arteries. Basilar artery widely patent. No significant stenosis, proximal occlusion, aneurysm, or vascular malformation. Venous sinuses: Patent. Anatomic variants: None of significance. Delayed phase:   No abnormal intracranial enhancement. Review of the MIP images confirms the above findings. IMPRESSION: Status post LEFT carotid endarterectomy without residual stenosis or cuff defect. LEFT ICA pseudoaneurysm, 3 x 5 x 8 mm, without visible luminal compromise of the parent vessel or internal thrombus. This could however serve as a source of microemboli. Consider formal catheter angiogram or continued CTA surveillance as clinically indicated Stable non stenotic calcified and soft plaque involving distal RIGHT CCA and proximal RIGHT ICA. Less than 50% stenosis not significantly progressed from 04/07/2015. Electronically Signed   By: Staci Righter M.D.   On: 06/28/2016 15:48   Ct Head Wo Contrast  Result Date: 06/27/2016 CLINICAL DATA:  Right arm pain and numbness. EXAM: CT HEAD WITHOUT CONTRAST TECHNIQUE: Contiguous axial images were obtained from the base of the skull through the vertex without intravenous contrast. COMPARISON:  04/29/2015 FINDINGS: Brain: No evidence of acute infarction, hemorrhage, hydrocephalus, extra-axial collection or mass lesion/mass effect. Calcification within the mid pons is again noted. 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 Mild low attenuation within the subcortical and periventricular white matter noted compatible  with chronic microvascular disease. No evidence for acute brain infarct, intracranial  hemorrhage or mass. Vascular: No hyperdense vessel or unexpected calcification. Skull: Normal. Negative for fracture or focal lesion. Sinuses/Orbits: The paranasal sinuses and mastoid air cells are clear. The calvarium is intact. Other: None. IMPRESSION: 1. No acute intracranial abnormality identified. 2. Chronic changes as described above. Electronically Signed   By: Kerby Moors M.D.   On: 06/27/2016 17:59   Ct Angio Neck W Or Wo Contrast  Result Date: 06/28/2016 CLINICAL DATA:  Sudden onset of numbness and tingling of the RIGHT face, RIGHT arm and RIGHT hand with weakness. This occurred earlier today. Symptoms have now resolved. Stroke risk factors include hypertension, diabetes, and hypercholesterolemia. EXAM: CT ANGIOGRAPHY HEAD AND NECK TECHNIQUE: Multidetector CT imaging of the head and neck was performed using the standard protocol during bolus administration of intravenous contrast. Multiplanar CT image reconstructions and MIPs were obtained to evaluate the vascular anatomy. Carotid stenosis measurements (when applicable) are obtained utilizing NASCET criteria, using the distal internal carotid diameter as the denominator. CONTRAST:  Isovue 370, 75 mL. COMPARISON:  Carotid ultrasound 06/28/2016. CT head 06/27/2016. CT head 04/29/2015. CTA head neck 04/07/2015. FINDINGS: CT HEAD Calvarium and skull base: No fracture or destructive lesion. Mastoids and middle ears are grossly clear. Paranasal sinuses: Imaged portions are clear. Orbits: Negative. Brain: No evidence of acute abnormality, including acute infarct, hemorrhage, hydrocephalus, or mass lesion. Mild atrophy and chronic microvascular ischemic change, stable. No features suggestive of acute infarction. 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 Heavily calcified LEFT frontal convexity mass, approximately 9 x 10 mm cross-section,  consistent with meningioma. CTA NECK Aortic arch: Normal variant branching with LEFT vertebral originating directly from the arch. The Imaged portion shows no evidence of aneurysm or dissection. No significant stenosis of the major arch vessel origins. Right carotid system: Extensive soft plaque involving the common carotid artery in its distal third, contributing to mild luminal narrowing. Calcific and soft plaque extends from the bifurcation 1.5 cm into the RIGHT internal carotid artery. Luminal measurements of 2.7/3.9 proximal/ distal medial to no significant stenosis. No dissection or distal occlusion. Left carotid system: Since the prior CTA, the patient has undergone carotid endarterectomy. No residual stenosis at the bifurcation. In the mid cervical ICA, there is a pseudoaneurysm, measuring 3 x 5 x 8 mm, 8 mm being craniocaudal extent, without visible internal thrombus. No significant narrowing of the cervical ICA. Vertebral arteries: Codominant. No evidence of dissection, stenosis (50% or greater) or occlusion. Nonvascular soft tissues. No neck mass. COPD. Spondylosis. Poor dentition. Review of the MIP images confirms the above findings. CTA HEAD Anterior circulation: Calcified non stenotic BILATERAL cavernous carotid arteries. No evidence for intracranial dissection or emboli to the LEFT anterior circulation. No significant stenosis, proximal occlusion, aneurysm, or vascular malformation. Posterior circulation: Codominant vertebral arteries. Basilar artery widely patent. No significant stenosis, proximal occlusion, aneurysm, or vascular malformation. Venous sinuses: Patent. Anatomic variants: None of significance. Delayed phase:   No abnormal intracranial enhancement. Review of the MIP images confirms the above findings. IMPRESSION: Status post LEFT carotid endarterectomy without residual stenosis or cuff defect. LEFT ICA pseudoaneurysm, 3 x 5 x 8 mm, without visible luminal compromise of the parent vessel  or internal thrombus. This could however serve as a source of microemboli. Consider formal catheter angiogram or continued CTA surveillance as clinically indicated Stable non stenotic calcified and soft plaque involving distal RIGHT  CCA and proximal RIGHT ICA. Less than 50% stenosis not significantly progressed from 04/07/2015. Electronically Signed   By: Staci Righter M.D.   On: 06/28/2016 15:48   US Carotid Bilateral (at [facility] Only)  Result Date: 06/28/2016 CLINICAL DATA:  TIA/stroke. History of left-sided carotid stent. History of hypertension, hyperlipidemia, diabetes and smoking. EXAM: BILATERAL CAROTID DUPLEX ULTRASOUND TECHNIQUE: Pearline Cables scale imaging, color Doppler and duplex ultrasound were performed of bilateral carotid and vertebral arteries in the neck. COMPARISON:  None. FINDINGS: Criteria: Quantification of carotid stenosis is based on velocity parameters that correlate the residual internal carotid diameter with NASCET-based stenosis levels, using the diameter of the distal internal carotid lumen as the denominator for stenosis measurement. The following velocity measurements were obtained: RIGHT ICA:  142/41 cm/sec CCA:  123456 cm/sec SYSTOLIC ICA/CCA RATIO:  1.5 DIASTOLIC ICA/CCA RATIO:  1.7 ECA:  153 cm/sec LEFT ICA:  213/75 cm/sec CCA:  123XX123 cm/sec SYSTOLIC ICA/CCA RATIO:  1.7 DIASTOLIC ICA/CCA RATIO:  2.9 ECA:  176 cm/sec RIGHT CAROTID ARTERY: There is a minimal amount of eccentric mixed echogenic plaque within distal aspect the right common carotid artery (image 11). There is a moderate amount of eccentric mixed echogenic partially shadowing plaque within the right carotid bulb (images 14 and 15), extending to involve the origin and proximal aspect the right internal carotid artery (image 22) which results in elevated peak systolic velocities throughout the interrogated course the right internal carotid artery (greatest acquired peak systolic velocity within mid ICA measures 142 cm/sec -  image 27). RIGHT VERTEBRAL ARTERY:  Antegrade Flow LEFT CAROTID ARTERY: Post stenting of the left carotid bulb. There is a minimal to moderate amount of in-stent intimal wall thickening within the left carotid bulb (representative image 46). There are residual/recurrent elevated peak systolic velocities within the left internal carotid artery (greatest acquired peak systolic velocity within the distal aspect of the left ICA measures 213 cm/sec - image 63). LEFT VERTEBRAL ARTERY:  Antegrade flow IMPRESSION: 1. Post left carotid bulb stenting with in-stent intimal wall thickening which results in elevated peak systolic velocities within the left internal carotid artery compatible with the higher end of the 50-69% luminal narrowing range. Further evaluation with CTA could performed as clinically indicated. 2. Minimal to moderate amount of right-sided atherosclerotic plaque results in elevated peak systolic velocities within the right internal carotid artery compatible with the 50-69% luminal narrowing range. Electronically Signed   By: Sandi Mariscal M.D.   On: 06/28/2016 09:53     Assessment/Plan Diabetes ([place]) blood glucose control important in reducing the progression of atherosclerotic disease. Also, involved in wound healing. On appropriate medications.   Tobacco use disorder We had a discussion for approximately 3-4 minutes regarding the absolute need for smoking cessation due to the deleterious nature of tobacco on the vascular system. We discussed the tobacco use would diminish patency of any intervention, and likely significantly worsen progressio of disease. We discussed multiple agents for quitting including replacement therapy or medications to reduce cravings such as Chantix. The patient voices their understanding of the importance of smoking cessation. She says she has not smoked for 2 days, and does not plan on resuming.   Carotid aneurysm, left ([facility]) In review of the patient's recent CT  angiogram, she does have some aneurysmal degeneration of her distal cervical internal carotid artery just before the base of the skull. This would be a little bit high for a clamp injury at the time of surgery, but could be potentially secondary  to the shunt that was used with her carotid endarterectomy last year. She has not had any follow-up studies since that surgery so it is not entirely clear to me. Although I think this is unlikely to be the cause of her recent symptoms, we will need to monitor this. She will return with a CT angiogram in 6 months. She should continue her aspirin. Smoking cessation strongly recommended.  Carotid stenosis The patient is status post left carotid endarterectomy with the small 5 mm aneurysm of her distal internal carotid artery on the left as documented above. She has less than 50% right ICA stenosis although there is a calcific plaque present. I will plan to see her back in 6 months with a CT angiogram for further evaluation of both her stenosis and her aneurysmal disease. Her endarterectomy site is widely patent. Continue aspirin.    Leotis Pain, MD  07/13/2016 12:22 PM    This note was created with Dragon medical transcription system.  Any errors from dictation are purely unintentional

## 2016-07-13 NOTE — Assessment & Plan Note (Signed)
We had a discussion for approximately 3-4 minutes regarding the absolute need for smoking cessation due to the deleterious nature of tobacco on the vascular system. We discussed the tobacco use would diminish patency of any intervention, and likely significantly worsen progressio of disease. We discussed multiple agents for quitting including replacement therapy or medications to reduce cravings such as Chantix. The patient voices their understanding of the importance of smoking cessation. She says she has not smoked for 2 days, and does not plan on resuming.

## 2016-07-13 NOTE — Assessment & Plan Note (Signed)
blood glucose control important in reducing the progression of atherosclerotic disease. Also, involved in wound healing. On appropriate medications.  

## 2016-07-13 NOTE — Assessment & Plan Note (Signed)
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 This would be a little bit high for a clamp injury at the time of surgery, but could be potentially secondary to the shunt that was used with her carotid endarterectomy last year. She has not had any follow-up studies since that surgery so it is not entirely clear to me. Although I think this is unlikely to be the cause of her recent symptoms, we will need to monitor this. She will return with a CT angiogram in 6 months. She should continue her aspirin. Smoking cessation strongly recommended.

## 2016-07-13 NOTE — Assessment & Plan Note (Signed)
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 She has less than 50% right ICA stenosis although there is a calcific plaque present. I will plan to see her back in 6 months with a CT angiogram for further evaluation of both her stenosis and her aneurysmal disease. Her endarterectomy site is widely patent. Continue aspirin.

## 2016-07-19 ENCOUNTER — Other Ambulatory Visit (INDEPENDENT_AMBULATORY_CARE_PROVIDER_SITE_OTHER): Payer: Self-pay | Admitting: Vascular Surgery

## 2016-08-14 ENCOUNTER — Other Ambulatory Visit (INDEPENDENT_AMBULATORY_CARE_PROVIDER_SITE_OTHER): Payer: Self-pay | Admitting: Vascular Surgery

## 2016-08-20 ENCOUNTER — Other Ambulatory Visit (INDEPENDENT_AMBULATORY_CARE_PROVIDER_SITE_OTHER): Payer: Self-pay | Admitting: Vascular Surgery

## 2016-08-26 DIAGNOSIS — R011 Cardiac murmur, unspecified: Secondary | ICD-10-CM | POA: Insufficient documentation

## 2016-09-03 ENCOUNTER — Ambulatory Visit: Payer: Medicare Other | Admitting: Podiatry

## 2016-09-04 ENCOUNTER — Ambulatory Visit: Payer: Medicare Other | Admitting: Podiatry

## 2016-09-24 ENCOUNTER — Ambulatory Visit: Payer: Medicare Other | Admitting: Podiatry

## 2016-10-11 ENCOUNTER — Other Ambulatory Visit: Payer: Self-pay | Admitting: Family Medicine

## 2016-10-11 DIAGNOSIS — Z1231 Encounter for screening mammogram for malignant neoplasm of breast: Secondary | ICD-10-CM

## 2016-10-18 ENCOUNTER — Encounter: Payer: Self-pay | Admitting: Podiatry

## 2016-10-18 ENCOUNTER — Ambulatory Visit (INDEPENDENT_AMBULATORY_CARE_PROVIDER_SITE_OTHER): Payer: Medicare Other | Admitting: Podiatry

## 2016-10-18 VITALS — BP 162/73 | HR 62 | Ht 62.0 in | Wt 171.0 lb

## 2016-10-18 DIAGNOSIS — M2010 Hallux valgus (acquired), unspecified foot: Secondary | ICD-10-CM

## 2016-10-18 DIAGNOSIS — L84 Corns and callosities: Secondary | ICD-10-CM | POA: Diagnosis not present

## 2016-10-18 NOTE — Addendum Note (Signed)
Addended byDeidre Ala, Valeriano Bain L on: 10/18/2016 11:26 AM   Modules accepted: Orders

## 2016-10-18 NOTE — Progress Notes (Addendum)
   Subjective:    Patient ID: Tammy Armstrong, female    DOB: 05-Apr-1951, 66 y.o.   MRN: [id number]  HPI this patient presents the office with chief complaint of pain noted on the calluses on the bottom of both of her feet. She says the calluses hurt when she walks and wears her shoes. Patient is diabetic on Plavix. He presents the office and request diabetic shoes. She presents for evaluation and treatment of her diabetic conditions    Review of Systems  All other systems reviewed and are negative.      Objective:   Physical Exam GENERAL APPEARANCE: Alert, conversant. Appropriately groomed. No acute distress.  VASCULAR: Pedal pulses are  palpable at  [facility] and PT bilateral.  Capillary refill time is immediate to all digits,  Normal temperature gradient.   NEUROLOGIC: sensation is absent to 5.07 monofilament at 5/5 sites bilateral.  Light touch is intact bilateral, Muscle strength normal.  MUSCULOSKELETAL: acceptable muscle strength, tone and stability bilateral.  Intrinsic muscluature intact bilateral.  HAV  B/L.  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  DERMATOLOGIC: skin color, texture, and turgor are within normal limits.  No preulcerative lesions or ulcers  are seen, no interdigital maceration noted.  No open lesions present.  Digital nails are asymptomatic. No drainage noted. Callus  sub 1  B/L         Assessment & Plan:  Callus secondary HAV  B/L   IE  Debride callus  Order diabetic footwear for DPN and HAV.  RTC 4 months for foot care.   Gardiner Barefoot DPM

## 2016-10-25 ENCOUNTER — Ambulatory Visit: Payer: Medicare Other

## 2016-11-09 ENCOUNTER — Ambulatory Visit: Payer: Medicare Other | Attending: Family Medicine

## 2016-11-13 ENCOUNTER — Other Ambulatory Visit (INDEPENDENT_AMBULATORY_CARE_PROVIDER_SITE_OTHER): Payer: Self-pay | Admitting: Vascular Surgery

## 2016-12-13 ENCOUNTER — Ambulatory Visit (INDEPENDENT_AMBULATORY_CARE_PROVIDER_SITE_OTHER): Payer: Medicare Other | Admitting: Podiatry

## 2016-12-13 ENCOUNTER — Encounter: Payer: Self-pay | Admitting: Podiatry

## 2016-12-13 DIAGNOSIS — M2010 Hallux valgus (acquired), unspecified foot: Secondary | ICD-10-CM

## 2016-12-13 DIAGNOSIS — E1142 Type 2 diabetes mellitus with diabetic polyneuropathy: Secondary | ICD-10-CM

## 2016-12-13 DIAGNOSIS — L84 Corns and callosities: Secondary | ICD-10-CM

## 2016-12-13 NOTE — Patient Instructions (Signed)

## 2016-12-13 NOTE — Progress Notes (Addendum)
Patient ID: Tammy Armstrong, female   DOB: [birth year], 66 y.o.   MRN: [id number] Patient presents for diabetic shoe pick up, shoes are tried on for good fit.  Patient received 1 Pair and 3 pairs custom molded diabetic inserts.  Verbal and written break in and wear instructions given.  Patient will follow up for scheduled routine care.     Subjective:    Patient ID: Tammy Armstrong, female    DOB: 05-26-1951, 66 y.o.   MRN: [id number]  HPI this patient presents the office to pick up her diabettic shoes.    Review of Systems  All other systems reviewed and are negative.      Objective:   Physical Exam GENERAL APPEARANCE: Alert, conversant. Appropriately groomed. No acute distress.  VASCULAR: Pedal pulses are  palpable at  [facility] and PT bilateral.  Capillary refill time is immediate to all digits,  Normal temperature gradient.   NEUROLOGIC: sensation is absent to 5.07 monofilament at 5/5 sites bilateral.  Light touch is intact bilateral, Muscle strength normal.  MUSCULOSKELETAL: acceptable muscle strength, tone and stability bilateral.  Intrinsic muscluature intact bilateral.  HAV  B/L.  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  DERMATOLOGIC: skin color, texture, and turgor are within normal limits.  No preulcerative lesions or ulcers  are seen, no interdigital maceration noted.  No open lesions present.  Digital nails are asymptomatic. No drainage noted. Callus  sub 1  B/L         Assessment & Plan:  Callus secondary HAV  B/L  DPN   Dispense diabetic shoes.   Gardiner Barefoot DPM

## 2016-12-20 ENCOUNTER — Ambulatory Visit
Admission: RE | Admit: 2016-12-20 | Discharge: 2016-12-20 | Disposition: A | Discharge status: Home / Self Care | Payer: Medicare Other | Source: Ambulatory Visit | Attending: Family Medicine | Admitting: Family Medicine

## 2016-12-20 DIAGNOSIS — Z1231 Encounter for screening mammogram for malignant neoplasm of breast: Secondary | ICD-10-CM | POA: Diagnosis present

## 2016-12-26 ENCOUNTER — Ambulatory Visit (INDEPENDENT_AMBULATORY_CARE_PROVIDER_SITE_OTHER): Payer: Medicare Other | Admitting: Podiatry

## 2016-12-26 DIAGNOSIS — E1142 Type 2 diabetes mellitus with diabetic polyneuropathy: Secondary | ICD-10-CM

## 2016-12-26 NOTE — Progress Notes (Signed)
Patient came in today complaining that her shoes were too big...Marland KitchenMarland KitchenShe had a 6.5 M sized shoe.  She was re-measured and she did measure 6.5 with her 2nd digit right measuring exactly on the 6.5 line.   The shoes also had been worn outside and were soled on the bottom.Marland Kitchen  She was told under these circumstances we could not return them.Marland KitchenMarland KitchenMarland KitchenPatient went way irrate and expressed disappointment in TFC.

## 2017-01-21 ENCOUNTER — Ambulatory Visit: Payer: Medicare Other | Admitting: Podiatry

## 2017-03-08 ENCOUNTER — Other Ambulatory Visit (INDEPENDENT_AMBULATORY_CARE_PROVIDER_SITE_OTHER): Payer: Self-pay | Admitting: Vascular Surgery

## 2017-03-19 ENCOUNTER — Other Ambulatory Visit: Payer: Self-pay | Admitting: Family Medicine

## 2017-03-19 DIAGNOSIS — E2839 Other primary ovarian failure: Secondary | ICD-10-CM

## 2017-03-20 ENCOUNTER — Other Ambulatory Visit (INDEPENDENT_AMBULATORY_CARE_PROVIDER_SITE_OTHER): Payer: Self-pay | Admitting: Vascular Surgery

## 2017-03-26 ENCOUNTER — Other Ambulatory Visit (INDEPENDENT_AMBULATORY_CARE_PROVIDER_SITE_OTHER): Payer: Self-pay | Admitting: Vascular Surgery

## 2017-04-28 IMAGING — US US CAROTID DUPLEX BILAT
1 series · 13 of 24 positions shown · non-contrast
Comparison: None.

CLINICAL DATA: TIA/stroke. History of left-sided carotid stent.
History of hypertension, hyperlipidemia, diabetes and smoking.

EXAM:
BILATERAL CAROTID DUPLEX ULTRASOUND
TECHNIQUE: Gray scale imaging, color Doppler and duplex ultrasound were
performed of bilateral carotid and vertebral arteries in the neck.

[Series 1: us carotid duplex bilat · 0.06mm/px · 13 of 64 slices shown]
[im 1/64]
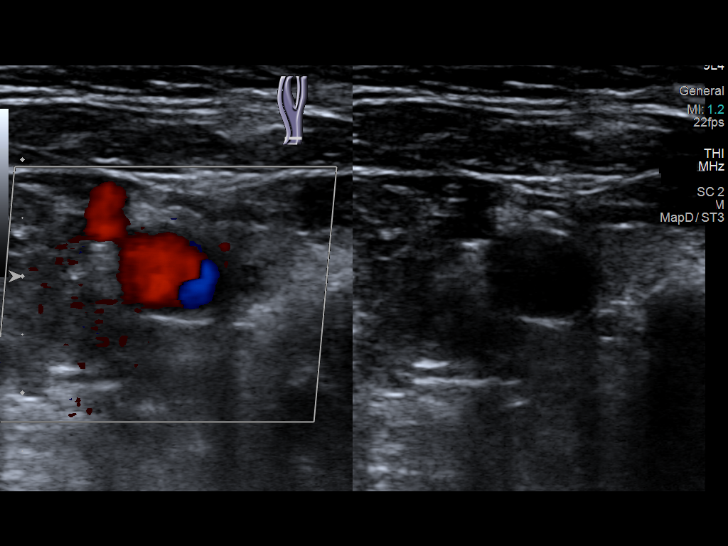
[im 6/64]
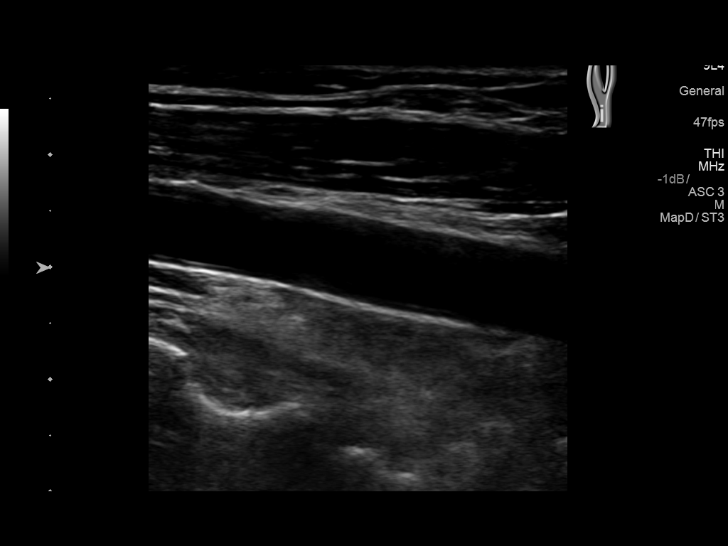
[im 11/64]
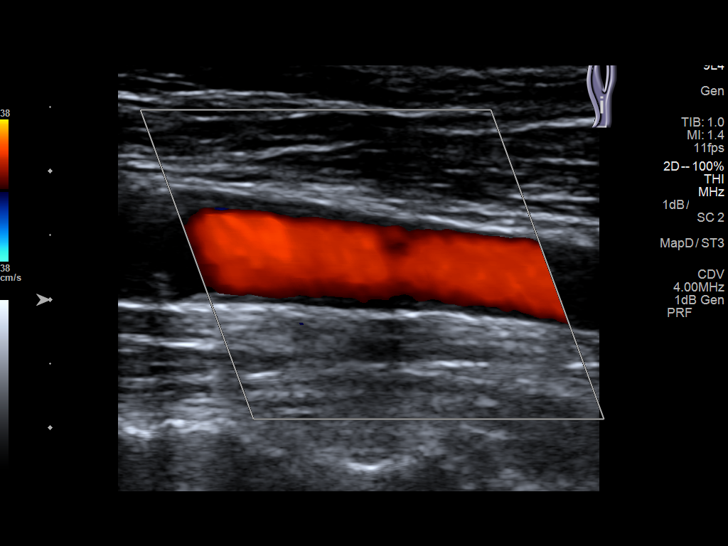
[im 17/64]
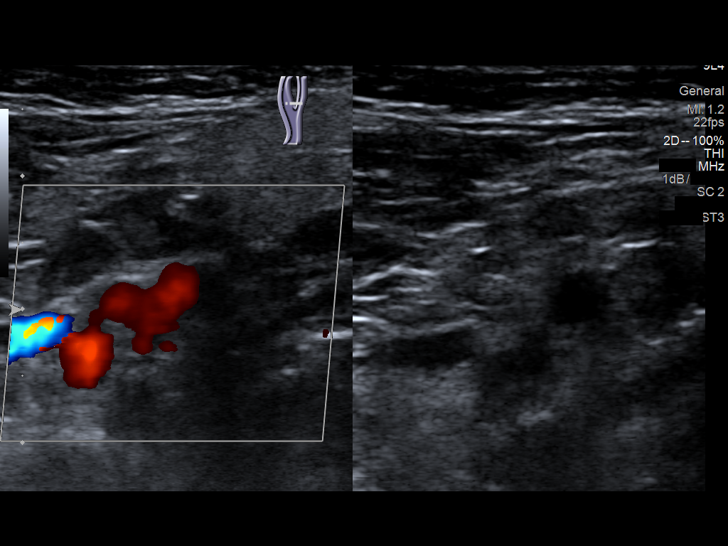
[im 22/64]
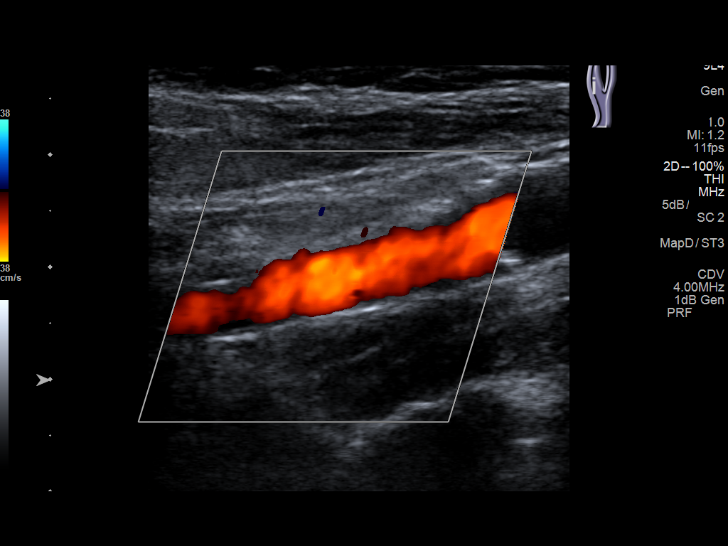
[im 28/64]
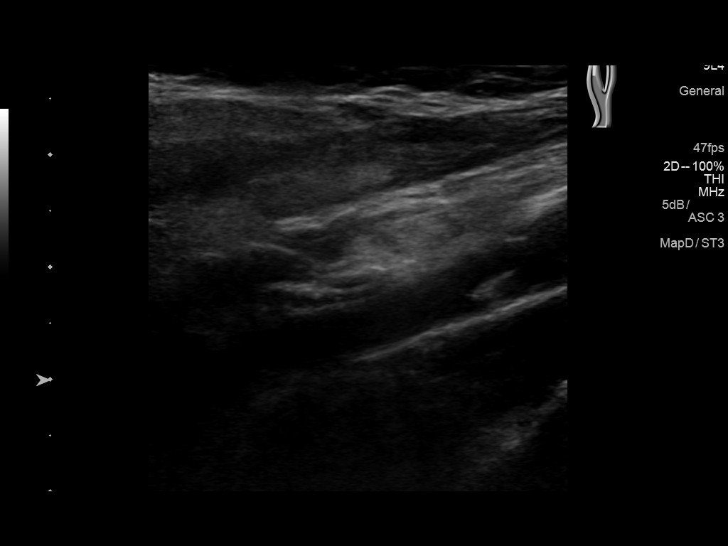
[im 33/64]
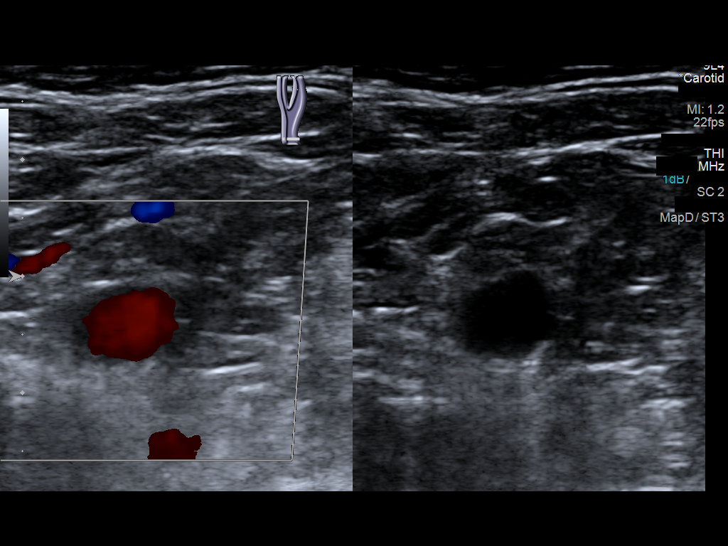
[im 36/64]
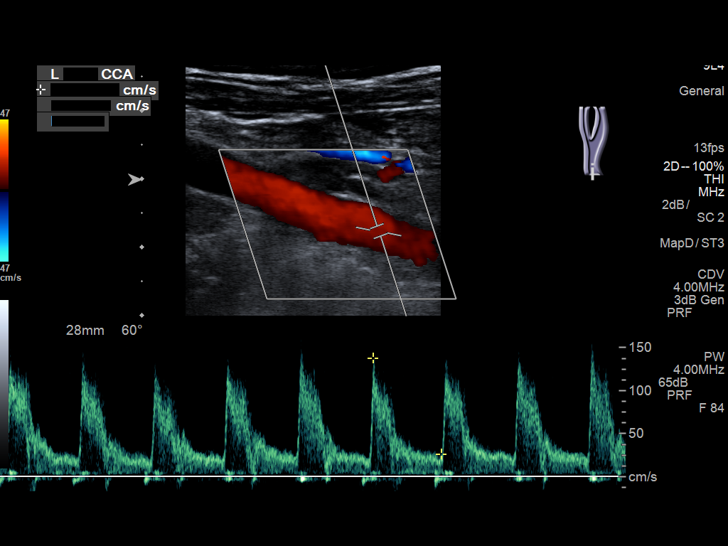
[im 42/64]
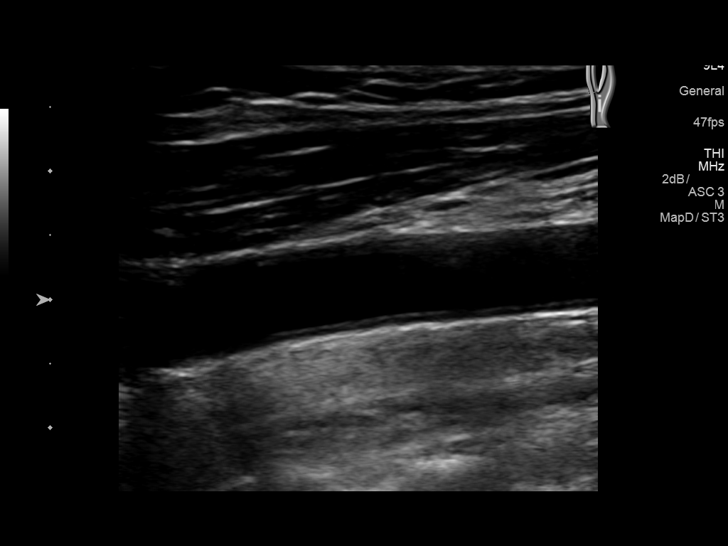
[im 47/64]
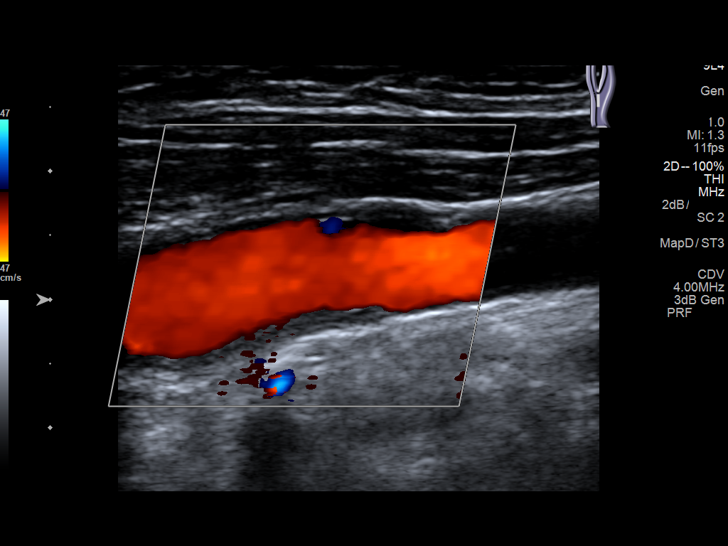
[im 53/64]
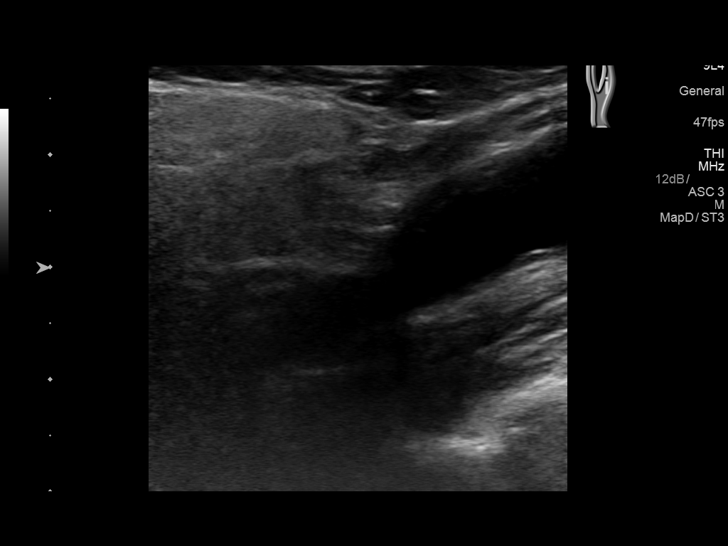
[im 58/64]
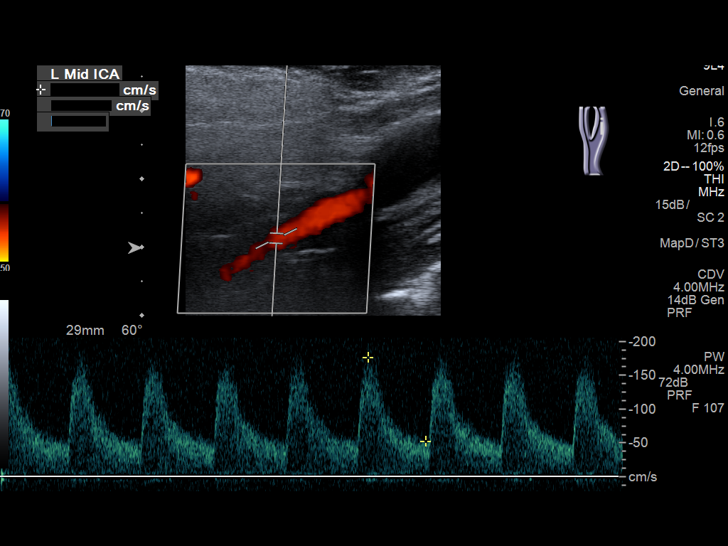
[im 64/64]
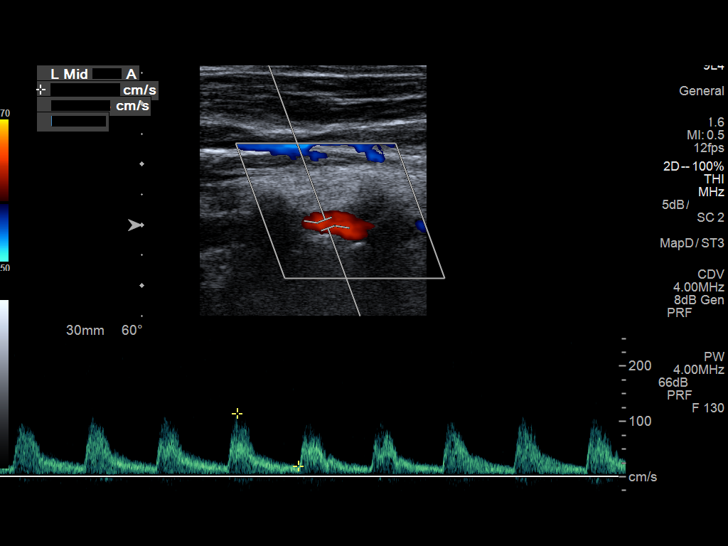

[13 of 24 positions shown; findings below may reference images not displayed]

FINDINGS: Criteria: Quantification of carotid stenosis is based on velocity
parameters that correlate the residual internal carotid diameter
with NASCET-based stenosis levels, using the diameter of the distal
internal carotid lumen as the denominator for stenosis measurement.

The following velocity measurements were obtained:

RIGHT

ICA:  142/41 cm/sec

CCA:  98/24 cm/sec

SYSTOLIC ICA/CCA RATIO:

DIASTOLIC ICA/CCA RATIO:

ECA:  153 cm/sec

LEFT

ICA:  213/75 cm/sec

CCA:  123/26 cm/sec

SYSTOLIC ICA/CCA RATIO:

DIASTOLIC ICA/CCA RATIO:

ECA:  176 cm/sec

RIGHT CAROTID ARTERY: There is a minimal amount of eccentric mixed
echogenic plaque within distal aspect the right common carotid
artery (image 11). There is a moderate amount of eccentric mixed
echogenic partially shadowing plaque within the right carotid bulb
(images 14 and 15), extending to involve the origin and proximal
aspect the right internal carotid artery (image 22) which results in
elevated peak systolic velocities throughout the interrogated course
the right internal carotid artery (greatest acquired peak systolic
velocity within mid ICA measures 142 cm/sec - image 27).

RIGHT VERTEBRAL ARTERY:  Antegrade Flow

LEFT CAROTID ARTERY: Post stenting of the left carotid bulb. There
is a minimal to moderate amount of in-stent intimal wall thickening
within the left carotid bulb (representative image 46). There are
residual/recurrent elevated peak systolic velocities within the left
internal carotid artery (greatest acquired peak systolic velocity
within the distal aspect of the left ICA measures 213 cm/sec - image
63).

LEFT VERTEBRAL ARTERY:  Antegrade flow
IMPRESSION: 1. Post left carotid bulb stenting with in-stent intimal wall
thickening which results in elevated peak systolic velocities within
the left internal carotid artery compatible with the higher end of
the 50-69% luminal narrowing range. Further evaluation with CTA
could performed as clinically indicated.
2. Minimal to moderate amount of right-sided atherosclerotic plaque
results in elevated peak systolic velocities within the right
internal carotid artery compatible with the 50-69% luminal narrowing
range.

## 2017-05-15 DIAGNOSIS — R2681 Unsteadiness on feet: Secondary | ICD-10-CM | POA: Insufficient documentation

## 2017-10-23 ENCOUNTER — Other Ambulatory Visit: Payer: Self-pay | Admitting: Family Medicine

## 2017-10-23 DIAGNOSIS — Z780 Asymptomatic menopausal state: Secondary | ICD-10-CM

## 2017-10-28 ENCOUNTER — Institutional Professional Consult (permissible substitution): Payer: Medicare Other | Admitting: Internal Medicine

## 2017-11-12 IMAGING — CT CT ANGIO NECK
1 of 11 series · 5 of 33 positions shown · IV contrast (APPLIED)
Comparison: Carotid ultrasound 06/28/2016. CT head 06/27/2016. CT
head 04/29/2015. CTA head neck 04/07/2015.

CLINICAL DATA: Sudden onset of numbness and tingling of the RIGHT
face, RIGHT arm and RIGHT hand with weakness. This occurred earlier
today. Symptoms have now resolved. Stroke risk factors include
hypertension, diabetes, and hypercholesterolemia.

EXAM:
CT ANGIOGRAPHY HEAD AND NECK
TECHNIQUE: Multidetector CT imaging of the head and neck was performed using
the standard protocol during bolus administration of intravenous
contrast. Multiplanar CT image reconstructions and MIPs were
obtained to evaluate the vascular anatomy. Carotid stenosis
measurements (when applicable) are obtained utilizing NASCET
criteria, using the distal internal carotid diameter as the
denominator.
CONTRAST:  Isovue 370, 75 mL.

[Series 11: ax thin · axial · 0.39mm/px · z∈[-382,-139]mm · 5 of 373 slices shown]
[im 63/373  soft-tissue]
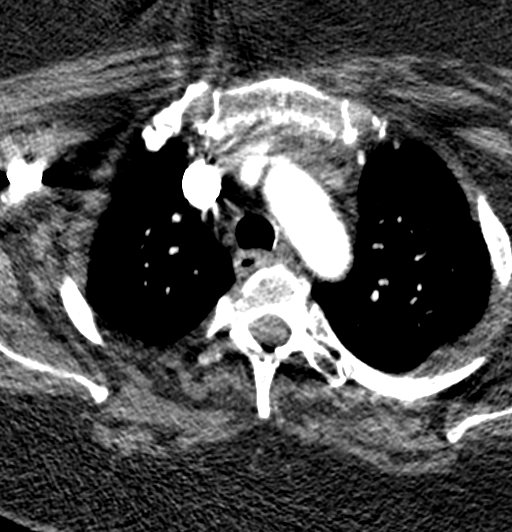
[im 125/373  bone]
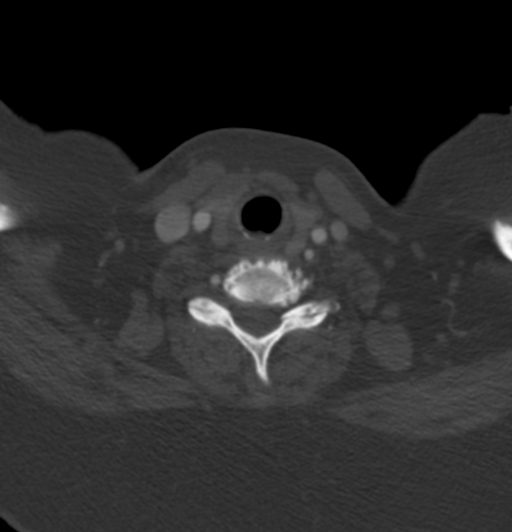
[im 187/373  soft-tissue]
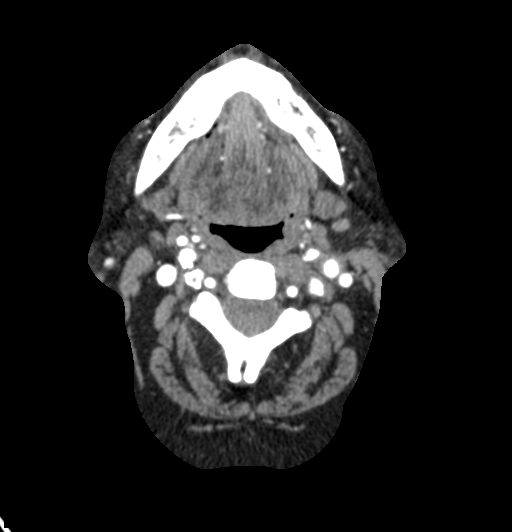
[im 249/373  bone]
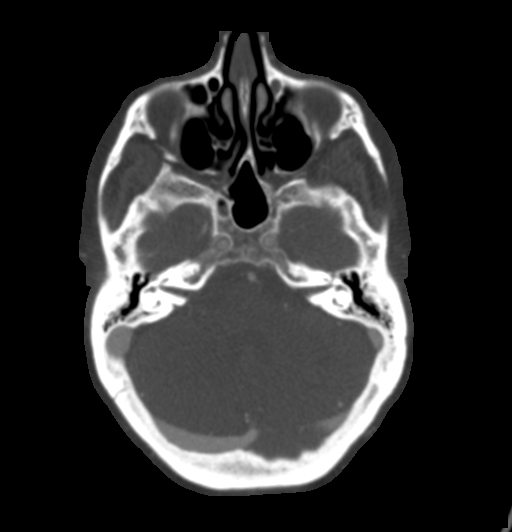
[im 311/373  soft-tissue]
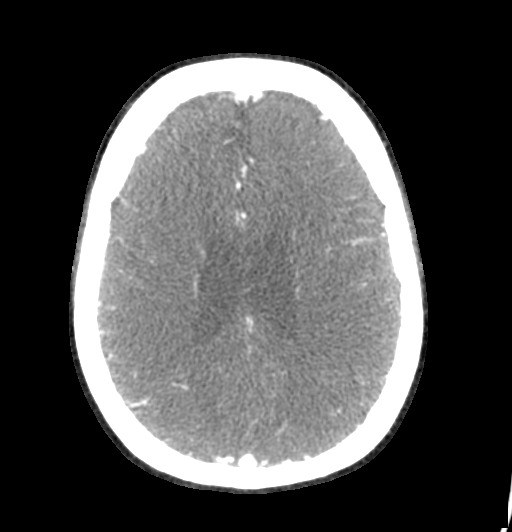

[5 of 33 positions shown; findings below may reference images not displayed]

FINDINGS: CT HEAD

Calvarium and skull base: No fracture or destructive lesion.
Mastoids and middle ears are grossly clear.

Paranasal sinuses: Imaged portions are clear.

Orbits: Negative.

Brain: No evidence of acute abnormality, including acute infarct,
hemorrhage, hydrocephalus, or mass lesion. Mild atrophy and chronic
microvascular ischemic change, stable. No features suggestive of
acute infarction. Calcifying mid pontine lesion, increased constant
acuity from priors, favored to represent benign occult vascular
malformation such as capillary telangiectasia. Heavily calcified
LEFT frontal convexity mass, approximately 9 x 10 mm cross-section,
consistent with meningioma.

CTA NECK

Aortic arch: Normal variant branching with LEFT vertebral
originating directly from the arch. The Imaged portion shows no
evidence of aneurysm or dissection. No significant stenosis of the
major arch vessel origins.

Right carotid system: Extensive soft plaque involving the common
carotid artery in its distal third, contributing to mild luminal
narrowing. Calcific and soft plaque extends from the bifurcation
cm into the RIGHT internal carotid artery. Luminal measurements of
2.7/3.9 proximal/ distal medial to no significant stenosis. No
dissection or distal occlusion.

Left carotid system: Since the prior CTA, the patient has undergone
carotid endarterectomy. No residual stenosis at the bifurcation. In
the mid cervical ICA, there is a pseudoaneurysm, measuring 3 x 5 x 8
mm, 8 mm being craniocaudal extent, without visible internal
thrombus. No significant narrowing of the cervical ICA.

Vertebral arteries: Codominant. No evidence of dissection, stenosis
(50% or greater) or occlusion.

Nonvascular soft tissues. No neck mass. COPD. Spondylosis. Poor
dentition.

Review of the MIP images confirms the above findings.

CTA HEAD

Anterior circulation: Calcified non stenotic BILATERAL cavernous
carotid arteries. No evidence for intracranial dissection or emboli
to the LEFT anterior circulation. No significant stenosis, proximal
occlusion, aneurysm, or vascular malformation.

Posterior circulation: Codominant vertebral arteries. Basilar artery
widely patent. No significant stenosis, proximal occlusion,
aneurysm, or vascular malformation.

Venous sinuses: Patent.

Anatomic variants: None of significance.

Delayed phase:   No abnormal intracranial enhancement.

Review of the MIP images confirms the above findings.
IMPRESSION: Status post LEFT carotid endarterectomy without residual stenosis or
cuff defect.

LEFT ICA pseudoaneurysm, 3 x 5 x 8 mm, without visible luminal
compromise of the parent vessel or internal thrombus. This could
however serve as a source of microemboli. Consider formal catheter
angiogram or continued CTA surveillance as clinically indicated

Stable non stenotic calcified and soft plaque involving distal RIGHT
CCA and proximal RIGHT ICA. Less than 50% stenosis not significantly
progressed from 04/07/2015.

## 2017-12-14 ENCOUNTER — Emergency Department: Payer: Medicare Other

## 2017-12-14 ENCOUNTER — Encounter: Payer: Self-pay | Admitting: Emergency Medicine

## 2017-12-14 ENCOUNTER — Other Ambulatory Visit: Payer: Self-pay

## 2017-12-14 ENCOUNTER — Emergency Department
Admission: EM | Admit: 2017-12-14 | Discharge: 2017-12-15 | Disposition: A | Discharge status: Home / Self Care | Payer: Medicare Other | Attending: Emergency Medicine | Admitting: Emergency Medicine

## 2017-12-14 DIAGNOSIS — F17210 Nicotine dependence, cigarettes, uncomplicated: Secondary | ICD-10-CM | POA: Insufficient documentation

## 2017-12-14 DIAGNOSIS — J449 Chronic obstructive pulmonary disease, unspecified: Secondary | ICD-10-CM | POA: Insufficient documentation

## 2017-12-14 DIAGNOSIS — R112 Nausea with vomiting, unspecified: Secondary | ICD-10-CM | POA: Diagnosis present

## 2017-12-14 DIAGNOSIS — Z7984 Long term (current) use of oral hypoglycemic drugs: Secondary | ICD-10-CM | POA: Diagnosis not present

## 2017-12-14 DIAGNOSIS — F0390 Unspecified dementia without behavioral disturbance: Secondary | ICD-10-CM | POA: Diagnosis not present

## 2017-12-14 DIAGNOSIS — Z79899 Other long term (current) drug therapy: Secondary | ICD-10-CM | POA: Diagnosis not present

## 2017-12-14 DIAGNOSIS — I10 Essential (primary) hypertension: Secondary | ICD-10-CM | POA: Diagnosis not present

## 2017-12-14 DIAGNOSIS — E119 Type 2 diabetes mellitus without complications: Secondary | ICD-10-CM | POA: Insufficient documentation

## 2017-12-14 DIAGNOSIS — R197 Diarrhea, unspecified: Secondary | ICD-10-CM | POA: Insufficient documentation

## 2017-12-14 DIAGNOSIS — Z7982 Long term (current) use of aspirin: Secondary | ICD-10-CM | POA: Diagnosis not present

## 2017-12-14 DIAGNOSIS — R1010 Upper abdominal pain, unspecified: Secondary | ICD-10-CM

## 2017-12-14 LAB — COMPREHENSIVE METABOLIC PANEL
ALK PHOS: 74 U/L (ref 38–126)
ALT: 23 U/L (ref 14–54)
AST: 32 U/L (ref 15–41)
Albumin: 3.9 g/dL (ref 3.5–5.0)
Anion gap: 11 (ref 5–15)
BILIRUBIN TOTAL: 0.5 mg/dL (ref 0.3–1.2)
BUN: 22 mg/dL — AB (ref 6–20)
CALCIUM: 9.6 mg/dL (ref 8.9–10.3)
CO2: 25 mmol/L (ref 22–32)
CREATININE: 1.05 mg/dL — AB (ref 0.44–1.00)
Chloride: 99 mmol/L — ABNORMAL LOW (ref 101–111)
GFR, EST NON AFRICAN AMERICAN: 55 mL/min — AB (ref >60)
Glucose, Bld: 412 mg/dL — ABNORMAL HIGH (ref 65–99)
Potassium: 5.6 mmol/L — ABNORMAL HIGH (ref 3.5–5.1)
Sodium: 135 mmol/L (ref 135–145)
Total Protein: 7.6 g/dL (ref 6.5–8.1)

## 2017-12-14 LAB — CBC WITH DIFFERENTIAL/PLATELET
Basophils Absolute: 0.1 10*3/uL (ref 0–0.1)
Basophils Relative: 1 %
Eosinophils Absolute: 0.1 10*3/uL (ref 0–0.7)
Eosinophils Relative: 1 %
HEMATOCRIT: 39.6 % (ref 35.0–47.0)
HEMOGLOBIN: 12.7 g/dL (ref 12.0–16.0)
LYMPHS ABS: 2.1 10*3/uL (ref 1.0–3.6)
LYMPHS PCT: 19 %
MCH: 25.7 pg — AB (ref 26.0–34.0)
MCHC: 32 g/dL (ref 32.0–36.0)
MCV: 80.3 fL (ref 80.0–100.0)
Monocytes Absolute: 0.5 10*3/uL (ref 0.2–0.9)
Monocytes Relative: 4 %
NEUTROS ABS: 8.7 10*3/uL — AB (ref 1.4–6.5)
NEUTROS PCT: 75 %
Platelets: 322 10*3/uL (ref 150–440)
RBC: 4.93 MIL/uL (ref 3.80–5.20)
RDW: 20.3 % — ABNORMAL HIGH (ref 11.5–14.5)
WBC: 11.4 10*3/uL — AB (ref 3.6–11.0)

## 2017-12-14 LAB — TROPONIN I: Troponin I: <0.03 ng/mL (ref <0.03)

## 2017-12-14 LAB — LIPASE, BLOOD: LIPASE: 41 U/L (ref 11–51)

## 2017-12-14 MED ORDER — SODIUM CHLORIDE 0.9 % IV BOLUS
500.0000 mL | Freq: Once | INTRAVENOUS | Status: AC
  Administered 2017-12-14: 500 mL via INTRAVENOUS

## 2017-12-14 MED ORDER — GI COCKTAIL ~~LOC~~
30.0000 mL | Freq: Once | ORAL | Status: AC
  Administered 2017-12-14: 30 mL via ORAL
  Filled 2017-12-14: qty 30

## 2017-12-14 MED ORDER — SODIUM CHLORIDE 0.9 % IV BOLUS: 30.0000 mL/kg | Freq: Once | INTRAVENOUS | Status: DC

## 2017-12-14 MED ORDER — ONDANSETRON HCL 4 MG/2ML IJ SOLN
4.0000 mg | Freq: Once | INTRAMUSCULAR | Status: AC
  Administered 2017-12-14: 4 mg via INTRAVENOUS
  Filled 2017-12-14: qty 2

## 2017-12-14 MED ORDER — IOPAMIDOL (ISOVUE-300) INJECTION 61%
100.0000 mL | Freq: Once | INTRAVENOUS | Status: AC | PRN
  Administered 2017-12-14: 100 mL via INTRAVENOUS

## 2017-12-14 NOTE — ED Triage Notes (Signed)
N/v/d since yesterday. Last emesis 3hrs ago. Pt ambulated from ems cot to bed with steady gait in nad.

## 2017-12-14 NOTE — ED Provider Notes (Addendum)
[facility] Emergency Department Provider Note  ____________________________________________   I have reviewed the triage vital signs and the nursing notes. Where available I have reviewed prior notes and, if possible and indicated, outside hospital notes.    HISTORY  Chief Complaint Emesis and Abdominal Pain    HPI Tammy Armstrong is a 67 y.o. female with a history of reflux anxiety, diabetes depression hepatitis status post gallbladder removal years ago, COPD, bipolar disorder anxiety and migraines among other medical problems presents today complaining of loose stools for 2 to 3 days and epigastric discomfort.  Feels like her reflux disease.  States that normally she takes Maalox for this kind of pain but she did not have any Maalox.  She has been taking stool softeners prior to this.  She denies any melena bright red blood per rectum or fever.  She has not thrown up.  She has had no hematemesis.  She denies any chest pain or shortness of breath or exertional symptoms.  She has a crampy on and off discomfort which is associated with loose stools.  No recent antibiotics.  No recent travel no antecedent symptoms, abdominal pain is a cramping discomfort, comes and goes its in the epigastric region with no radiation, nothing makes it better nothing makes it worse except for eating sometimes no other associated symptoms besides loose stools.  Past Medical History:  Diagnosis Date  . Anxiety   . Arthritis   . Carotid artery occlusion   . Cigarette smoker one half pack a day or less   . COPD (chronic obstructive pulmonary disease) ([place]) 07/04/2011  . Depression   . Diabetes mellitus   . Diabetes mellitus 07/04/2011  . GERD (gastroesophageal reflux disease)   . Hepatitis C   . Hypercholesterolemia   . Hypertension   . Migraines   . Pancreatitis   . 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 Respiratory failure ([place]) 07/04/2011  . Seizure ([place]) 07/02/2011  . Shortness  of breath dyspnea   . Stroke [facility])     Patient Active Problem List   Diagnosis Date Noted  . Tobacco use disorder 07/13/2016  . Carotid aneurysm, left ([place]) 07/13/2016  . Right upper extremity numbness   . TIA (transient ischemic attack) 06/27/2016  . Carotid stenosis 04/08/2015  . Migraine 04/06/2015  . Dementia with behavioral disturbance 01/19/2015  . Diabetes ([place]) 07/04/2011  . Anxiety 07/04/2011  . COPD (chronic obstructive pulmonary disease) ([place]) 07/04/2011  . Purulent bronchitis ([place]) 07/04/2011  . 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 ABDOMINAL HYSTERECTOMY    . APPENDECTOMY    . BACK SURGERY  2012   cyst removed from spine , [place]  . CHOLECYSTECTOMY    . ENDARTERECTOMY Left 05/18/2015   Procedure: ENDARTERECTOMY CAROTID;  Surgeon: Algernon Huxley, MD;  Location: [facility] ORS;  Service: Vascular;  Laterality: Left;    Prior to Admission medications   Medication Sig Start Date End Date Taking? Authorizing Provider  acetaminophen (TYLENOL) 500 MG tablet Take 500 mg by mouth every 6 (six) hours as needed.    [provider]  albuterol (PROVENTIL HFA;VENTOLIN HFA) 108 (90 BASE) MCG/ACT inhaler Inhale 2 puffs into the lungs every 6 (six) hours as needed for wheezing or shortness of breath.     [provider]  ALPRAZolam Duanne Moron) 0.5 MG tablet Take 1 tablet 3 times daily for 4 days, then 1 tablet twice daily for 4 days, then 1  tablet daily for 4 days, then 1 tablet every other day until gone 04/02/16   Earleen Newport, MD  ASPIRIN LOW DOSE 81 MG EC tablet TAKE ONE TABLET BY MOUTH EVERY DAY. *DO NOT CRUSH* 03/08/17   Algernon Huxley, MD  atorvastatin (LIPITOR) 40 MG tablet Take by mouth. 08/16/16 08/16/17  [provider]  budesonide-formoterol (SYMBICORT) 80-4.5 MCG/ACT inhaler Inhale 2 puffs into the lungs 2 (two) times daily. 04/08/15   Gladstone Lighter, MD  clopidogrel (PLAVIX) 75 MG tablet TAKE  ONE TABLET BY MOUTH EVERY DAY WITH BREAKFAST 11/13/16   Algernon Huxley, MD  docusate sodium (COLACE) 100 MG capsule Take 100 mg by mouth daily.     [provider]  DULoxetine (CYMBALTA) 60 MG capsule Take 60 mg by mouth 2 (two) times daily.    [provider]  fluticasone furoate-vilanterol (BREO ELLIPTA) 100-25 MCG/INH AEPB Inhale 1 puff into the lungs daily.    [provider]  gabapentin (NEURONTIN) 300 MG capsule Take 300 mg by mouth 4 (four) times daily.    [provider]  glipiZIDE (GLUCOTROL XL) 5 MG 24 hr tablet Take 1 tablet (5 mg total) by mouth daily. 04/02/16   Earleen Newport, MD  hydrOXYzine (ATARAX/VISTARIL) 25 MG tablet  09/21/16   [provider]  ibuprofen (ADVIL,MOTRIN) 200 MG tablet Take 200 mg by mouth every 6 (six) hours as needed for headache or moderate pain.    [provider]  lisinopril (PRINIVIL,ZESTRIL) 20 MG tablet Take 1 tablet (20 mg total) by mouth daily. 04/02/16   Earleen Newport, MD  meloxicam (MOBIC) 7.5 MG tablet Take 7.5 mg by mouth 2 (two) times daily.    [provider]  metFORMIN (GLUCOPHAGE-XR) 500 MG 24 hr tablet Take 500 mg by mouth 2 (two) times daily.    [provider]  metoprolol (TOPROL-XL) 50 MG 24 hr tablet Take 50 mg by mouth daily.      [provider]  ondansetron (ZOFRAN) 4 MG tablet Take 1 tablet (4 mg total) by mouth daily as needed for nausea or vomiting. 08/03/15   Earleen Newport, MD  pantoprazole (PROTONIX) 40 MG tablet Take 40 mg by mouth 2 (two) times daily.    [provider]  QUEtiapine (SEROQUEL) 25 MG tablet Take 25 mg by mouth at bedtime.    [provider]  traZODone (DESYREL) 50 MG tablet Take 50 mg by mouth at bedtime.    [provider]  ziprasidone (GEODON) 40 MG capsule Take 40 mg by mouth 2 (two) times daily with a meal.     [provider]    Allergies Promethazine hcl; Ciprofloxacin; Doxycycline;  Imitrex [sumatriptan base]; Morphine and related; Penicillins; Zantac [ranitidine hcl]; and Ketorolac tromethamine  Family History  Problem Relation Age of Onset  . Hypertension Mother   . Heart attack Mother   . Diabetes Mother   . Anxiety disorder Mother   . Stroke Mother   . Diabetes Father   . Diabetes Brother   . Alcohol abuse Unknown        siblings  . Hypertension Brother   . Breast cancer Neg Hx     Social History Social History   Tobacco Use  . Smoking status: Current Every Day Smoker    Packs/day: 1.00    Types: Cigarettes  . Smokeless tobacco: Never Used  Substance Use Topics  . Alcohol use: No  . Drug use: No    Review  of Systems Constitutional: No fever/chills Eyes: No visual changes. ENT: No sore throat. No stiff neck no neck pain Cardiovascular: Denies chest pain. Respiratory: Denies shortness of breath. Gastrointestinal:   no vomiting.  + diarrhea.  No constipation. Genitourinary: Negative for dysuria. Musculoskeletal: Negative lower extremity swelling Skin: Negative for rash. Neurological: Negative for severe headaches, focal weakness or numbness.   ____________________________________________   PHYSICAL EXAM:  VITAL SIGNS: ED Triage Vitals  Enc Vitals Group     BP 12/14/17 1938 (!) 186/68     Pulse Rate 12/14/17 1938 81     Resp 12/14/17 1938 16     Temp 12/14/17 1938 98.4 F (36.9 C)     Temp src --      SpO2 12/14/17 1938 97 %     Weight 12/14/17 1940 175 lb (79.4 kg)     Height 12/14/17 1940 5\' 2"  (1.575 m)     Head Circumference --      Peak Flow --      Pain Score 12/14/17 1940 9     Pain Loc --      Pain Edu? --      Excl. in [place]? --     Constitutional: Alert and oriented. Well appearing and in no acute distress. Eyes: Conjunctivae are normal Head: Atraumatic HEENT: No congestion/rhinnorhea. Mucous membranes are moist.  Oropharynx non-erythematous Neck:   Nontender with no meningismus, no masses, no  stridor Cardiovascular: Normal rate, regular rhythm. Grossly normal heart sounds.  Good peripheral circulation. Respiratory: Normal respiratory effort.  No retractions. Lungs CTAB. Abdominal: Soft and mild epigastric discomfort nonsurgical abdomen. No distention. No guarding no rebound Back:  There is no focal tenderness or step off.  there is no midline tenderness there are no lesions noted. there is no CVA tenderness Musculoskeletal: No lower extremity tenderness, no upper extremity tenderness. No joint effusions, no DVT signs strong distal pulses no edema Neurologic:  Normal speech and language. No gross focal neurologic deficits are appreciated.  Skin:  Skin is warm, dry and intact. No rash noted. Psychiatric: Mood and affect are normal. Speech and behavior are normal.  ____________________________________________   LABS (all labs ordered are listed, but only abnormal results are displayed)  Labs Reviewed  TROPONIN I  CBC WITH DIFFERENTIAL/PLATELET  COMPREHENSIVE METABOLIC PANEL  LIPASE, BLOOD  URINALYSIS, COMPLETE (UACMP) WITH MICROSCOPIC    Pertinent labs  results that were available during my care of the patient were reviewed by me and considered in my medical decision making (see chart for details). ____________________________________________  EKG  I personally interpreted any EKGs ordered by me or triage Sinus rhythm rate 76 bpm no acute ST elevation or depression, normal axis unremarkable EKG` ____________________________________________  RADIOLOGY  Pertinent labs & imaging results that were available during my care of the patient were reviewed by me and considered in my medical decision making (see chart for details). If possible, patient and/or family made aware of any abnormal findings.  No results found. ____________________________________________    PROCEDURES  Procedure(s) performed: None  Procedures  Critical Care performed:  None  ____________________________________________   INITIAL IMPRESSION / ASSESSMENT AND PLAN / ED COURSE  Pertinent labs & imaging results that were available during my care of the patient were reviewed by me and considered in my medical decision making (see chart for details).  Patient here with loose stools, epigastric abdominal discomfort which is current and she states usually better with Maalox, abdomen is nonsurgical despite 3 days of symptoms we will  check diffuse blood work, EKG troponin place, liver function test, x-ray we will reassess we will give her IV fluids and nausea medication although she is not vomiting.  ----------------------------------------- 11:54 PM on 12/14/2017 -----------------------------------------  Patient remains a symptomatic care, her abdominal pain is not reproducible in her abdomen is benign CT scan blood work all reassuring.  She does have a elevated potassium which could be from hemolysis because she has normal creatinine.  We will give her IV fluid and recheck that.  Sugar was also somewhat elevated, this apparently is not unusual for her we will see how it is after IV fluid she has no anion gap or evidence of DKA or significant dehydration fortunately.  If her urinalysis is reassuring, it is my hope that we can get her safely home, she is tolerating p.o. with no difficulty at this time.  Signed out to dr. Karma Greaser at the end of my shift.    ____________________________________________   FINAL CLINICAL IMPRESSION(S) / ED DIAGNOSES  Final diagnoses:  None      This chart was dictated using voice recognition software.  Despite best efforts to proofread,  errors can occur which can change meaning.      Schuyler Amor, MD 12/14/17 Karl Bales    Schuyler Amor, MD 12/14/17 2038    Schuyler Amor, MD 12/14/17 2354    Schuyler Amor, MD 12/15/17 [phone]

## 2017-12-15 DIAGNOSIS — R112 Nausea with vomiting, unspecified: Secondary | ICD-10-CM | POA: Diagnosis not present

## 2017-12-15 LAB — BASIC METABOLIC PANEL
Anion gap: 7 (ref 5–15)
BUN: 20 mg/dL (ref 6–20)
CHLORIDE: 104 mmol/L (ref 101–111)
CO2: 25 mmol/L (ref 22–32)
CREATININE: 0.92 mg/dL (ref 0.44–1.00)
Calcium: 8.6 mg/dL — ABNORMAL LOW (ref 8.9–10.3)
GFR calc non Af Amer: >60 mL/min (ref >60)
Glucose, Bld: 283 mg/dL — ABNORMAL HIGH (ref 65–99)
POTASSIUM: 4.8 mmol/L (ref 3.5–5.1)
Sodium: 136 mmol/L (ref 135–145)

## 2017-12-15 LAB — URINALYSIS, COMPLETE (UACMP) WITH MICROSCOPIC
Bacteria, UA: NONE SEEN
Bilirubin Urine: NEGATIVE
Glucose, UA: >=500 mg/dL — AB
Hgb urine dipstick: NEGATIVE
KETONES UR: NEGATIVE mg/dL
Leukocytes, UA: NEGATIVE
Nitrite: NEGATIVE
PROTEIN: NEGATIVE mg/dL
Specific Gravity, Urine: >1.046 — ABNORMAL HIGH (ref 1.005–1.030)
pH: 5 (ref 5.0–8.0)

## 2017-12-15 LAB — GLUCOSE, CAPILLARY: GLUCOSE-CAPILLARY: 289 mg/dL — AB (ref 65–99)

## 2017-12-15 LAB — TROPONIN I: Troponin I: <0.03 ng/mL (ref <0.03)

## 2017-12-15 MED ORDER — ACETAMINOPHEN 500 MG PO TABS
1000.0000 mg | ORAL_TABLET | Freq: Once | ORAL | Status: AC
Start: 2017-12-15 — End: 2017-12-15
  Administered 2017-12-15: 1000 mg via ORAL
  Filled 2017-12-15: qty 2

## 2017-12-15 NOTE — ED Notes (Signed)
Pharmacy tech at bedside to discuss pt's medications with her; per procedure performed tonight, pt is to hold Metformin for the next 48 hours; pt's pills are in bubble wrapped/prepackage from local pharmacy and pt isn't clear which pill to hold;

## 2017-12-15 NOTE — ED Provider Notes (Signed)
-----------------------------------------   12:05 AM on 12/15/2017 -----------------------------------------   Assuming care from Dr. Burlene Arnt.  In short, Tammy Armstrong is a 67 y.o. female with a chief complaint of diarrhea.  Refer to the original H&P for additional details.  The current plan of care is to follow up repeat labs and reassess after PO intake.   ----------------------------------------- 1:28 AM on 12/15/2017 -----------------------------------------  Repeat BMP shows improvement in the hyperglycemia as well as a now normal potassium.  Repeat troponin is negative.  The patient is awake, alert, interactive, no acute distress, and has tolerated p.o. intake.  She is appropriate for discharge and outpatient follow-up.   Hinda Kehr, MD 12/15/17 [phone]

## 2017-12-25 DIAGNOSIS — R9089 Other abnormal findings on diagnostic imaging of central nervous system: Secondary | ICD-10-CM | POA: Insufficient documentation

## 2018-01-08 ENCOUNTER — Other Ambulatory Visit (INDEPENDENT_AMBULATORY_CARE_PROVIDER_SITE_OTHER): Payer: Self-pay | Admitting: Vascular Surgery

## 2018-01-15 NOTE — Progress Notes (Deleted)
**  Chest x-ray 12/14/2017, lung cuts CT AP 12/14/2017>> normal lungs, elevated right diaphragm

## 2018-01-16 ENCOUNTER — Institutional Professional Consult (permissible substitution): Payer: Medicare Other | Admitting: Internal Medicine

## 2018-01-17 ENCOUNTER — Encounter: Payer: Self-pay | Admitting: Internal Medicine

## 2018-05-13 ENCOUNTER — Other Ambulatory Visit (INDEPENDENT_AMBULATORY_CARE_PROVIDER_SITE_OTHER): Payer: Self-pay | Admitting: Vascular Surgery

## 2018-05-13 DIAGNOSIS — G9001 Carotid sinus syncope: Secondary | ICD-10-CM

## 2018-05-15 DIAGNOSIS — D329 Benign neoplasm of meninges, unspecified: Secondary | ICD-10-CM | POA: Insufficient documentation

## 2018-05-16 ENCOUNTER — Encounter (INDEPENDENT_AMBULATORY_CARE_PROVIDER_SITE_OTHER): Payer: Medicare Other

## 2018-05-16 ENCOUNTER — Ambulatory Visit (INDEPENDENT_AMBULATORY_CARE_PROVIDER_SITE_OTHER): Payer: Medicare Other | Admitting: Vascular Surgery

## 2018-05-21 ENCOUNTER — Telehealth: Payer: Self-pay | Admitting: *Deleted

## 2018-05-21 DIAGNOSIS — Z122 Encounter for screening for malignant neoplasm of respiratory organs: Secondary | ICD-10-CM

## 2018-05-21 DIAGNOSIS — Z87891 Personal history of nicotine dependence: Secondary | ICD-10-CM

## 2018-05-21 NOTE — Telephone Encounter (Signed)
Received referral for initial lung cancer screening scan. Contacted patient and obtained smoking history,(current, 52 pack year) as well as answering questions related to screening process. Patient denies signs of lung cancer such as weight loss or hemoptysis. Patient denies comorbidity that would prevent curative treatment if lung cancer were found. Patient is scheduled for shared decision making visit and CT scan on 06/17/18 at 215pm.

## 2018-06-17 ENCOUNTER — Ambulatory Visit: Payer: Medicare Other | Attending: Nurse Practitioner

## 2018-06-17 ENCOUNTER — Ambulatory Visit: Admission: RE | Admit: 2018-06-17 | Payer: Medicare Other | Source: Ambulatory Visit

## 2018-06-17 ENCOUNTER — Inpatient Hospital Stay: Payer: Medicare Other | Admitting: Oncology

## 2018-06-17 ENCOUNTER — Encounter: Payer: Self-pay | Admitting: Oncology

## 2018-06-17 NOTE — Progress Notes (Deleted)
In accordance with CMS guidelines, patient has met eligibility criteria including age, absence of signs or symptoms of lung cancer.  Social History   Tobacco Use  . Smoking status: Current Every Day Smoker    Packs/day: 1.00    Years: 52.00    Pack years: 52.00    Types: Cigarettes  . Smokeless tobacco: Never Used  Substance Use Topics  . Alcohol use: No  . Drug use: No     A shared decision-making session was conducted prior to the performance of CT scan. This includes one or more decision aids, includes benefits and harms of screening, follow-up diagnostic testing, over-diagnosis, false positive rate, and total radiation exposure.  Counseling on the importance of adherence to annual lung cancer LDCT screening, impact of co-morbidities, and ability or willingness to undergo diagnosis and treatment is imperative for compliance of the program.  Counseling on the importance of continued smoking cessation for former smokers; the importance of smoking cessation for current smokers, and information about tobacco cessation interventions have been given to patient including [place] and 1800 quit  programs.  Written order for lung cancer screening with LDCT has been given to the patient and any and all questions have been answered to the best of my abilities.   Yearly follow up will be coordinated by Burgess Estelle, Thoracic Navigator.  Faythe Casa, NP 06/17/2018 2:57 PM

## 2018-06-18 ENCOUNTER — Telehealth: Payer: Self-pay | Admitting: *Deleted

## 2018-06-18 NOTE — Telephone Encounter (Signed)
Contacted patient in attempt to reschedule lung screening scan. Patient reports that she would like to contact me in the future to reschedule.

## 2018-07-17 ENCOUNTER — Telehealth: Payer: Self-pay | Admitting: *Deleted

## 2018-07-17 NOTE — Telephone Encounter (Signed)
Received referral for low dose lung cancer screening CT scan. Message left at phone number listed in EMR for patient to call me back to facilitate scheduling scan.  

## 2018-07-21 ENCOUNTER — Telehealth: Payer: Self-pay | Admitting: *Deleted

## 2018-07-21 NOTE — Telephone Encounter (Signed)
Received referral for low dose lung cancer screening CT scan. Message left at phone number listed in EMR for patient to call me back to facilitate scheduling scan. Note, patient had prior no show appt for lung screening.

## 2018-07-22 ENCOUNTER — Encounter: Payer: Self-pay | Admitting: *Deleted

## 2018-08-26 DIAGNOSIS — N3946 Mixed incontinence: Secondary | ICD-10-CM | POA: Insufficient documentation

## 2018-10-08 ENCOUNTER — Telehealth (INDEPENDENT_AMBULATORY_CARE_PROVIDER_SITE_OTHER): Payer: Self-pay | Admitting: Vascular Surgery

## 2018-10-08 NOTE — Telephone Encounter (Signed)
Received a request for refill on Plavix.  The patient has a past medical history of carotid artery stenosis status post endarterectomy.  The patient has not been seen since December 2017.  A refill for 1 month was given with directions for the patient to call the office for a follow-up appointment with a carotid duplex.  The office is also reached out to the patient to make an appointment.

## 2018-10-10 ENCOUNTER — Emergency Department: Payer: Medicare Other

## 2018-10-10 ENCOUNTER — Emergency Department
Admission: EM | Admit: 2018-10-10 | Discharge: 2018-10-10 | Disposition: A | Discharge status: Home / Self Care | Payer: Medicare Other | Source: Home / Self Care | Attending: Emergency Medicine | Admitting: Emergency Medicine

## 2018-10-10 ENCOUNTER — Encounter: Payer: Self-pay | Admitting: Emergency Medicine

## 2018-10-10 ENCOUNTER — Other Ambulatory Visit: Payer: Self-pay

## 2018-10-10 DIAGNOSIS — N289 Disorder of kidney and ureter, unspecified: Secondary | ICD-10-CM

## 2018-10-10 DIAGNOSIS — I2510 Atherosclerotic heart disease of native coronary artery without angina pectoris: Secondary | ICD-10-CM | POA: Diagnosis present

## 2018-10-10 DIAGNOSIS — K7460 Unspecified cirrhosis of liver: Secondary | ICD-10-CM | POA: Diagnosis present

## 2018-10-10 DIAGNOSIS — Z7982 Long term (current) use of aspirin: Secondary | ICD-10-CM

## 2018-10-10 DIAGNOSIS — Z7984 Long term (current) use of oral hypoglycemic drugs: Secondary | ICD-10-CM

## 2018-10-10 DIAGNOSIS — R531 Weakness: Secondary | ICD-10-CM | POA: Insufficient documentation

## 2018-10-10 DIAGNOSIS — Z888 Allergy status to other drugs, medicaments and biological substances status: Secondary | ICD-10-CM

## 2018-10-10 DIAGNOSIS — F0390 Unspecified dementia without behavioral disturbance: Secondary | ICD-10-CM | POA: Insufficient documentation

## 2018-10-10 DIAGNOSIS — E1151 Type 2 diabetes mellitus with diabetic peripheral angiopathy without gangrene: Secondary | ICD-10-CM | POA: Diagnosis present

## 2018-10-10 DIAGNOSIS — I10 Essential (primary) hypertension: Secondary | ICD-10-CM | POA: Insufficient documentation

## 2018-10-10 DIAGNOSIS — J449 Chronic obstructive pulmonary disease, unspecified: Secondary | ICD-10-CM

## 2018-10-10 DIAGNOSIS — F329 Major depressive disorder, single episode, unspecified: Secondary | ICD-10-CM | POA: Diagnosis present

## 2018-10-10 DIAGNOSIS — Z881 Allergy status to other antibiotic agents status: Secondary | ICD-10-CM

## 2018-10-10 DIAGNOSIS — K529 Noninfective gastroenteritis and colitis, unspecified: Secondary | ICD-10-CM | POA: Diagnosis present

## 2018-10-10 DIAGNOSIS — Z833 Family history of diabetes mellitus: Secondary | ICD-10-CM

## 2018-10-10 DIAGNOSIS — M1990 Unspecified osteoarthritis, unspecified site: Secondary | ICD-10-CM | POA: Diagnosis present

## 2018-10-10 DIAGNOSIS — F17210 Nicotine dependence, cigarettes, uncomplicated: Secondary | ICD-10-CM | POA: Diagnosis present

## 2018-10-10 DIAGNOSIS — R112 Nausea with vomiting, unspecified: Secondary | ICD-10-CM

## 2018-10-10 DIAGNOSIS — E7800 Pure hypercholesterolemia, unspecified: Secondary | ICD-10-CM | POA: Diagnosis present

## 2018-10-10 DIAGNOSIS — G43909 Migraine, unspecified, not intractable, without status migrainosus: Secondary | ICD-10-CM | POA: Diagnosis present

## 2018-10-10 DIAGNOSIS — E785 Hyperlipidemia, unspecified: Secondary | ICD-10-CM | POA: Diagnosis present

## 2018-10-10 DIAGNOSIS — E119 Type 2 diabetes mellitus without complications: Secondary | ICD-10-CM

## 2018-10-10 DIAGNOSIS — F419 Anxiety disorder, unspecified: Secondary | ICD-10-CM | POA: Diagnosis present

## 2018-10-10 DIAGNOSIS — Z880 Allergy status to penicillin: Secondary | ICD-10-CM

## 2018-10-10 DIAGNOSIS — Z8673 Personal history of transient ischemic attack (TIA), and cerebral infarction without residual deficits: Secondary | ICD-10-CM

## 2018-10-10 DIAGNOSIS — J189 Pneumonia, unspecified organism: Secondary | ICD-10-CM

## 2018-10-10 DIAGNOSIS — K219 Gastro-esophageal reflux disease without esophagitis: Secondary | ICD-10-CM | POA: Diagnosis present

## 2018-10-10 DIAGNOSIS — J13 Pneumonia due to Streptococcus pneumoniae: Principal | ICD-10-CM | POA: Diagnosis present

## 2018-10-10 DIAGNOSIS — Z791 Long term (current) use of non-steroidal anti-inflammatories (NSAID): Secondary | ICD-10-CM

## 2018-10-10 DIAGNOSIS — Z79899 Other long term (current) drug therapy: Secondary | ICD-10-CM

## 2018-10-10 DIAGNOSIS — J440 Chronic obstructive pulmonary disease with acute lower respiratory infection: Secondary | ICD-10-CM | POA: Diagnosis present

## 2018-10-10 DIAGNOSIS — Z8249 Family history of ischemic heart disease and other diseases of the circulatory system: Secondary | ICD-10-CM

## 2018-10-10 DIAGNOSIS — B182 Chronic viral hepatitis C: Secondary | ICD-10-CM | POA: Diagnosis present

## 2018-10-10 DIAGNOSIS — Z818 Family history of other mental and behavioral disorders: Secondary | ICD-10-CM

## 2018-10-10 DIAGNOSIS — Z823 Family history of stroke: Secondary | ICD-10-CM

## 2018-10-10 DIAGNOSIS — Z7902 Long term (current) use of antithrombotics/antiplatelets: Secondary | ICD-10-CM

## 2018-10-10 DIAGNOSIS — Z885 Allergy status to narcotic agent status: Secondary | ICD-10-CM

## 2018-10-10 DIAGNOSIS — E872 Acidosis: Secondary | ICD-10-CM | POA: Diagnosis present

## 2018-10-10 LAB — URINALYSIS, COMPLETE (UACMP) WITH MICROSCOPIC
BACTERIA UA: NONE SEEN
Glucose, UA: NEGATIVE mg/dL
Hgb urine dipstick: NEGATIVE
KETONES UR: NEGATIVE mg/dL
Nitrite: NEGATIVE
PH: 5 (ref 5.0–8.0)
Protein, ur: NEGATIVE mg/dL
Specific Gravity, Urine: 1.023 (ref 1.005–1.030)

## 2018-10-10 LAB — COMPREHENSIVE METABOLIC PANEL
ALBUMIN: 2.9 g/dL — AB (ref 3.5–5.0)
ALT: 30 U/L (ref 0–44)
AST: 51 U/L — AB (ref 15–41)
Alkaline Phosphatase: 51 U/L (ref 38–126)
Anion gap: 12 (ref 5–15)
BILIRUBIN TOTAL: 0.9 mg/dL (ref 0.3–1.2)
BUN: 33 mg/dL — AB (ref 8–23)
CO2: 22 mmol/L (ref 22–32)
Calcium: 8 mg/dL — ABNORMAL LOW (ref 8.9–10.3)
Chloride: 101 mmol/L (ref 98–111)
Creatinine, Ser: 2.32 mg/dL — ABNORMAL HIGH (ref 0.44–1.00)
GFR calc Af Amer: 25 mL/min — ABNORMAL LOW (ref >60)
GFR calc non Af Amer: 21 mL/min — ABNORMAL LOW (ref >60)
GLUCOSE: 122 mg/dL — AB (ref 70–99)
POTASSIUM: 3.8 mmol/L (ref 3.5–5.1)
SODIUM: 135 mmol/L (ref 135–145)
Total Protein: 6.6 g/dL (ref 6.5–8.1)

## 2018-10-10 LAB — CBC
HEMATOCRIT: 37 % (ref 36.0–46.0)
Hemoglobin: 12.3 g/dL (ref 12.0–15.0)
MCH: 30.9 pg (ref 26.0–34.0)
MCHC: 33.2 g/dL (ref 30.0–36.0)
MCV: 93 fL (ref 80.0–100.0)
Platelets: 300 10*3/uL (ref 150–400)
RBC: 3.98 MIL/uL (ref 3.87–5.11)
RDW: 14.6 % (ref 11.5–15.5)
WBC: 12.5 10*3/uL — ABNORMAL HIGH (ref 4.0–10.5)
nRBC: 0 % (ref 0.0–0.2)

## 2018-10-10 LAB — LIPASE, BLOOD: Lipase: 26 U/L (ref 11–51)

## 2018-10-10 LAB — TROPONIN I: TROPONIN I: 0.04 ng/mL — AB (ref <0.03)

## 2018-10-10 MED ORDER — AZITHROMYCIN 250 MG PO TABS: ORAL_TABLET | ORAL | 0 refills | Status: DC

## 2018-10-10 MED ORDER — SODIUM CHLORIDE 0.9 % IV BOLUS: 1000.0000 mL | Freq: Once | INTRAVENOUS | Status: DC

## 2018-10-10 MED ORDER — ONDANSETRON HCL 4 MG/2ML IJ SOLN
4.0000 mg | Freq: Once | INTRAMUSCULAR | Status: DC
  Filled 2018-10-10: qty 2

## 2018-10-10 MED ORDER — ONDANSETRON 4 MG PO TBDP: 4.0000 mg | ORAL_TABLET | Freq: Three times a day (TID) | ORAL | 0 refills | Status: DC | PRN

## 2018-10-10 NOTE — ED Triage Notes (Signed)
Pt states that she has been vomiting today, states that it is causing her to feel weak and she fell at home and has abrasions noted to her left forearm, pt reports that she called a cab to bring her, and would like a glass of ice water

## 2018-10-10 NOTE — ED Notes (Signed)
Pt encouraged when goes home to drink lots of water since will not let us do IV. Pt also refusing flu swab and states "that has to go up too far".  Will not let RN swab her.  Dr Kerman Passey notified.

## 2018-10-10 NOTE — Discharge Instructions (Signed)
As we discussed please drink plenty of fluids over the next several days.  Please take nausea medication as needed for nausea, please complete your course of antibiotics.  Please follow-up with your primary care doctor for recheck/reevaluation.  Return to the emergency department for any worsening symptoms such as return of vomiting, weakness, difficulty breathing, or any other symptom personally concerning to yourself.

## 2018-10-10 NOTE — ED Notes (Signed)
Pt refusing IV 

## 2018-10-10 NOTE — ED Notes (Signed)
Called Tammy Armstrong pts ride and he cannot come get pt will call her a cab. Pt informed and first nurse aware.  Pt in lobby to wait on cab. VSS

## 2018-10-10 NOTE — ED Notes (Signed)
Patient transported to CT 

## 2018-10-10 NOTE — ED Notes (Signed)
Dr Kerman Passey notified of critical troponin results.

## 2018-10-10 NOTE — ED Notes (Signed)
Pt returned from CT/xray. Still refusing IV. Dr Kerman Passey notified.

## 2018-10-10 NOTE — ED Provider Notes (Signed)
[facility] Emergency Department Provider Note  Time seen: 3:38 PM  I have reviewed the triage vital signs and the nursing notes.   HISTORY  Chief Complaint Emesis; Weakness; and Fall   HPI Tammy Armstrong is a 68 y.o. female with a past medical history of anxiety, CAD, COPD, diabetes, hypertension, hyperlipidemia, CVA, presents to the emergency department with nausea vomiting, generalized fatigue and weakness.  According to the patient over the past 3 to 4 days she has had a cough, fever at home, nausea vomiting and diarrhea.  Patient states today vomiting and diarrhea resolved last night she has not had any more today.  Continues to have an occasional cough although denies sputum production.  Patient states she came to the emergency department today mostly due to weakness and fatigue.  Patient does states she had a fall yesterday but denies hitting her head or LOC.  Also states moderate left-sided abdominal pain, but only when coughing.   Past Medical History:  Diagnosis Date  . Anxiety   . Arthritis   . Carotid artery occlusion   . Cigarette smoker one half pack a day or less   . COPD (chronic obstructive pulmonary disease) ([place]) 07/04/2011  . Depression   . Diabetes mellitus   . Diabetes mellitus 07/04/2011  . GERD (gastroesophageal reflux disease)   . Hepatitis C   . Hypercholesterolemia   . Hypertension   . Migraines   . Pancreatitis   . 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 Respiratory failure ([place]) 07/04/2011  . Seizure ([place]) 07/02/2011  . Shortness of breath dyspnea   . Stroke [facility])     Patient Active Problem List   Diagnosis Date Noted  . Tobacco use disorder 07/13/2016  . Carotid aneurysm, left ([place]) 07/13/2016  . Right upper extremity numbness   . TIA (transient ischemic attack) 06/27/2016  . Carotid stenosis 04/08/2015  . Migraine 04/06/2015  . Dementia with behavioral disturbance ([place]) 01/19/2015  . Diabetes ([place]) 07/04/2011  .  Anxiety 07/04/2011  . COPD (chronic obstructive pulmonary disease) ([place]) 07/04/2011  . Purulent bronchitis ([place]) 07/04/2011  . 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 ABDOMINAL HYSTERECTOMY    . APPENDECTOMY    . BACK SURGERY  2012   cyst removed from spine , [place]  . CHOLECYSTECTOMY    . ENDARTERECTOMY Left 05/18/2015   Procedure: ENDARTERECTOMY CAROTID;  Surgeon: Algernon Huxley, MD;  Location: [facility] ORS;  Service: Vascular;  Laterality: Left;    Prior to Admission medications   Medication Sig Start Date End Date Taking? Authorizing Provider  acetaminophen (TYLENOL) 500 MG tablet Take 500 mg by mouth every 6 (six) hours as needed.    [provider]  albuterol (PROVENTIL HFA;VENTOLIN HFA) 108 (90 BASE) MCG/ACT inhaler Inhale 2 puffs into the lungs every 6 (six) hours as needed for wheezing or shortness of breath.     [provider]  ALPRAZolam Duanne Moron) 0.5 MG tablet Take 1 tablet 3 times daily for 4 days, then 1 tablet twice daily for 4 days, then 1 tablet daily for 4 days, then 1 tablet every other day until gone 04/02/16   Earleen Newport, MD  ASPIRIN LOW DOSE 81 MG EC tablet TAKE ONE TABLET BY MOUTH EVERY DAY. *DO NOT CRUSH* 03/08/17   Algernon Huxley, MD  atorvastatin (LIPITOR) 40 MG tablet Take by mouth. 08/16/16 08/16/17  [provider]  budesonide-formoterol (SYMBICORT) 80-4.5 MCG/ACT inhaler  Inhale 2 puffs into the lungs 2 (two) times daily. 04/08/15   Gladstone Lighter, MD  clopidogrel (PLAVIX) 75 MG tablet TAKE ONE TABLET BY MOUTH EVERY DAY WITH BREAKFAST 11/13/16   Algernon Huxley, MD  docusate sodium (COLACE) 100 MG capsule Take 100 mg by mouth daily.     [provider]  DULoxetine (CYMBALTA) 60 MG capsule Take 60 mg by mouth 2 (two) times daily.    [provider]  fluticasone furoate-vilanterol (BREO ELLIPTA) 100-25 MCG/INH AEPB Inhale 1 puff into the lungs daily.    [provider]  gabapentin (NEURONTIN) 300 MG capsule Take 300 mg by mouth 4 (four) times daily.    [provider]  glipiZIDE (GLUCOTROL XL) 5 MG 24 hr tablet Take 1 tablet (5 mg total) by mouth daily. 04/02/16   Earleen Newport, MD  hydrOXYzine (ATARAX/VISTARIL) 25 MG tablet  09/21/16   [provider]  ibuprofen (ADVIL,MOTRIN) 200 MG tablet Take 200 mg by mouth every 6 (six) hours as needed for headache or moderate pain.    [provider]  lisinopril (PRINIVIL,ZESTRIL) 20 MG tablet Take 1 tablet (20 mg total) by mouth daily. 04/02/16   Earleen Newport, MD  meloxicam (MOBIC) 7.5 MG tablet Take 7.5 mg by mouth 2 (two) times daily.    [provider]  metFORMIN (GLUCOPHAGE-XR) 500 MG 24 hr tablet Take 500 mg by mouth 2 (two) times daily.    [provider]  metoprolol (TOPROL-XL) 50 MG 24 hr tablet Take 50 mg by mouth daily.      [provider]  ondansetron (ZOFRAN) 4 MG tablet Take 1 tablet (4 mg total) by mouth daily as needed for nausea or vomiting. 08/03/15   Earleen Newport, MD  pantoprazole (PROTONIX) 40 MG tablet Take 40 mg by mouth 2 (two) times daily.    [provider]  QUEtiapine (SEROQUEL) 25 MG tablet Take 25 mg by mouth at bedtime.    [provider]  traZODone (DESYREL) 50 MG tablet Take 50 mg by mouth at bedtime.    [provider]  ziprasidone (GEODON) 40 MG capsule Take 40 mg by mouth 2 (two) times daily with a meal.     [provider]    Allergies  Allergen Reactions  . Promethazine Hcl Other (See Comments)    Reaction:  Impaired speech   . Ciprofloxacin Other (See Comments)    Medication is contraindicated with some of pts other meds.    . Doxycycline Nausea And Vomiting  . Imitrex [Sumatriptan Base] Other (See Comments)    Pt states that it "puts her out of this world".    . Morphine And Related Nausea And Vomiting  . Penicillins Nausea And Vomiting and Other (See  Comments)    Pt is unable to answer additional questions about this medication.    . Zantac [Ranitidine Hcl] Other (See Comments)    Reaction:  Unknown   . Ketorolac Tromethamine Rash    Family History  Problem Relation Age of Onset  . Hypertension Mother   . Heart attack Mother   . Diabetes Mother   . Anxiety disorder Mother   . Stroke Mother   . Diabetes Father   . Diabetes Brother   . Alcohol abuse Unknown        siblings  . Hypertension Brother   . Breast cancer Neg Hx     Social History Social History   Tobacco Use  . Smoking  status: Current Every Day Smoker    Packs/day: 1.00    Years: 52.00    Pack years: 52.00    Types: Cigarettes  . Smokeless tobacco: Never Used  Substance Use Topics  . Alcohol use: No  . Drug use: No    Review of Systems Constitutional: Negative for fever. Cardiovascular: Negative for chest pain. Respiratory: Negative for shortness of breath.  Occasional cough. Gastrointestinal: Moderate left-sided abdominal discomfort, but only when coughing.  Nausea vomiting diarrhea over the past 3 days but resolved yesterday. Genitourinary: Negative for urinary compaints Musculoskeletal: Mild left forearm discomfort after a fall yesterday. Skin: Small abrasions left forearm. Neurological: Negative for headache All other ROS negative  ____________________________________________   PHYSICAL EXAM:  VITAL SIGNS: ED Triage Vitals  Enc Vitals Group     BP 10/10/18 1236 (!) 108/54     Pulse Rate 10/10/18 1236 87     Resp 10/10/18 1236 18     Temp 10/10/18 1236 98.8 F (37.1 C)     Temp Source 10/10/18 1236 Oral     SpO2 10/10/18 1236 94 %     Weight 10/10/18 1258 161 lb (73 kg)     Height 10/10/18 1258 5\' 2"  (1.575 m)     Head Circumference --      Peak Flow --      Pain Score 10/10/18 1258 0     Pain Loc --      Pain Edu? --      Excl. in [place]? --    Constitutional: Alert and oriented. Well appearing and in no distress. Eyes: Normal  exam ENT   Head: Normocephalic and atraumatic.   Mouth/Throat: Mucous membranes are moist. Cardiovascular: Normal rate, regular rhythm.  Respiratory: Normal respiratory effort without tachypnea nor retractions. Breath sounds are clear.  Occasional cough during examination. Gastrointestinal: Soft, minimal left lower quadrant tenderness palpation otherwise benign abdomen without rebound guarding or distention. Musculoskeletal: Nontender with normal range of motion in all extremities. Neurologic:  Normal speech and language. No gross focal neurologic deficits  Skin:  Skin is warm, dry and intact.  Psychiatric: Mood and affect are normal.   ____________________________________________   RADIOLOGY  IMPRESSION: Micronodular opacities in the right lung base are new since the prior CT scan and compatible inflammatory or infectious process, possibly  No acute abnormality within the abdomen or pelvis. Negative for urinary tract stone.  Cirrhotic liver.  Calcific aortic and coronary atherosclerosis.  ____________________________________________   INITIAL IMPRESSION / ASSESSMENT AND PLAN / ED COURSE  Pertinent labs & imaging results that were available during my care of the patient were reviewed by me and considered in my medical decision making (see chart for details).  Patient presents to the emergency department for 3 days of nausea vomiting, diarrhea, cough, and fever.  Currently afebrile with reassuring vitals.  Patient does have dry mucous membranes.  Differential would include gastroenteritis, viral illness, pneumonia, diverticulitis, colitis.  We will check labs, CT scan of the abdomen and chest x-ray.  Patient's labs have resulted showing acute renal insufficiency with a baseline creatinine around 1.0 currently around 2.3.  We will  IV hydrate.  Imaging is pending.  I have added on an influenza swab as well.  Patient is refusing IV, states they hurt too much and she  bruises too easily.  I discussed with the patient her renal insufficiency and the need for IV fluids as we are considering possible admission.  Patient states she does not believe she needs to  be admitted to the hospital, states she drinks plenty at home and is no longer vomiting or having diarrhea and believe she can orally hydrate.  Patient is agreeable to allow [place] to do the CT scan and chest x-ray.  We will do a CT scan without contrast.  We will also do an influenza swab.  Continue to closely monitor.  X-ray shows micronodular opacities in the lung base.  Patient is now refusing flu swab as well.  We will discharge the patient home with a short course of antibiotics, nausea medication.  Patient will follow-up with her primary care doctor.  I discussed very strict return precautions with the patient as well as significant need to orally hydrate.  Patient agreeable plan of care. ____________________________________________   FINAL CLINICAL IMPRESSION(S) / ED DIAGNOSES  Weakness Nausea vomiting diarrhea Acute renal insufficiency    Harvest Dark, MD 10/10/18 1623

## 2018-10-12 ENCOUNTER — Other Ambulatory Visit: Payer: Self-pay

## 2018-10-12 ENCOUNTER — Inpatient Hospital Stay
Admission: EM | Admit: 2018-10-12 | Discharge: 2018-10-14 | DRG: 194 | Disposition: A | Discharge status: Home Health | Payer: Medicare Other | Attending: Internal Medicine | Admitting: Internal Medicine

## 2018-10-12 ENCOUNTER — Emergency Department: Payer: Medicare Other

## 2018-10-12 DIAGNOSIS — E1151 Type 2 diabetes mellitus with diabetic peripheral angiopathy without gangrene: Secondary | ICD-10-CM | POA: Diagnosis present

## 2018-10-12 DIAGNOSIS — M1990 Unspecified osteoarthritis, unspecified site: Secondary | ICD-10-CM | POA: Diagnosis present

## 2018-10-12 DIAGNOSIS — K219 Gastro-esophageal reflux disease without esophagitis: Secondary | ICD-10-CM | POA: Diagnosis present

## 2018-10-12 DIAGNOSIS — Z8249 Family history of ischemic heart disease and other diseases of the circulatory system: Secondary | ICD-10-CM | POA: Diagnosis not present

## 2018-10-12 DIAGNOSIS — J440 Chronic obstructive pulmonary disease with acute lower respiratory infection: Secondary | ICD-10-CM | POA: Diagnosis present

## 2018-10-12 DIAGNOSIS — Z885 Allergy status to narcotic agent status: Secondary | ICD-10-CM | POA: Diagnosis not present

## 2018-10-12 DIAGNOSIS — F17210 Nicotine dependence, cigarettes, uncomplicated: Secondary | ICD-10-CM | POA: Diagnosis present

## 2018-10-12 DIAGNOSIS — Z888 Allergy status to other drugs, medicaments and biological substances status: Secondary | ICD-10-CM | POA: Diagnosis not present

## 2018-10-12 DIAGNOSIS — J13 Pneumonia due to Streptococcus pneumoniae: Secondary | ICD-10-CM | POA: Diagnosis present

## 2018-10-12 DIAGNOSIS — K7460 Unspecified cirrhosis of liver: Secondary | ICD-10-CM | POA: Diagnosis present

## 2018-10-12 DIAGNOSIS — Z7984 Long term (current) use of oral hypoglycemic drugs: Secondary | ICD-10-CM | POA: Diagnosis not present

## 2018-10-12 DIAGNOSIS — J189 Pneumonia, unspecified organism: Secondary | ICD-10-CM | POA: Diagnosis present

## 2018-10-12 DIAGNOSIS — Z881 Allergy status to other antibiotic agents status: Secondary | ICD-10-CM | POA: Diagnosis not present

## 2018-10-12 DIAGNOSIS — E7800 Pure hypercholesterolemia, unspecified: Secondary | ICD-10-CM | POA: Diagnosis present

## 2018-10-12 DIAGNOSIS — I10 Essential (primary) hypertension: Secondary | ICD-10-CM | POA: Diagnosis present

## 2018-10-12 DIAGNOSIS — E872 Acidosis: Secondary | ICD-10-CM | POA: Diagnosis present

## 2018-10-12 DIAGNOSIS — Z791 Long term (current) use of non-steroidal anti-inflammatories (NSAID): Secondary | ICD-10-CM | POA: Diagnosis not present

## 2018-10-12 DIAGNOSIS — Z7902 Long term (current) use of antithrombotics/antiplatelets: Secondary | ICD-10-CM | POA: Diagnosis not present

## 2018-10-12 DIAGNOSIS — Z823 Family history of stroke: Secondary | ICD-10-CM | POA: Diagnosis not present

## 2018-10-12 DIAGNOSIS — I2510 Atherosclerotic heart disease of native coronary artery without angina pectoris: Secondary | ICD-10-CM | POA: Diagnosis present

## 2018-10-12 DIAGNOSIS — Z818 Family history of other mental and behavioral disorders: Secondary | ICD-10-CM | POA: Diagnosis not present

## 2018-10-12 DIAGNOSIS — G43909 Migraine, unspecified, not intractable, without status migrainosus: Secondary | ICD-10-CM | POA: Diagnosis present

## 2018-10-12 DIAGNOSIS — Z880 Allergy status to penicillin: Secondary | ICD-10-CM | POA: Diagnosis not present

## 2018-10-12 DIAGNOSIS — Z833 Family history of diabetes mellitus: Secondary | ICD-10-CM | POA: Diagnosis not present

## 2018-10-12 DIAGNOSIS — Z8673 Personal history of transient ischemic attack (TIA), and cerebral infarction without residual deficits: Secondary | ICD-10-CM | POA: Diagnosis not present

## 2018-10-12 LAB — CBC WITH DIFFERENTIAL/PLATELET
Abs Immature Granulocytes: 0.34 10*3/uL — ABNORMAL HIGH (ref 0.00–0.07)
Basophils Absolute: 0.1 10*3/uL (ref 0.0–0.1)
Basophils Relative: 0 %
EOS PCT: 1 %
Eosinophils Absolute: 0.1 10*3/uL (ref 0.0–0.5)
HEMATOCRIT: 37.9 % (ref 36.0–46.0)
HEMOGLOBIN: 12.6 g/dL (ref 12.0–15.0)
Immature Granulocytes: 3 %
LYMPHS ABS: 1.8 10*3/uL (ref 0.7–4.0)
LYMPHS PCT: 14 %
MCH: 30.5 pg (ref 26.0–34.0)
MCHC: 33.2 g/dL (ref 30.0–36.0)
MCV: 91.8 fL (ref 80.0–100.0)
MONO ABS: 1.1 10*3/uL — AB (ref 0.1–1.0)
Monocytes Relative: 8 %
Neutro Abs: 9.8 10*3/uL — ABNORMAL HIGH (ref 1.7–7.7)
Neutrophils Relative %: 74 %
Platelets: 393 10*3/uL (ref 150–400)
RBC: 4.13 MIL/uL (ref 3.87–5.11)
RDW: 14 % (ref 11.5–15.5)
WBC: 13.1 10*3/uL — ABNORMAL HIGH (ref 4.0–10.5)
nRBC: 0 % (ref 0.0–0.2)

## 2018-10-12 LAB — INFLUENZA PANEL BY PCR (TYPE A & B)
Influenza A By PCR: NEGATIVE
Influenza B By PCR: NEGATIVE

## 2018-10-12 LAB — COMPREHENSIVE METABOLIC PANEL
ALT: 22 U/L (ref 0–44)
AST: 25 U/L (ref 15–41)
Albumin: 2.9 g/dL — ABNORMAL LOW (ref 3.5–5.0)
Alkaline Phosphatase: 59 U/L (ref 38–126)
Anion gap: 11 (ref 5–15)
BUN: 24 mg/dL — ABNORMAL HIGH (ref 8–23)
CO2: 23 mmol/L (ref 22–32)
Calcium: 8.3 mg/dL — ABNORMAL LOW (ref 8.9–10.3)
Chloride: 102 mmol/L (ref 98–111)
Creatinine, Ser: 1.1 mg/dL — ABNORMAL HIGH (ref 0.44–1.00)
GFR calc Af Amer: >60 mL/min (ref >60)
GFR calc non Af Amer: 52 mL/min — ABNORMAL LOW (ref >60)
GLUCOSE: 254 mg/dL — AB (ref 70–99)
Potassium: 4.5 mmol/L (ref 3.5–5.1)
Sodium: 136 mmol/L (ref 135–145)
Total Bilirubin: 1.3 mg/dL — ABNORMAL HIGH (ref 0.3–1.2)
Total Protein: 7.1 g/dL (ref 6.5–8.1)

## 2018-10-12 LAB — LACTIC ACID, PLASMA: LACTIC ACID, VENOUS: 2.3 mmol/L — AB (ref 0.5–1.9)

## 2018-10-12 LAB — GLUCOSE, CAPILLARY
Glucose-Capillary: 64 mg/dL — ABNORMAL LOW (ref 70–99)
Glucose-Capillary: 78 mg/dL (ref 70–99)
Glucose-Capillary: 67 mg/dL — ABNORMAL LOW (ref 70–99)
Glucose-Capillary: 99 mg/dL (ref 70–99)

## 2018-10-12 LAB — PROCALCITONIN: Procalcitonin: 0.12 ng/mL

## 2018-10-12 MED ORDER — METOPROLOL SUCCINATE ER 50 MG PO TB24
50.0000 mg | ORAL_TABLET | Freq: Every day | ORAL | Status: DC
  Administered 2018-10-12 – 2018-10-14 (×3): 50 mg via ORAL
  Filled 2018-10-12 (×3): qty 1

## 2018-10-12 MED ORDER — GABAPENTIN 300 MG PO CAPS
300.0000 mg | ORAL_CAPSULE | Freq: Four times a day (QID) | ORAL | Status: DC
  Administered 2018-10-12 – 2018-10-13 (×3): 300 mg via ORAL
  Filled 2018-10-12 (×3): qty 1

## 2018-10-12 MED ORDER — IPRATROPIUM-ALBUTEROL 0.5-2.5 (3) MG/3ML IN SOLN: 3.0000 mL | Freq: Four times a day (QID) | RESPIRATORY_TRACT | Status: DC | PRN

## 2018-10-12 MED ORDER — ASPIRIN EC 81 MG PO TBEC
81.0000 mg | DELAYED_RELEASE_TABLET | Freq: Every day | ORAL | Status: DC
  Administered 2018-10-12 – 2018-10-14 (×3): 81 mg via ORAL
  Filled 2018-10-12 (×3): qty 1

## 2018-10-12 MED ORDER — CLOPIDOGREL BISULFATE 75 MG PO TABS
75.0000 mg | ORAL_TABLET | Freq: Every day | ORAL | Status: DC
  Administered 2018-10-12 – 2018-10-14 (×3): 75 mg via ORAL
  Filled 2018-10-12 (×3): qty 1

## 2018-10-12 MED ORDER — LISINOPRIL 20 MG PO TABS
20.0000 mg | ORAL_TABLET | Freq: Every day | ORAL | Status: DC
  Administered 2018-10-13 – 2018-10-14 (×2): 20 mg via ORAL
  Filled 2018-10-12 (×2): qty 1

## 2018-10-12 MED ORDER — GLIPIZIDE ER 5 MG PO TB24
5.0000 mg | ORAL_TABLET | Freq: Every day | ORAL | Status: DC
  Administered 2018-10-13: 5 mg via ORAL
  Filled 2018-10-12 (×2): qty 1

## 2018-10-12 MED ORDER — TRAZODONE HCL 50 MG PO TABS
50.0000 mg | ORAL_TABLET | Freq: Every day | ORAL | Status: DC
  Administered 2018-10-12 – 2018-10-13 (×2): 50 mg via ORAL
  Filled 2018-10-12 (×2): qty 1

## 2018-10-12 MED ORDER — METFORMIN HCL ER 500 MG PO TB24
500.0000 mg | ORAL_TABLET | Freq: Two times a day (BID) | ORAL | Status: DC
  Administered 2018-10-13 – 2018-10-14 (×3): 500 mg via ORAL
  Filled 2018-10-12 (×5): qty 1

## 2018-10-12 MED ORDER — QUETIAPINE FUMARATE 25 MG PO TABS
25.0000 mg | ORAL_TABLET | Freq: Every day | ORAL | Status: DC
  Administered 2018-10-12 – 2018-10-13 (×2): 25 mg via ORAL
  Filled 2018-10-12 (×2): qty 1

## 2018-10-12 MED ORDER — INSULIN ASPART 100 UNIT/ML ~~LOC~~ SOLN
0.0000 [IU] | Freq: Three times a day (TID) | SUBCUTANEOUS | Status: DC
  Administered 2018-10-13: 12:00:00 1 [IU] via SUBCUTANEOUS
  Filled 2018-10-12: qty 1

## 2018-10-12 MED ORDER — SODIUM CHLORIDE 0.9 % IV BOLUS
1000.0000 mL | Freq: Once | INTRAVENOUS | Status: AC
  Administered 2018-10-12: 1000 mL via INTRAVENOUS

## 2018-10-12 MED ORDER — ONDANSETRON HCL 4 MG/2ML IJ SOLN
4.0000 mg | Freq: Four times a day (QID) | INTRAMUSCULAR | Status: DC | PRN
  Administered 2018-10-13: 4 mg via INTRAVENOUS
  Filled 2018-10-12: qty 2

## 2018-10-12 MED ORDER — SODIUM CHLORIDE 0.9 % IV SOLN
INTRAVENOUS | Status: DC
  Administered 2018-10-12: 14:00:00 75 mL/h via INTRAVENOUS
  Administered 2018-10-13: 05:00:00 via INTRAVENOUS

## 2018-10-12 MED ORDER — ACETAMINOPHEN 325 MG PO TABS
650.0000 mg | ORAL_TABLET | Freq: Four times a day (QID) | ORAL | Status: DC | PRN
  Administered 2018-10-12: 650 mg via ORAL
  Filled 2018-10-12: qty 2

## 2018-10-12 MED ORDER — DULOXETINE HCL 30 MG PO CPEP
60.0000 mg | ORAL_CAPSULE | Freq: Two times a day (BID) | ORAL | Status: DC
  Administered 2018-10-12 – 2018-10-14 (×4): 60 mg via ORAL
  Filled 2018-10-12 (×4): qty 2

## 2018-10-12 MED ORDER — GUAIFENESIN ER 600 MG PO TB12
600.0000 mg | ORAL_TABLET | Freq: Two times a day (BID) | ORAL | Status: DC
  Administered 2018-10-12 – 2018-10-13 (×4): 600 mg via ORAL
  Filled 2018-10-12 (×5): qty 1

## 2018-10-12 MED ORDER — MELOXICAM 7.5 MG PO TABS
7.5000 mg | ORAL_TABLET | Freq: Two times a day (BID) | ORAL | Status: DC
  Administered 2018-10-12 – 2018-10-14 (×4): 7.5 mg via ORAL
  Filled 2018-10-12 (×6): qty 1

## 2018-10-12 MED ORDER — PANTOPRAZOLE SODIUM 40 MG PO TBEC
40.0000 mg | DELAYED_RELEASE_TABLET | Freq: Two times a day (BID) | ORAL | Status: DC
  Administered 2018-10-12 – 2018-10-14 (×4): 40 mg via ORAL
  Filled 2018-10-12 (×4): qty 1

## 2018-10-12 MED ORDER — ZIPRASIDONE HCL 40 MG PO CAPS
40.0000 mg | ORAL_CAPSULE | Freq: Two times a day (BID) | ORAL | Status: DC
  Administered 2018-10-12 – 2018-10-14 (×4): 40 mg via ORAL
  Filled 2018-10-12 (×6): qty 1

## 2018-10-12 MED ORDER — DOCUSATE SODIUM 100 MG PO CAPS
100.0000 mg | ORAL_CAPSULE | Freq: Every day | ORAL | Status: DC
  Filled 2018-10-12 (×2): qty 1

## 2018-10-12 MED ORDER — SODIUM CHLORIDE 0.9 % IV SOLN
500.0000 mg | INTRAVENOUS | Status: DC
  Administered 2018-10-13 – 2018-10-14 (×2): 500 mg via INTRAVENOUS
  Filled 2018-10-12 (×2): qty 500

## 2018-10-12 MED ORDER — ONDANSETRON HCL 4 MG/2ML IJ SOLN
4.0000 mg | Freq: Once | INTRAMUSCULAR | Status: AC
  Administered 2018-10-12: 4 mg via INTRAVENOUS
  Filled 2018-10-12: qty 2

## 2018-10-12 MED ORDER — SODIUM CHLORIDE 0.9 % IV SOLN
1.0000 g | INTRAVENOUS | Status: DC
  Administered 2018-10-13 – 2018-10-14 (×2): 1 g via INTRAVENOUS
  Filled 2018-10-12 (×2): qty 1

## 2018-10-12 MED ORDER — GUAIFENESIN-DM 100-10 MG/5ML PO SYRP
5.0000 mL | ORAL_SOLUTION | ORAL | Status: DC | PRN
  Administered 2018-10-12: 5 mL via ORAL
  Filled 2018-10-12 (×2): qty 5

## 2018-10-12 MED ORDER — ENOXAPARIN SODIUM 40 MG/0.4ML ~~LOC~~ SOLN
40.0000 mg | SUBCUTANEOUS | Status: DC
  Administered 2018-10-12 – 2018-10-13 (×2): 40 mg via SUBCUTANEOUS
  Filled 2018-10-12 (×2): qty 0.4

## 2018-10-12 MED ORDER — IBUPROFEN 400 MG PO TABS: 200.0000 mg | ORAL_TABLET | Freq: Four times a day (QID) | ORAL | Status: DC | PRN

## 2018-10-12 MED ORDER — ATORVASTATIN CALCIUM 20 MG PO TABS
40.0000 mg | ORAL_TABLET | Freq: Every day | ORAL | Status: DC
  Administered 2018-10-12 – 2018-10-13 (×2): 40 mg via ORAL
  Filled 2018-10-12 (×2): qty 2

## 2018-10-12 MED ORDER — ALPRAZOLAM 0.25 MG PO TABS: 0.2500 mg | ORAL_TABLET | Freq: Two times a day (BID) | ORAL | Status: DC | PRN

## 2018-10-12 MED ORDER — AZITHROMYCIN 500 MG PO TABS
500.0000 mg | ORAL_TABLET | Freq: Once | ORAL | Status: AC
  Administered 2018-10-12: 500 mg via ORAL
  Filled 2018-10-12: qty 1

## 2018-10-12 MED ORDER — SODIUM CHLORIDE 0.9 % IV SOLN
1.0000 g | Freq: Once | INTRAVENOUS | Status: AC
  Administered 2018-10-12: 1 g via INTRAVENOUS
  Filled 2018-10-12: qty 10

## 2018-10-12 MED ORDER — ACETAMINOPHEN 500 MG PO TABS: 500.0000 mg | ORAL_TABLET | Freq: Four times a day (QID) | ORAL | Status: DC | PRN

## 2018-10-12 NOTE — ED Notes (Signed)
ED TO INPATIENT HANDOFF REPORT  ED Nurse Name and Phone #: [place]  S Name/Age/Gender Tammy Armstrong 68 y.o. female Room/Bed: ED18A/ED18A  Code Status   Code Status: Prior  Home/SNF/Other Home Patient oriented to: self, place, time and situation Is this baseline? Yes   Triage Complete: Triage complete  Chief Complaint Does not feel right  Triage Note Pt presents via POV c/o being "worried and scared". Dx with pneumonia 2 days ago per EMS. EMS reports not taking abx as prescribed. Pt denies worsening symptoms.    Allergies Allergies  Allergen Reactions  . Promethazine Hcl Other (See Comments)    Reaction:  Impaired speech   . Ciprofloxacin Other (See Comments)    Medication is contraindicated with some of pts other meds.    . Doxycycline Nausea And Vomiting  . Imitrex [Sumatriptan Base] Other (See Comments)    Pt states that it "puts her out of this world".    . Morphine And Related Nausea And Vomiting  . Penicillins Nausea And Vomiting and Other (See Comments)    Pt is unable to answer additional questions about this medication.    . Zantac [Ranitidine Hcl] Other (See Comments)    Reaction:  Unknown   . Ketorolac Tromethamine Rash    Level of Care/Admitting Diagnosis ED Disposition    ED Disposition Condition [facility]: [place] [[id number]]  Level of Care: Med-Surg [16]  Diagnosis: PNA (pneumonia) [[id number]]  Admitting Physician: Dustin Flock [[id number]]  Attending Physician: Dustin Flock [[id number]]  Estimated length of stay: past midnight tomorrow  Certification:: I certify this patient will need inpatient services for at least 2 midnights  PT Class (Do Not Modify): Inpatient [101]  PT Acc Code (Do Not Modify): Private [1]       B Medical/Surgery History Past Medical History:  Diagnosis Date  . Anxiety   . Arthritis   . Carotid artery occlusion   . Cigarette smoker one half pack a day or less   . COPD  (chronic obstructive pulmonary disease) ([place]) 07/04/2011  . Depression   . Diabetes mellitus   . Diabetes mellitus 07/04/2011  . GERD (gastroesophageal reflux disease)   . Hepatitis C   . Hypercholesterolemia   . Hypertension   . Migraines   . Pancreatitis   . 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 Respiratory failure ([place]) 07/04/2011  . Seizure ([place]) 07/02/2011  . Shortness of breath dyspnea   . Stroke [facility])    Past Surgical History:  Procedure Laterality Date  . ABDOMINAL HYSTERECTOMY    . APPENDECTOMY    . BACK SURGERY  2012   cyst removed from spine , [place]  . CHOLECYSTECTOMY    . ENDARTERECTOMY Left 05/18/2015   Procedure: ENDARTERECTOMY CAROTID;  Surgeon: Algernon Huxley, MD;  Location: [facility] ORS;  Service: Vascular;  Laterality: Left;     A IV Location/Drains/Wounds Patient Lines/Drains/Airways Status   Active Line/Drains/Airways    Name:   Placement date:   Placement time:   Site:   Days:   Peripheral IV 10/12/18 Left Antecubital   10/12/18    0821    Antecubital   less than 1          Intake/Output Last 24 hours No intake or output data in the 24 hours ending 10/12/18 1049  Labs/Imaging Results for orders placed or performed during the hospital encounter of 10/12/18 (from the past 48 hour(s))  CBC with Differential/Platelet  Status: Abnormal   Collection Time: 10/12/18  8:20 AM  Result Value Ref Range   WBC 13.1 (H) 4.0 - 10.5 K/uL   RBC 4.13 3.87 - 5.11 MIL/uL   Hemoglobin 12.6 12.0 - 15.0 g/dL   HCT 37.9 36.0 - 46.0 %   MCV 91.8 80.0 - 100.0 fL   MCH 30.5 26.0 - 34.0 pg   MCHC 33.2 30.0 - 36.0 g/dL   RDW 14.0 11.5 - 15.5 %   Platelets 393 150 - 400 K/uL   nRBC 0.0 0.0 - 0.2 %   Neutrophils Relative % 74 %   Neutro Abs 9.8 (H) 1.7 - 7.7 K/uL   Lymphocytes Relative 14 %   Lymphs Abs 1.8 0.7 - 4.0 K/uL   Monocytes Relative 8 %   Monocytes Absolute 1.1 (H) 0.1 - 1.0 K/uL   Eosinophils Relative 1 %   Eosinophils Absolute 0.1 0.0 - 0.5 K/uL    Basophils Relative 0 %   Basophils Absolute 0.1 0.0 - 0.1 K/uL   Immature Granulocytes 3 %   Abs Immature Granulocytes 0.34 (H) 0.00 - 0.07 K/uL    Comment: Performed at [facility], [address] [place], [place] [address]  Lactic acid, plasma     Status: Abnormal   Collection Time: 10/12/18  8:20 AM  Result Value Ref Range   Lactic Acid, Venous 2.3 (HH) 0.5 - 1.9 mmol/L    Comment: CRITICAL RESULT CALLED TO, READ BACK BY AND VERIFIED WITH Joesph Marcy AT 3500 10/12/2018.PMF Performed at [facility], [address] [place], [place] [address]   Comprehensive metabolic panel     Status: Abnormal   Collection Time: 10/12/18  8:20 AM  Result Value Ref Range   Sodium 136 135 - 145 mmol/L   Potassium 4.5 3.5 - 5.1 mmol/L   Chloride 102 98 - 111 mmol/L   CO2 23 22 - 32 mmol/L   Glucose, Bld 254 (H) 70 - 99 mg/dL   BUN 24 (H) 8 - 23 mg/dL   Creatinine, Ser 1.10 (H) 0.44 - 1.00 mg/dL   Calcium 8.3 (L) 8.9 - 10.3 mg/dL   Total Protein 7.1 6.5 - 8.1 g/dL   Albumin 2.9 (L) 3.5 - 5.0 g/dL   AST 25 15 - 41 U/L   ALT 22 0 - 44 U/L   Alkaline Phosphatase 59 38 - 126 U/L   Total Bilirubin 1.3 (H) 0.3 - 1.2 mg/dL   GFR calc non Af Amer 52 (L) >60 mL/min   GFR calc Af Amer >60 >60 mL/min   Anion gap 11 5 - 15    Comment: Performed at [facility], [address], [place], [place] [address]   Dg Chest 2 View  Result Date: 10/10/2018 CLINICAL DATA:  Cough EXAM: CHEST - 2 VIEW COMPARISON:  Dec 14, 2017 FINDINGS: There is no appreciable edema or consolidation. Heart size and pulmonary vascularity are normal. No adenopathy. No bone lesions. IMPRESSION: No edema or consolidation. Electronically Signed   By: Lowella Grip III M.D.   On: 10/10/2018 15:51   Dg Chest Portable 1 View  Result Date: 10/12/2018 CLINICAL DATA:  Increasing shortness of breath. Pneumonia over the past week. Smoker. EXAM: PORTABLE CHEST 1 VIEW COMPARISON:  10/10/2018 FINDINGS: Lungs are  adequately inflated without consolidation or effusion. Cardiomediastinal silhouette and remainder of the exam is unchanged. IMPRESSION: No active disease. Electronically Signed   By: Marin Olp M.D.   On: 10/12/2018 08:37   Ct  Renal Stone Study  Result Date: 10/10/2018 CLINICAL DATA:  Onset vomiting today. EXAM: CT ABDOMEN AND PELVIS WITHOUT CONTRAST TECHNIQUE: Multidetector CT imaging of the abdomen and pelvis was performed following the standard protocol without IV contrast. COMPARISON:  CT abdomen and pelvis 12/14/2017. FINDINGS: Lower chest: Micronodularity in the right lung base is new since the prior CT. The left lung base is clear. No pleural or pericardial effusion. Calcific coronary artery disease is noted. Heart size is normal. Hepatobiliary: Status post cholecystectomy. There is nodularity of the liver surface consistent with cirrhosis. No focal liver lesion. Biliary tree is unremarkable. Pancreas: Unremarkable. No pancreatic ductal dilatation or surrounding inflammatory changes. Spleen: Normal in size without focal abnormality. Adrenals/Urinary Tract: No focal renal lesion or renal or ureteral stones. Mild to moderate atrophy of the left kidney relative to the right is unchanged. Ureters and urinary bladder appear normal. The adrenal glands are normal in appearance. Stomach/Bowel: The appendix has been removed. The stomach and small and large bowel appear normal. Vascular/Lymphatic: Aortic atherosclerosis. No enlarged abdominal or pelvic lymph nodes. Reproductive: Status post hysterectomy. No adnexal masses. Other: None. Musculoskeletal: No fracture or focal lesion. Convex right scoliosis and lumbar degenerative change noted. IMPRESSION: Micronodular opacities in the right lung base are new since the prior CT scan and compatible inflammatory or infectious process, possibly No acute abnormality within the abdomen or pelvis. Negative for urinary tract stone. Cirrhotic liver. Calcific aortic and  coronary atherosclerosis. Electronically Signed   By: Inge Rise M.D.   On: 10/10/2018 15:50    Pending Labs Unresulted Labs (From admission, onward)    Start     Ordered   10/12/18 1047  Influenza panel by PCR (type A & B)  ONCE - STAT,   STAT     10/12/18 1046   10/12/18 1027  Procalcitonin - Baseline  ONCE - STAT,   STAT     10/12/18 1026   Signed and Held  Culture, blood (routine x 2) Call MD if unable to obtain prior to antibiotics being given  BLOOD CULTURE X 2,   R    Comments:  If blood cultures drawn in Emergency Department - Do not draw and cancel order    Signed and Held   Signed and Held  Culture, sputum-assessment  Once,   R     Signed and Held   Signed and Held  Gram stain  Once,   R     Signed and Held   Signed and Held  Strep pneumoniae urinary antigen  Once,   R     Signed and Held   Signed and Held  CBC  (enoxaparin (LOVENOX)    CrCl >/= 30 ml/min)  Once,   R    Comments:  Baseline for enoxaparin therapy IF NOT ALREADY DRAWN.  Notify MD if PLT < 100 K.    Signed and Held   Signed and Held  Creatinine, serum  (enoxaparin (LOVENOX)    CrCl >/= 30 ml/min)  Once,   R    Comments:  Baseline for enoxaparin therapy IF NOT ALREADY DRAWN.    Signed and Held   Signed and Held  Creatinine, serum  (enoxaparin (LOVENOX)    CrCl >/= 30 ml/min)  Weekly,   R    Comments:  while on enoxaparin therapy    Signed and Held          Vitals/Pain Today's Vitals   10/12/18 0845 10/12/18 0900 10/12/18 1015 10/12/18 1030  BP:  115/73    Pulse: 87 92 97 94  Resp: (!) 26 (!) 21 (!) 21 18  Temp:      TempSrc:      SpO2: 97% 98% 97% 92%  Weight:      PainSc:        Isolation Precautions No active isolations  Medications Medications  ondansetron (ZOFRAN) injection 4 mg (has no administration in time range)  acetaminophen (TYLENOL) tablet 650 mg (has no administration in time range)  guaiFENesin (MUCINEX) 12 hr tablet 600 mg (has no administration in time range)   guaiFENesin-dextromethorphan (ROBITUSSIN DM) 100-10 MG/5ML syrup 5 mL (has no administration in time range)  ipratropium-albuterol (DUONEB) 0.5-2.5 (3) MG/3ML nebulizer solution 3 mL (has no administration in time range)  insulin aspart (novoLOG) injection 0-9 Units (has no administration in time range)  ondansetron (ZOFRAN) injection 4 mg (4 mg Intravenous Given 10/12/18 0902)  sodium chloride 0.9 % bolus 1,000 mL (0 mLs Intravenous Stopped 10/12/18 1049)  cefTRIAXone (ROCEPHIN) 1 g in sodium chloride 0.9 % 100 mL IVPB (0 g Intravenous Stopped 10/12/18 0954)  azithromycin (ZITHROMAX) tablet 500 mg (500 mg Oral Given 10/12/18 6237)    Mobility walks Low fall risk   Focused Assessments Pulmonary Assessment Handoff:  Lung sounds:   O2 Device: Room Air        R Recommendations: See Admitting Provider Note  Report given to:   Additional Notes: n.a

## 2018-10-12 NOTE — Plan of Care (Signed)
Pt is new admit - oriented ot unit, plon of care, meds and when to call for help

## 2018-10-12 NOTE — ED Triage Notes (Signed)
Pt presents via POV c/o being "worried and scared". Dx with pneumonia 2 days ago per EMS. EMS reports not taking abx as prescribed. Pt denies worsening symptoms.

## 2018-10-12 NOTE — H&P (Signed)
[facility] [place] NAME: Tammy Armstrong    MR#:  [id number]  DATE OF BIRTH:  [birth year]  DATE OF ADMISSION:  10/12/2018  PRIMARY CARE PHYSICIAN: Gayland Curry, MD   REQUESTING/REFERRING PHYSICIAN: Rudene Re, MD  CHIEF COMPLAINT:   Chief Complaint  Patient presents with  . Pneumonia    HISTORY OF PRESENT ILLNESS: Tammy Armstrong  is a 68 y.o. female with a known history of anxiety, arthritis, peripheral vascular disease, COPD, nicotine abuse, depression, diabetes, GERD, chronic hepatitis C, hyperlipidemia, essential hypertension, migraines and history of seizures presents to the hospital with complaint of cough and fever.  Patient was seen in the emergency room few days ago with similar complaints and was diagnosed with pneumonia.  Patient did not fill her medications comes back with worsening symptoms.  She also had diarrhea and vomiting.  In the ER shows elevated lactic acid.  All elevated WBC count , patient during her previous visit had a CT scan of the abdomen which showed findings consistent with right lower lobe pneumonia.  Chest x-ray at that time was negative which is similar to this presentation.  Check chest x-ray is currently negative.  He denies any recent travel.  She denies any visitors and she lives alone.     PAST MEDICAL HISTORY:   Past Medical History:  Diagnosis Date  . Anxiety   . Arthritis   . Carotid artery occlusion   . Cigarette smoker one half pack a day or less   . COPD (chronic obstructive pulmonary disease) ([place]) 07/04/2011  . Depression   . Diabetes mellitus   . Diabetes mellitus 07/04/2011  . GERD (gastroesophageal reflux disease)   . Hepatitis C   . Hypercholesterolemia   . Hypertension   . Migraines   . Pancreatitis   . 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 Respiratory failure ([place]) 07/04/2011  . Seizure ([place]) 07/02/2011  . Shortness of breath dyspnea   . Stroke [facility])     PAST SURGICAL  HISTORY:  Past Surgical History:  Procedure Laterality Date  . ABDOMINAL HYSTERECTOMY    . APPENDECTOMY    . BACK SURGERY  2012   cyst removed from spine , [place]  . CHOLECYSTECTOMY    . ENDARTERECTOMY Left 05/18/2015   Procedure: ENDARTERECTOMY CAROTID;  Surgeon: Algernon Huxley, MD;  Location: [facility] ORS;  Service: Vascular;  Laterality: Left;    SOCIAL HISTORY:  Social History   Tobacco Use  . Smoking status: Current Every Day Smoker    Packs/day: 1.00    Years: 52.00    Pack years: 52.00    Types: Cigarettes  . Smokeless tobacco: Never Used  Substance Use Topics  . Alcohol use: No    FAMILY HISTORY:  Family History  Problem Relation Age of Onset  . Hypertension Mother   . Heart attack Mother   . Diabetes Mother   . Anxiety disorder Mother   . Stroke Mother   . Diabetes Father   . Diabetes Brother   . Alcohol abuse Other        siblings  . Hypertension Brother   . Breast cancer Neg Hx     DRUG ALLERGIES:  Allergies  Allergen Reactions  . Promethazine Hcl Other (See Comments)    Reaction:  Impaired speech   . Ciprofloxacin Other (See Comments)    Medication is contraindicated with some of pts other meds.    . Doxycycline Nausea And Vomiting  . Imitrex [Sumatriptan  Base] Other (See Comments)    Pt states that it "puts her out of this world".    . Morphine And Related Nausea And Vomiting  . Penicillins Nausea And Vomiting and Other (See Comments)    Pt is unable to answer additional questions about this medication.    . Zantac [Ranitidine Hcl] Other (See Comments)    Reaction:  Unknown   . Ketorolac Tromethamine Rash    REVIEW OF SYSTEMS:   CONSTITUTIONAL: Positive fever, positive fatigue or positive weakness.  EYES: No blurred or double vision.  EARS, NOSE, AND THROAT: No tinnitus or ear pain.  RESPIRATORY: Positive cough, positive shortness of breath, positive wheezing or no hemoptysis.  CARDIOVASCULAR: No chest pain, orthopnea, edema.   GASTROINTESTINAL: Positive nausea, positive vomiting, positive diarrhea , no abdominal pain.  GENITOURINARY: No dysuria, hematuria.  ENDOCRINE: No polyuria, nocturia,  HEMATOLOGY: No anemia, easy bruising or bleeding SKIN: No rash or lesion. MUSCULOSKELETAL: No joint pain or arthritis.   NEUROLOGIC: No tingling, numbness, weakness.  PSYCHIATRY: No anxiety or depression.   MEDICATIONS AT HOME:  Prior to Admission medications   Medication Sig Start Date End Date Taking? Authorizing Provider  acetaminophen (TYLENOL) 500 MG tablet Take 500 mg by mouth every 6 (six) hours as needed.    [provider]  albuterol (PROVENTIL HFA;VENTOLIN HFA) 108 (90 BASE) MCG/ACT inhaler Inhale 2 puffs into the lungs every 6 (six) hours as needed for wheezing or shortness of breath.     [provider]  ALPRAZolam Duanne Moron) 0.5 MG tablet Take 1 tablet 3 times Armstrong for 4 days, then 1 tablet twice Armstrong for 4 days, then 1 tablet Armstrong for 4 days, then 1 tablet every other day until gone 04/02/16   Earleen Newport, MD  ASPIRIN LOW DOSE 81 MG EC tablet TAKE ONE TABLET BY MOUTH EVERY DAY. *DO NOT CRUSH* 03/08/17   Algernon Huxley, MD  atorvastatin (LIPITOR) 40 MG tablet Take by mouth. 08/16/16 08/16/17  [provider]  azithromycin (ZITHROMAX Z-PAK) 250 MG tablet Take 2 tablets (500 mg) on  Day 1,  followed by 1 tablet (250 mg) once Armstrong on Days 2 through 5. 10/10/18 10/15/18  Harvest Dark, MD  budesonide-formoterol (SYMBICORT) 80-4.5 MCG/ACT inhaler Inhale 2 puffs into the lungs 2 (two) times Armstrong. 04/08/15   Gladstone Lighter, MD  clopidogrel (PLAVIX) 75 MG tablet TAKE ONE TABLET BY MOUTH EVERY DAY WITH BREAKFAST 11/13/16   Algernon Huxley, MD  docusate sodium (COLACE) 100 MG capsule Take 100 mg by mouth Armstrong.     [provider]  DULoxetine (CYMBALTA) 60 MG capsule Take 60 mg by mouth 2 (two) times Armstrong.    [provider]  fluticasone furoate-vilanterol (BREO ELLIPTA)  100-25 MCG/INH AEPB Inhale 1 puff into the lungs Armstrong.    [provider]  gabapentin (NEURONTIN) 300 MG capsule Take 300 mg by mouth 4 (four) times Armstrong.    [provider]  glipiZIDE (GLUCOTROL XL) 5 MG 24 hr tablet Take 1 tablet (5 mg total) by mouth Armstrong. 04/02/16   Earleen Newport, MD  hydrOXYzine (ATARAX/VISTARIL) 25 MG tablet  09/21/16   [provider]  ibuprofen (ADVIL,MOTRIN) 200 MG tablet Take 200 mg by mouth every 6 (six) hours as needed for headache or moderate pain.    [provider]  lisinopril (PRINIVIL,ZESTRIL) 20 MG tablet Take 1 tablet (20 mg total) by mouth Armstrong. 04/02/16   Earleen Newport, MD  meloxicam (MOBIC) 7.5  MG tablet Take 7.5 mg by mouth 2 (two) times Armstrong.    [provider]  metFORMIN (GLUCOPHAGE-XR) 500 MG 24 hr tablet Take 500 mg by mouth 2 (two) times Armstrong.    [provider]  metoprolol (TOPROL-XL) 50 MG 24 hr tablet Take 50 mg by mouth Armstrong.      [provider]  ondansetron (ZOFRAN ODT) 4 MG disintegrating tablet Take 1 tablet (4 mg total) by mouth every 8 (eight) hours as needed for nausea or vomiting. 10/10/18   Harvest Dark, MD  ondansetron (ZOFRAN) 4 MG tablet Take 1 tablet (4 mg total) by mouth Armstrong as needed for nausea or vomiting. 08/03/15   Earleen Newport, MD  pantoprazole (PROTONIX) 40 MG tablet Take 40 mg by mouth 2 (two) times Armstrong.    [provider]  QUEtiapine (SEROQUEL) 25 MG tablet Take 25 mg by mouth at bedtime.    [provider]  traZODone (DESYREL) 50 MG tablet Take 50 mg by mouth at bedtime.    [provider]  ziprasidone (GEODON) 40 MG capsule Take 40 mg by mouth 2 (two) times Armstrong with a meal.     [provider]      PHYSICAL EXAMINATION:   VITAL SIGNS: Blood pressure (!) 134/57, pulse (!) 107, temperature 98.4 F (36.9 C), temperature source Oral, resp. rate 14, weight 73 kg, SpO2 96 %.  GENERAL:  68  y.o.-year-old patient lying in the bed with no acute distress.  EYES: Pupils equal, round, reactive to light and accommodation. No scleral icterus. Extraocular muscles intact.  HEENT: Head atraumatic, normocephalic. Oropharynx and nasopharynx clear.  NECK:  Supple, no jugular venous distention. No thyroid enlargement, no tenderness.  LUNGS: Diminished breath sounds bilaterally without any accessory muscle usage CARDIOVASCULAR: S1, S2 normal. No murmurs, rubs, or gallops.  ABDOMEN: Soft, nontender, nondistended. Bowel sounds present. No organomegaly or mass.  EXTREMITIES: No pedal edema, cyanosis, or clubbing.  NEUROLOGIC: Cranial nerves II through XII are intact. Muscle strength 5/5 in all extremities. Sensation intact. Gait not checked.  PSYCHIATRIC: The patient is alert and oriented x 3.  SKIN: No obvious rash, lesion, or ulcer.   LABORATORY PANEL:   CBC Recent Labs  Lab 10/10/18 1301 10/12/18 0820  WBC 12.5* 13.1*  HGB 12.3 12.6  HCT 37.0 37.9  PLT 300 393  MCV 93.0 91.8  MCH 30.9 30.5  MCHC 33.2 33.2  RDW 14.6 14.0  LYMPHSABS  --  1.8  MONOABS  --  1.1*  EOSABS  --  0.1  BASOSABS  --  0.1   ------------------------------------------------------------------------------------------------------------------  Chemistries  Recent Labs  Lab 10/10/18 1301 10/12/18 0820  NA 135 136  K 3.8 4.5  CL 101 102  CO2 22 23  GLUCOSE 122* 254*  BUN 33* 24*  CREATININE 2.32* 1.10*  CALCIUM 8.0* 8.3*  AST 51* 25  ALT 30 22  ALKPHOS 51 59  BILITOT 0.9 1.3*   ------------------------------------------------------------------------------------------------------------------ estimated creatinine clearance is 47.1 mL/min (A) (by C-G formula based on SCr of 1.1 mg/dL (H)). ------------------------------------------------------------------------------------------------------------------ No results for input(s): TSH, T4TOTAL, T3FREE, THYROIDAB in the last 72 hours.  Invalid input(s):  FREET3   Coagulation profile No results for input(s): INR, PROTIME in the last 168 hours. ------------------------------------------------------------------------------------------------------------------- No results for input(s): DDIMER in the last 72 hours. -------------------------------------------------------------------------------------------------------------------  Cardiac Enzymes Recent Labs  Lab 10/10/18 1544  TROPONINI 0.04*   ------------------------------------------------------------------------------------------------------------------ Invalid input(s): POCBNP  ---------------------------------------------------------------------------------------------------------------  Urinalysis    Component Value Date/Time  COLORURINE AMBER (A) 10/10/2018 1301   APPEARANCEUR HAZY (A) 10/10/2018 1301   APPEARANCEUR Clear 07/10/2014 1749   LABSPEC 1.023 10/10/2018 1301   LABSPEC 1.025 07/10/2014 1749   PHURINE 5.0 10/10/2018 1301   GLUCOSEU NEGATIVE 10/10/2018 1301   GLUCOSEU Negative 07/10/2014 1749   HGBUR NEGATIVE 10/10/2018 1301   BILIRUBINUR SMALL (A) 10/10/2018 1301   BILIRUBINUR Negative 07/10/2014 1749   KETONESUR NEGATIVE 10/10/2018 1301   PROTEINUR NEGATIVE 10/10/2018 1301   UROBILINOGEN 1.0 07/02/2011 1005   NITRITE NEGATIVE 10/10/2018 1301   LEUKOCYTESUR MODERATE (A) 10/10/2018 1301   LEUKOCYTESUR Negative 07/10/2014 1749     RADIOLOGY: Dg Chest 2 View  Result Date: 10/10/2018 CLINICAL DATA:  Cough EXAM: CHEST - 2 VIEW COMPARISON:  Dec 14, 2017 FINDINGS: There is no appreciable edema or consolidation. Heart size and pulmonary vascularity are normal. No adenopathy. No bone lesions. IMPRESSION: No edema or consolidation. Electronically Signed   By: Lowella Grip III M.D.   On: 10/10/2018 15:51   Dg Chest Portable 1 View  Result Date: 10/12/2018 CLINICAL DATA:  Increasing shortness of breath. Pneumonia over the past week. Smoker. EXAM: PORTABLE CHEST  1 VIEW COMPARISON:  10/10/2018 FINDINGS: Lungs are adequately inflated without consolidation or effusion. Cardiomediastinal silhouette and remainder of the exam is unchanged. IMPRESSION: No active disease. Electronically Signed   By: Marin Olp M.D.   On: 10/12/2018 08:37   Ct Renal Stone Study  Result Date: 10/10/2018 CLINICAL DATA:  Onset vomiting today. EXAM: CT ABDOMEN AND PELVIS WITHOUT CONTRAST TECHNIQUE: Multidetector CT imaging of the abdomen and pelvis was performed following the standard protocol without IV contrast. COMPARISON:  CT abdomen and pelvis 12/14/2017. FINDINGS: Lower chest: Micronodularity in the right lung base is new since the prior CT. The left lung base is clear. No pleural or pericardial effusion. Calcific coronary artery disease is noted. Heart size is normal. Hepatobiliary: Status post cholecystectomy. There is nodularity of the liver surface consistent with cirrhosis. No focal liver lesion. Biliary tree is unremarkable. Pancreas: Unremarkable. No pancreatic ductal dilatation or surrounding inflammatory changes. Spleen: Normal in size without focal abnormality. Adrenals/Urinary Tract: No focal renal lesion or renal or ureteral stones. Mild to moderate atrophy of the left kidney relative to the right is unchanged. Ureters and urinary bladder appear normal. The adrenal glands are normal in appearance. Stomach/Bowel: The appendix has been removed. The stomach and small and large bowel appear normal. Vascular/Lymphatic: Aortic atherosclerosis. No enlarged abdominal or pelvic lymph nodes. Reproductive: Status post hysterectomy. No adnexal masses. Other: None. Musculoskeletal: No fracture or focal lesion. Convex right scoliosis and lumbar degenerative change noted. IMPRESSION: Micronodular opacities in the right lung base are new since the prior CT scan and compatible inflammatory or infectious process, possibly No acute abnormality within the abdomen or pelvis. Negative for urinary  tract stone. Cirrhotic liver. Calcific aortic and coronary atherosclerosis. Electronically Signed   By: Inge Rise M.D.   On: 10/10/2018 15:50    EKG: Orders placed or performed during the hospital encounter of 10/12/18  . EKG 12-Lead  . EKG 12-Lead    IMPRESSION AND PLAN: Patient 68 year old with history of COPD diabetes hepatitis C hypertension hyperlipidemia presenting with cough shortness of breath  1.  Community-acquired pneumonia we will treat with IV Rocephin and azithromycin Obtain sputum cultures follow blood culture Flu will be also ruled out  2.  Diabetes type 2 we will place her on sliding scale insulin resume her home medications once list is updated  3.  Hyperlipidemia resume home medications  4.  Essential hypertension continue home medications as taking before  5.  Depression anxiety continue her home medications  6.  Nicotine abuse smoking cessation provided patient states that since she has been feeling sick she has not smoked recently.-Encouraged to continue not to smoke nicotine patch offered patient does not want a nicotine patch.-4 minutes spent nicotine cessation provided       All the records are reviewed and case discussed with ED provider. Management plans discussed with the patient, family and they are in agreement.  CODE STATUS: Code Status History    Date Active Date Inactive Code Status Order ID Comments User Context   06/28/2016 0022 06/28/2016 2152 Full Code [id number]  Harvie Bridge, DO Inpatient   05/18/2015 1507 05/19/2015 1716 Full Code [id number]  Algernon Huxley, MD Inpatient   04/06/2015 2123 04/08/2015 1702 Full Code [id number]  Bettey Costa, MD Inpatient       TOTAL TIME TAKING CARE OF THIS PATIENT: 55 minutes.    Dustin Flock M.D on 10/12/2018 at 10:27 AM  Between 7am to 6pm - Pager - [phone]  After 6pm go to [email or web address] - password [email or web address] [facility]  [phone]  CC: Primary care physician;  Gayland Curry, MD

## 2018-10-12 NOTE — ED Provider Notes (Signed)
[facility] Emergency Department Provider Note  ____________________________________________  Time seen: Approximately 8:14 AM  I have reviewed the triage vital signs and the nursing notes.   HISTORY  Chief Complaint Pneumonia   HPI Tammy Armstrong is a 68 y.o. female with history of COPD, diabetes, hepatitis C, hypertension, hyperlipidemia, stroke, seizure who presents for evaluation of cough, vomiting and diarrhea.  Patient reports that her symptoms have been ongoing for 6 days.  Several daily episodes of nonbloody nonbilious emesis and watery diarrhea.  She also has a cough that is productive.  She denies chest pain or shortness of breath.  She does not know she has had any fever.  No abdominal pain.  She reports that she feels very weak and she is afraid to stay home by herself.  She was seen here 2 days ago and diagnosed with pneumonia.  She was sent home on antibiotics.  She did not fill the prescription or took any of the antibiotics given to her.  Past Medical History:  Diagnosis Date  . Anxiety   . Arthritis   . Carotid artery occlusion   . Cigarette smoker one half pack a day or less   . COPD (chronic obstructive pulmonary disease) ([place]) 07/04/2011  . Depression   . Diabetes mellitus   . Diabetes mellitus 07/04/2011  . GERD (gastroesophageal reflux disease)   . Hepatitis C   . Hypercholesterolemia   . Hypertension   . Migraines   . Pancreatitis   . 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 Respiratory failure ([place]) 07/04/2011  . Seizure ([place]) 07/02/2011  . Shortness of breath dyspnea   . Stroke [facility])     Patient Active Problem List   Diagnosis Date Noted  . Tobacco use disorder 07/13/2016  . 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 Right upper extremity numbness   . TIA (transient ischemic attack) 06/27/2016  . Carotid stenosis 04/08/2015  . Migraine 04/06/2015  . Dementia with behavioral disturbance ([place]) 01/19/2015  . Diabetes ([place])  07/04/2011  . Anxiety 07/04/2011  . COPD (chronic obstructive pulmonary disease) ([place]) 07/04/2011  . Purulent bronchitis ([place]) 07/04/2011  . 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 ABDOMINAL HYSTERECTOMY    . APPENDECTOMY    . BACK SURGERY  2012   cyst removed from spine , [place]  . CHOLECYSTECTOMY    . ENDARTERECTOMY Left 05/18/2015   Procedure: ENDARTERECTOMY CAROTID;  Surgeon: Algernon Huxley, MD;  Location: [facility] ORS;  Service: Vascular;  Laterality: Left;    Prior to Admission medications   Medication Sig Start Date End Date Taking? Authorizing Provider  acetaminophen (TYLENOL) 500 MG tablet Take 500 mg by mouth every 6 (six) hours as needed.    [provider]  albuterol (PROVENTIL HFA;VENTOLIN HFA) 108 (90 BASE) MCG/ACT inhaler Inhale 2 puffs into the lungs every 6 (six) hours as needed for wheezing or shortness of breath.     [provider]  ALPRAZolam Duanne Moron) 0.5 MG tablet Take 1 tablet 3 times daily for 4 days, then 1 tablet twice daily for 4 days, then 1 tablet daily for 4 days, then 1 tablet every other day until gone 04/02/16   Earleen Newport, MD  ASPIRIN LOW DOSE 81 MG EC tablet TAKE ONE TABLET BY MOUTH EVERY DAY. *DO NOT CRUSH* 03/08/17   Algernon Huxley, MD  atorvastatin (LIPITOR) 40 MG tablet Take by mouth. 08/16/16 08/16/17  [provider]  azithromycin (ZITHROMAX Z-PAK) 250 MG tablet Take 2 tablets (500 mg) on  Day 1,  followed by 1 tablet (250 mg) once daily on Days 2 through 5. 10/10/18 10/15/18  Harvest Dark, MD  budesonide-formoterol (SYMBICORT) 80-4.5 MCG/ACT inhaler Inhale 2 puffs into the lungs 2 (two) times daily. 04/08/15   Gladstone Lighter, MD  clopidogrel (PLAVIX) 75 MG tablet TAKE ONE TABLET BY MOUTH EVERY DAY WITH BREAKFAST 11/13/16   Algernon Huxley, MD  docusate sodium (COLACE) 100 MG capsule Take 100 mg by mouth daily.     [provider]  DULoxetine (CYMBALTA) 60  MG capsule Take 60 mg by mouth 2 (two) times daily.    [provider]  fluticasone furoate-vilanterol (BREO ELLIPTA) 100-25 MCG/INH AEPB Inhale 1 puff into the lungs daily.    [provider]  gabapentin (NEURONTIN) 300 MG capsule Take 300 mg by mouth 4 (four) times daily.    [provider]  glipiZIDE (GLUCOTROL XL) 5 MG 24 hr tablet Take 1 tablet (5 mg total) by mouth daily. 04/02/16   Earleen Newport, MD  hydrOXYzine (ATARAX/VISTARIL) 25 MG tablet  09/21/16   [provider]  ibuprofen (ADVIL,MOTRIN) 200 MG tablet Take 200 mg by mouth every 6 (six) hours as needed for headache or moderate pain.    [provider]  lisinopril (PRINIVIL,ZESTRIL) 20 MG tablet Take 1 tablet (20 mg total) by mouth daily. 04/02/16   Earleen Newport, MD  meloxicam (MOBIC) 7.5 MG tablet Take 7.5 mg by mouth 2 (two) times daily.    [provider]  metFORMIN (GLUCOPHAGE-XR) 500 MG 24 hr tablet Take 500 mg by mouth 2 (two) times daily.    [provider]  metoprolol (TOPROL-XL) 50 MG 24 hr tablet Take 50 mg by mouth daily.      [provider]  ondansetron (ZOFRAN ODT) 4 MG disintegrating tablet Take 1 tablet (4 mg total) by mouth every 8 (eight) hours as needed for nausea or vomiting. 10/10/18   Harvest Dark, MD  ondansetron (ZOFRAN) 4 MG tablet Take 1 tablet (4 mg total) by mouth daily as needed for nausea or vomiting. 08/03/15   Earleen Newport, MD  pantoprazole (PROTONIX) 40 MG tablet Take 40 mg by mouth 2 (two) times daily.    [provider]  QUEtiapine (SEROQUEL) 25 MG tablet Take 25 mg by mouth at bedtime.    [provider]  traZODone (DESYREL) 50 MG tablet Take 50 mg by mouth at bedtime.    [provider]  ziprasidone (GEODON) 40 MG capsule Take 40 mg by mouth 2 (two) times daily with a meal.     [provider]    Allergies Promethazine hcl; Ciprofloxacin; Doxycycline; Imitrex  [sumatriptan base]; Morphine and related; Penicillins; Zantac [ranitidine hcl]; and Ketorolac tromethamine  Family History  Problem Relation Age of Onset  . Hypertension Mother   . Heart attack Mother   . Diabetes Mother   . Anxiety disorder Mother   . Stroke Mother   . Diabetes Father   . Diabetes Brother   . Alcohol abuse Other        siblings  . Hypertension Brother   . Breast cancer Neg Hx     Social History Social History   Tobacco Use  . Smoking status: Current Every Day Smoker    Packs/day: 1.00    Years: 52.00    Pack years: 52.00    Types: Cigarettes  . Smokeless tobacco:  Never Used  Substance Use Topics  . Alcohol use: No  . Drug use: No    Review of Systems  Constitutional: Negative for fever. Eyes: Negative for visual changes. ENT: Negative for sore throat. Neck: No neck pain  Cardiovascular: Negative for chest pain. Respiratory: Negative for shortness of breath. +cough Gastrointestinal: Negative for abdominal pain. + vomiting and diarrhea. Genitourinary: Negative for dysuria. Musculoskeletal: Negative for back pain. Skin: Negative for rash. Neurological: Negative for headaches, weakness or numbness. Psych: No SI or HI  ____________________________________________   PHYSICAL EXAM:  VITAL SIGNS: ED Triage Vitals  Enc Vitals Group     BP 10/12/18 0808 (!) 134/57     Pulse Rate 10/12/18 0808 (!) 107     Resp 10/12/18 0808 14     Temp 10/12/18 0808 98.4 F (36.9 C)     Temp Source 10/12/18 0808 Oral     SpO2 10/12/18 0808 96 %     Weight 10/12/18 0809 161 lb (73 kg)     Height --      Head Circumference --      Peak Flow --      Pain Score 10/12/18 0809 8     Pain Loc --      Pain Edu? --      Excl. in [place]? --     Constitutional: Alert and oriented. Well appearing and in no apparent distress. HEENT:      Head: Normocephalic and atraumatic.         Eyes: Conjunctivae are normal. Sclera is non-icteric.       Mouth/Throat: Mucous  membranes are moist.       Neck: Supple with no signs of meningismus. Cardiovascular: Tachycardic with regular rhythm. No murmurs, gallops, or rubs. 2+ symmetrical distal pulses are present in all extremities. No JVD. Respiratory: Normal respiratory effort, normal sats.  Gastrointestinal: Soft, non tender, and non distended with positive bowel sounds. No rebound or guarding. Musculoskeletal: Nontender with normal range of motion in all extremities. No edema, cyanosis, or erythema of extremities. Neurologic: Normal speech and language. Face is symmetric. Moving all extremities. No gross focal neurologic deficits are appreciated. Skin: Skin is warm, dry and intact. No rash noted. Psychiatric: Mood and affect are normal. Speech and behavior are normal.  ____________________________________________   LABS (all labs ordered are listed, but only abnormal results are displayed)  Labs Reviewed  CBC WITH DIFFERENTIAL/PLATELET - Abnormal; Notable for the following components:      Result Value   WBC 13.1 (*)    Neutro Abs 9.8 (*)    Monocytes Absolute 1.1 (*)    Abs Immature Granulocytes 0.34 (*)    All other components within normal limits  LACTIC ACID, PLASMA - Abnormal; Notable for the following components:   Lactic Acid, Venous 2.3 (*)    All other components within normal limits  COMPREHENSIVE METABOLIC PANEL - Abnormal; Notable for the following components:   Glucose, Bld 254 (*)    BUN 24 (*)    Creatinine, Ser 1.10 (*)    Calcium 8.3 (*)    Albumin 2.9 (*)    Total Bilirubin 1.3 (*)    GFR calc non Af Amer 52 (*)    All other components within normal limits   ____________________________________________  EKG  ED ECG REPORT I, Rudene Re, the attending physician, personally viewed and interpreted this ECG.  Sinus tachycardia, rate of 102, normal intervals, normal axis, no ST elevations or depressions.  Otherwise normal EKG. ____________________________________________  RADIOLOGY  I have personally reviewed the images performed during this visit and I agree with the Radiologist's read.   Interpretation by Radiologist:  Dg Chest Portable 1 View  Result Date: 10/12/2018 CLINICAL DATA:  Increasing shortness of breath. Pneumonia over the past week. Smoker. EXAM: PORTABLE CHEST 1 VIEW COMPARISON:  10/10/2018 FINDINGS: Lungs are adequately inflated without consolidation or effusion. Cardiomediastinal silhouette and remainder of the exam is unchanged. IMPRESSION: No active disease. Electronically Signed   By: Marin Olp M.D.   On: 10/12/2018 08:37      ____________________________________________   PROCEDURES  Procedure(s) performed: None Procedures Critical Care performed:  None ____________________________________________   INITIAL IMPRESSION / ASSESSMENT AND PLAN / ED COURSE  68 y.o. female with history of COPD, diabetes, hepatitis C, hypertension, hyperlipidemia, stroke, seizure who presents for evaluation of cough, vomiting and diarrhea x 6 days. Patient seen 2 days ago diagnosed with pneumonia.  At that time patient had AKI.  There was recommendation to get patient admitted however she refused and went home.  She has not been taking her antibiotics at home.  Ddx gastroenteritis, viral illness, Flu, pneumonia.  Patient is well-appearing and in no distress with normal work of breathing, normal sats.   Will get labs, CXR.     _________________________ 10:24 AM on 10/12/2018 -----------------------------------------  Patient unable to care for herself at home. Has not filled abx prescription, has no family, lives alone. WBC worsening, borderline lactic acidosis. CXR negative but CXR was normal 2 days ago but PNA seen on CT. No respiratory distress. No recent admission. Treated with rocephin and azithromycin. Discussed with Dr. Posey Pronto for admission.   As part of my medical decision making, I reviewed the following data within the [place] notes reviewed and incorporated, Labs reviewed , EKG interpreted , Old chart reviewed, Radiograph reviewed , Discussed with admitting physician  Notes from prior ED visits and  Controlled Substance Database    Pertinent labs & imaging results that were available during my care of the patient were reviewed by me and considered in my medical decision making (see chart for details).    ____________________________________________   FINAL CLINICAL IMPRESSION(S) / ED DIAGNOSES  Final diagnoses:  Community acquired pneumonia, unspecified laterality      NEW MEDICATIONS STARTED DURING THIS VISIT:  ED Discharge Orders    None       Note:  This document was prepared using Dragon voice recognition software and may include unintentional dictation errors.    Alfred Levins, [place], MD 10/12/18 1026

## 2018-10-12 NOTE — ED Notes (Signed)
Report called - pt ready to go up

## 2018-10-12 NOTE — Progress Notes (Signed)
Advanced care plan.  Purpose of the Encounter: CODE STATUS  Parties in Attendance: Patient herself  Patient's Decision Capacity: Intact  Subjective/Patient's story: Tammy Armstrong  is a 68 y.o. female with a known history of anxiety, arthritis, peripheral vascular disease, COPD, nicotine abuse, depression, diabetes, GERD, chronic hepatitis C, hyperlipidemia, essential hypertension, migraines and history of seizures presents to the hospital with complaint of cough and fever.  Patient was seen in the emergency room few days ago with similar complaints and was diagnosed with pneumonia.  Patient did not fill her medications comes back with worsening symptoms.  She also had diarrhea and vomiting.  In the ER shows elevated lactic acid.  All elevated WBC count , patient during her previous visit had a CT scan of the abdomen which showed findings consistent with right lower lobe pneumonia.  Chest x-ray at that time was negative which is similar to this presentation.  Check chest x-ray is currently negative.    Objective/Medical story I discussed with the patient regarding her desires for cardiac and pulmonary resuscitation.  Explained to her what that entails.  I also discussed with the patient regarding healthcare power of attorney and living will.  I explained her what these entail.   Goals of care determination:  Patient states that she wants to be a full code.  And wishes to have cardiac and pulmonary resuscitation. She also states that she will discuss with her family members regarding healthcare power of attorney and living will for the future   CODE STATUS:  Full code  Time spent discussing advanced care planning: 16 minutes

## 2018-10-13 LAB — CBC WITH DIFFERENTIAL/PLATELET
Abs Immature Granulocytes: 0.29 10*3/uL — ABNORMAL HIGH (ref 0.00–0.07)
Basophils Absolute: 0.1 10*3/uL (ref 0.0–0.1)
Basophils Relative: 1 %
Eosinophils Absolute: 0.2 10*3/uL (ref 0.0–0.5)
Eosinophils Relative: 2 %
HCT: 35.4 % — ABNORMAL LOW (ref 36.0–46.0)
Hemoglobin: 11.1 g/dL — ABNORMAL LOW (ref 12.0–15.0)
Immature Granulocytes: 3 %
Lymphocytes Relative: 21 %
Lymphs Abs: 2.4 10*3/uL (ref 0.7–4.0)
MCH: 30.7 pg (ref 26.0–34.0)
MCHC: 31.4 g/dL (ref 30.0–36.0)
MCV: 97.8 fL (ref 80.0–100.0)
Monocytes Absolute: 0.8 10*3/uL (ref 0.1–1.0)
Monocytes Relative: 7 %
NEUTROS PCT: 66 %
Neutro Abs: 7.8 10*3/uL — ABNORMAL HIGH (ref 1.7–7.7)
PLATELETS: 350 10*3/uL (ref 150–400)
RBC: 3.62 MIL/uL — ABNORMAL LOW (ref 3.87–5.11)
RDW: 14.6 % (ref 11.5–15.5)
WBC: 11.6 10*3/uL — ABNORMAL HIGH (ref 4.0–10.5)
nRBC: 0 % (ref 0.0–0.2)

## 2018-10-13 LAB — GLUCOSE, CAPILLARY
GLUCOSE-CAPILLARY: 70 mg/dL (ref 70–99)
Glucose-Capillary: 65 mg/dL — ABNORMAL LOW (ref 70–99)
Glucose-Capillary: 124 mg/dL — ABNORMAL HIGH (ref 70–99)
Glucose-Capillary: 125 mg/dL — ABNORMAL HIGH (ref 70–99)
Glucose-Capillary: 80 mg/dL (ref 70–99)

## 2018-10-13 LAB — PROCALCITONIN: Procalcitonin: <0.1 ng/mL

## 2018-10-13 LAB — STREP PNEUMONIAE URINARY ANTIGEN: STREP PNEUMO URINARY ANTIGEN: POSITIVE — AB

## 2018-10-13 MED ORDER — GABAPENTIN 300 MG PO CAPS
300.0000 mg | ORAL_CAPSULE | Freq: Three times a day (TID) | ORAL | Status: DC
  Administered 2018-10-13 – 2018-10-14 (×3): 300 mg via ORAL
  Filled 2018-10-13 (×3): qty 1

## 2018-10-13 MED ORDER — ACETAMINOPHEN 325 MG PO TABS: 650.0000 mg | ORAL_TABLET | Freq: Four times a day (QID) | ORAL | Status: DC | PRN

## 2018-10-13 MED ORDER — FLUTICASONE FUROATE-VILANTEROL 100-25 MCG/INH IN AEPB
1.0000 | INHALATION_SPRAY | Freq: Every day | RESPIRATORY_TRACT | Status: DC
  Administered 2018-10-13 – 2018-10-14 (×2): 1 via RESPIRATORY_TRACT
  Filled 2018-10-13: qty 28

## 2018-10-13 MED ORDER — SODIUM CHLORIDE 0.9 % IV SOLN: INTRAVENOUS | Status: DC | PRN

## 2018-10-13 NOTE — Progress Notes (Signed)
Inpatient Diabetes Program Recommendations  AACE/ADA: New Consensus Statement on Inpatient Glycemic Control (2015)  Target Ranges:  Prepandial:   less than 140 mg/dL      Peak postprandial:   less than 180 mg/dL (1-2 hours)      Critically ill patients:  140 - 180 mg/dL   Results for MERRIANNE, MCCUMBERS (MRN [id number]) as of 10/13/2018 10:34  Ref. Range 10/12/2018 12:54 10/12/2018 13:21 10/12/2018 16:35 10/12/2018 17:28  Glucose-Capillary Latest Ref Range: 70 - 99 mg/dL 64 (L) 78 67 (L) 99   Results for ZAMARIA, BRAZZLE (MRN [id number]) as of 10/13/2018 10:34  Ref. Range 10/13/2018 07:44 10/13/2018 08:32  Glucose-Capillary Latest Ref Range: 70 - 99 mg/dL 65 (L) 124 (H)     Home DM Meds: Metformin 500 mg BID        Glipizide 5 mg Daily   Current Orders: Novolog Sensitive Correction Scale/ SSI (0-9 units) TID AC     Metformin 500 mg BID      Glipizide 5 mg Daily     MD- Note patient with Hypoglycemia yesterday at 1pm and again at 4:30pm yesterday.  Hypoglycemic again this AM.  Please consider discontinuing Glipizide for now     --Will follow patient during hospitalization--  Wyn Quaker RN, MSN, CDE Diabetes Coordinator Inpatient Glycemic Control Team Team Pager: [phone] (8a-5p)

## 2018-10-13 NOTE — Evaluation (Signed)
Physical Therapy Evaluation Patient Details Name: Tammy Armstrong MRN: [id number] DOB: 06-Nov-1950 Today's Date: 10/13/2018   History of Present Illness  Pt is a 68 y.o. female presenting to hospital 10/10/18 with N/V/diarrhea, weakness, cough, fever, and fall and discharged home with diagnosis of PNA; pt presenting back to hospital 10/12/18 with similar symptoms and admitted with community acquired PNA, DM, and gastroenteritis.  PMH includes anxiety, COPD, depression, DM, Hep C, htn, pontine hemorrhage, seizure, stroke, dementia with behavioral disturbance, and L endarterectomy.  Clinical Impression  Prior to hospital admission, pt was modified independent with functional mobility using "3 prong" cane.  Pt lives alone in 1st floor apt (level entry).  Currently pt is modified independent with bed mobility and transfers and CGA with ambulation 200 feet with youth sized RW (pt holding onto furniture in room without AD (for balance) but appears (and pt reports being) steadier/safer with RW use.  Pt would benefit from skilled PT to address noted impairments and functional limitations (see below for any additional details).  Upon hospital discharge, recommend pt discharge to home with HHPT.    Follow Up Recommendations Home health PT    Equipment Recommendations  Rolling walker with 5" wheels(youth sized)    Recommendations for Other Services       Precautions / Restrictions Precautions Precautions: Fall Restrictions Weight Bearing Restrictions: No      Mobility  Bed Mobility Overal bed mobility: Modified Independent             General bed mobility comments: Semi-supine to sit without any noted difficulties.  Transfers Overall transfer level: Modified independent Equipment used: Rolling walker (2 wheeled)             General transfer comment: fairly strong stand noted with UE support  Ambulation/Gait Ambulation/Gait assistance: Min guard Gait Distance (Feet): 200  Feet Assistive device: Rolling walker (2 wheeled)(youth sized)   Gait velocity: decreased   General Gait Details: decreased B LE step length  Stairs            Wheelchair Mobility    Modified Rankin (Stroke Patients Only)       Balance Overall balance assessment: Needs assistance Sitting-balance support: No upper extremity supported;Feet supported Sitting balance-Leahy Scale: Good Sitting balance - Comments: steady sitting reaching within BOS   Standing balance support: No upper extremity supported Standing balance-Leahy Scale: Good Standing balance comment: steady standing reaching within BOS                             Pertinent Vitals/Pain Pain Assessment: No/denies pain  Vitals (HR and O2 on room air) stable and WFL throughout treatment session.    Home Living Family/patient expects to be discharged to:: Private residence Living Arrangements: Alone   Type of Home: Apartment Home Access: Level entry     Home Layout: One level(1st floor apt) Home Equipment: Grab bars - tub/shower      Prior Function Level of Independence: Independent         Comments: Pt reports that she uses a 3 prong cane but has difficulty using it.  Recent fall only fall in past 6 months.     Hand Dominance        Extremity/Trunk Assessment   Upper Extremity Assessment Upper Extremity Assessment: Generalized weakness    Lower Extremity Assessment Lower Extremity Assessment: Generalized weakness    Cervical / Trunk Assessment Cervical / Trunk Assessment: Normal  Communication   Communication:  HOH  Cognition Arousal/Alertness: Awake/alert Behavior During Therapy: (Appeared a little impulsive at times) Overall Cognitive Status: Within Functional Limits for tasks assessed                                        General Comments   Nursing cleared pt for participation in physical therapy.  Pt agreeable to PT session.    Exercises      Assessment/Plan    PT Assessment Patient needs continued PT services  PT Problem List Decreased strength;Decreased activity tolerance;Decreased balance;Decreased mobility;Decreased knowledge of use of DME       PT Treatment Interventions DME instruction;Gait training;Functional mobility training;Therapeutic activities;Therapeutic exercise;Balance training;Patient/family education    PT Goals (Current goals can be found in the Care Plan section)  Acute Rehab PT Goals Patient Stated Goal: to improve strength and balance PT Goal Formulation: With patient Time For Goal Achievement: 10/27/18 Potential to Achieve Goals: Good    Frequency Min 2X/week   Barriers to discharge        Co-evaluation               AM-PAC PT "6 Clicks" Mobility  Outcome Measure Help needed turning from your back to your side while in a flat bed without using bedrails?: None Help needed moving from lying on your back to sitting on the side of a flat bed without using bedrails?: None Help needed moving to and from a bed to a chair (including a wheelchair)?: None Help needed standing up from a chair using your arms (e.g., wheelchair or bedside chair)?: None Help needed to walk in hospital room?: A Little Help needed climbing 3-5 steps with a railing? : A Little 6 Click Score: 22    End of Session Equipment Utilized During Treatment: Gait belt Activity Tolerance: Patient limited by fatigue Patient left: in chair;with call bell/phone within reach;with chair alarm set Nurse Communication: Mobility status;Precautions PT Visit Diagnosis: Other abnormalities of gait and mobility (R26.89);Muscle weakness (generalized) (M62.81);History of falling (Z91.81)    Time: 2956-2130 PT Time Calculation (min) (ACUTE ONLY): 27 min   Charges:   PT Evaluation $PT Eval Low Complexity: 1 Low PT Treatments $Therapeutic Exercise: 8-22 mins       Leitha Bleak, PT 10/13/18, 4:26 PM [phone]

## 2018-10-13 NOTE — Progress Notes (Signed)
[place] [facility] [place] NAME: Brendolyn Stockley    MR#:  [id number]  DATE OF BIRTH:  Nov 29, 1950  SUBJECTIVE:  CHIEF COMPLAINT:   Chief Complaint  Patient presents with  . Pneumonia   Continues to feel weak.  Cough.  No sputum.  Diarrhea and vomiting resolved.  Some nausea.  Afebrile.  REVIEW OF SYSTEMS:    Review of Systems  Constitutional: Positive for malaise/fatigue. Negative for chills, fever and weight loss.  HENT: Negative for hearing loss and nosebleeds.   Eyes: Negative for blurred vision, double vision and pain.  Respiratory: Positive for cough. Negative for hemoptysis, sputum production, shortness of breath and wheezing.   Cardiovascular: Negative for chest pain, palpitations, orthopnea and leg swelling.  Gastrointestinal: Negative for abdominal pain, constipation, diarrhea, nausea and vomiting.  Genitourinary: Negative for dysuria and hematuria.  Musculoskeletal: Negative for back pain, falls and myalgias.  Skin: Negative for rash.  Neurological: Negative for dizziness, tremors, sensory change, speech change, focal weakness, seizures and headaches.  Endo/Heme/Allergies: Does not bruise/bleed easily.  Psychiatric/Behavioral: Negative for depression and memory loss. The patient is not nervous/anxious.     DRUG ALLERGIES:   Allergies  Allergen Reactions  . Promethazine Hcl Other (See Comments)    Reaction:  Impaired speech   . Ciprofloxacin Other (See Comments)    Medication is contraindicated with some of pts other meds.    . Doxycycline Nausea And Vomiting  . Imitrex [Sumatriptan Base] Other (See Comments)    Pt states that it "puts her out of this world".    . Morphine And Related Nausea And Vomiting  . Penicillins Nausea And Vomiting and Other (See Comments)    Pt is unable to answer additional questions about this medication.    . Zantac [Ranitidine Hcl] Other (See Comments)    Reaction:  Unknown   . Ketorolac Tromethamine  Rash    VITALS:  Blood pressure (!) 119/50, pulse (!) 57, temperature 97.8 F (36.6 C), resp. rate 16, height 5\' 2"  (1.575 m), weight 68.5 kg, SpO2 98 %.  PHYSICAL EXAMINATION:   Physical Exam  GENERAL:  68 y.o.-year-old patient lying in the bed with no acute distress.  EYES: Pupils equal, round, reactive to light and accommodation. No scleral icterus. Extraocular muscles intact.  HEENT: Head atraumatic, normocephalic. Oropharynx and nasopharynx clear.  NECK:  Supple, no jugular venous distention. No thyroid enlargement, no tenderness.  LUNGS: Normal breath sounds bilaterally, no wheezing, rales, rhonchi. No use of accessory muscles of respiration.  CARDIOVASCULAR: S1, S2 normal. No murmurs, rubs, or gallops.  ABDOMEN: Soft, nontender, nondistended. Bowel sounds present. No organomegaly or mass.  EXTREMITIES: No cyanosis, clubbing or edema b/l.    NEUROLOGIC: Cranial nerves II through XII are intact. No focal Motor or sensory deficits b/l.   PSYCHIATRIC: The patient is alert and oriented x 3.  SKIN: No obvious rash, lesion, or ulcer.   LABORATORY PANEL:   CBC Recent Labs  Lab 10/12/18 0820  WBC 13.1*  HGB 12.6  HCT 37.9  PLT 393   ------------------------------------------------------------------------------------------------------------------ Chemistries  Recent Labs  Lab 10/12/18 0820  NA 136  K 4.5  CL 102  CO2 23  GLUCOSE 254*  BUN 24*  CREATININE 1.10*  CALCIUM 8.3*  AST 25  ALT 22  ALKPHOS 59  BILITOT 1.3*   ------------------------------------------------------------------------------------------------------------------  Cardiac Enzymes Recent Labs  Lab 10/10/18 1544  TROPONINI 0.04*   ------------------------------------------------------------------------------------------------------------------  RADIOLOGY:  Dg Chest Portable 1 View  Result Date: 10/12/2018 CLINICAL DATA:  Increasing shortness of breath. Pneumonia over the past week. Smoker.  EXAM: PORTABLE CHEST 1 VIEW COMPARISON:  10/10/2018 FINDINGS: Lungs are adequately inflated without consolidation or effusion. Cardiomediastinal silhouette and remainder of the exam is unchanged. IMPRESSION: No active disease. Electronically Signed   By: Marin Olp M.D.   On: 10/12/2018 08:37     ASSESSMENT AND PLAN:   Patient 68 year old with history of COPD diabetes hepatitis C hypertension hyperlipidemia presenting with cough shortness of breath  *Gastroenteritis.  Improving.  Diarrhea resolved.  No vomiting.  *  Community-acquired pneumonia  IV Rocephin and azithromycin Obtain sputum cultures follow blood culture Flu Negative. It is unclear if this is pneumonia or just plain bronchitis.  Check procalcitonin and repeat WBC count.   * Diabetes type 2 Sliding scale insulin.  Hold oral hypoglycemic medications. Due to poor oral intake  * Hyperlipidemia Resume home medications  * Essential hypertension continue home medications as taking before  * Depression anxiety continue her home medications  * Tobacco use counseled to quit on admission  All the records are reviewed and case discussed with Care Management/Social Worker Management plans discussed with the patient, family and they are in agreement.  CODE STATUS: FULL CODE  DVT Prophylaxis: SCDs  TOTAL TIME TAKING CARE OF THIS PATIENT: 35 minutes.   POSSIBLE D/C IN 1-2 DAYS, DEPENDING ON CLINICAL CONDITION.  Leia Alf Brisa Auth M.D on 10/13/2018 at 11:11 AM  Between 7am to 6pm - Pager - [phone]  After 6pm go to [email or web address] - password [email or web address] [facility]  Office  [phone]  CC: Primary care physician; Gayland Curry, MD  Note: This dictation was prepared with Dragon dictation along with smaller phrase technology. Any transcriptional errors that result from this process are unintentional.

## 2018-10-13 NOTE — TOC Initial Note (Signed)
Transition of Care [facility]) - Initial/Assessment Note    Patient Details  Name: Tammy Armstrong MRN: [id number] Date of Birth: 1950/12/31  [facility]) CM/SW Contact:    Shelbie Hutching, RN Phone Number: 10/13/2018, 10:14 AM  Clinical Narrative:                 Patient is from home and lives alone in her apartment.  Patient reports that she has no family and no friends, she likes to be alone.  Patient is established with [facility].  RNCM contacted [facility] and they report that they are working telephonically and the patient's case worker will be reaching out weekly during this time- Case worker is [employer]. Lyndee Leo number [phone], she will check to see if someone from [facility] can pick the patient up at discharge.  Patient reports that Nicole Kindred with [facility] picked her up from the ER last visit and takes her grocery shopping.  Resources for transportation given to patient, [employer] and [employer] because she reports she has no way to get anywhere.  PCP is Gayland Curry in [place] and pharmacy is [facility] in [place].    Expected Discharge Plan: Home/Self Care     Patient Goals and CMS Choice        Expected Discharge Plan and Services Expected Discharge Plan: Home/Self Care   Discharge Planning Services: CM Consult   Living arrangements for the past 2 months: Apartment Expected Discharge Date: 10/13/18                        Prior Living Arrangements/Services Living arrangements for the past 2 months: Apartment Lives with:: Self Patient language and need for interpreter reviewed:: No Do you feel safe going back to the place where you live?: Yes      Need for Family Participation in Patient Care: No (Comment)(no family ) Care giver support system in place?: ([facility]) Current home services: Other (comment)([facility]) Criminal Activity/Legal Involvement Pertinent to Current Situation/Hospitalization: No - Comment as needed  Activities of Daily  Living Home Assistive Devices/Equipment: None ADL Screening (condition at time of admission) Patient's cognitive ability adequate to safely complete daily activities?: Yes Is the patient deaf or have difficulty hearing?: No Does the patient have difficulty seeing, even when wearing glasses/contacts?: No Does the patient have difficulty concentrating, remembering, or making decisions?: No Patient able to express need for assistance with ADLs?: Yes Does the patient have difficulty dressing or bathing?: No Independently performs ADLs?: Yes (appropriate for developmental age) Does the patient have difficulty walking or climbing stairs?: Yes Weakness of Legs: None Weakness of Arms/Hands: None  Permission Sought/Granted Permission sought to share information with : Other (comment) Permission granted to share information with : Yes, Verbal Permission Granted     Permission granted to share info w AGENCY: [place]        Emotional Assessment Appearance:: Appears older than stated age Attitude/Demeanor/Rapport: Engaged Affect (typically observed): Accepting Orientation: : Oriented to Self, Oriented to Place, Oriented to  Time, Oriented to Situation Alcohol / Substance Use: Not Applicable Psych Involvement: Outpatient Provider  Admission diagnosis:  Community acquired pneumonia, unspecified laterality [J18.9] Patient Active Problem List   Diagnosis Date Noted  . PNA (pneumonia) 10/12/2018  . Tobacco use disorder 07/13/2016  . 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 Right upper extremity numbness   . TIA (transient ischemic attack) 06/27/2016  . Carotid stenosis 04/08/2015  . Migraine 04/06/2015  . Dementia with  behavioral disturbance ([place]) 01/19/2015  . Diabetes ([place]) 07/04/2011  . Anxiety 07/04/2011  . COPD (chronic obstructive pulmonary disease) ([place]) 07/04/2011  . Purulent bronchitis ([place]) 07/04/2011  . 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 [place] [address] Phone: [phone] Fax: [phone]     Social Determinants of Health (SDOH) Interventions    Readmission Risk Interventions No flowsheet data found.

## 2018-10-14 LAB — BASIC METABOLIC PANEL
Anion gap: 7 (ref 5–15)
BUN: 20 mg/dL (ref 8–23)
CO2: 21 mmol/L — ABNORMAL LOW (ref 22–32)
Calcium: 7.4 mg/dL — ABNORMAL LOW (ref 8.9–10.3)
Chloride: 113 mmol/L — ABNORMAL HIGH (ref 98–111)
Creatinine, Ser: 1.01 mg/dL — ABNORMAL HIGH (ref 0.44–1.00)
GFR calc Af Amer: >60 mL/min (ref >60)
GFR calc non Af Amer: 58 mL/min — ABNORMAL LOW (ref >60)
GLUCOSE: 124 mg/dL — AB (ref 70–99)
Potassium: 4.1 mmol/L (ref 3.5–5.1)
Sodium: 141 mmol/L (ref 135–145)

## 2018-10-14 LAB — GLUCOSE, CAPILLARY: Glucose-Capillary: 103 mg/dL — ABNORMAL HIGH (ref 70–99)

## 2018-10-14 MED ORDER — PREDNISONE 20 MG PO TABS: 40.0000 mg | ORAL_TABLET | Freq: Every day | ORAL | 0 refills | Status: AC

## 2018-10-14 MED ORDER — PREDNISONE 50 MG PO TABS
50.0000 mg | ORAL_TABLET | Freq: Every day | ORAL | Status: DC
  Administered 2018-10-14: 50 mg via ORAL
  Filled 2018-10-14: qty 1

## 2018-10-14 MED ORDER — AZITHROMYCIN 250 MG PO TABS: 250.0000 mg | ORAL_TABLET | Freq: Every day | ORAL | 0 refills | Status: DC

## 2018-10-14 NOTE — TOC Transition Note (Signed)
Transition of Care [facility]) - CM/SW Discharge Note   Patient Details  Name: ANGELEAH LABRAKE MRN: [id number] Date of Birth: Jul 22, 1951  Transition [facility]) CM/SW Contact:  Katrina Stack, RN Phone Number: 10/14/2018, 12:04 PM   Clinical Narrative:   Patient was in the hall telling the nurses she was going to report them.  She was led back into her room by staff.  During progression, it was reported that patient had contacted her caseworker with [facility] and was transported home.  There were orders for home health RN PT. It is reported she has had [facility] in the past.  Referral called to and accepted. CM called all of listed contacts and none are current.  Left a hippa compliant voicemail on the mobile phone and home phone    Final next level of care: [facility] Barriers to Discharge: Other (comment)(Patient "walked out" before CM could speak with her)   Patient Goals and CMS Choice   CMS Medicare.gov Compare Post Acute Care list provided to:: Other (Comment Required)(Patient left the unit before CM could provide) Choice offered to / list presented to : NA  Discharge Placement                       Discharge Plan and Services   Discharge Planning Services: CM Consult                      Social Determinants of Health (SDOH) Interventions     Readmission Risk Interventions No flowsheet data found.

## 2018-10-14 NOTE — Discharge Instructions (Signed)
Resume diet and activity as before ° ° °

## 2018-10-14 NOTE — Progress Notes (Signed)
Pt d/c to home via friend. IV removed intact. Education completed. All questions answered. Belongings sent with pt

## 2018-10-16 ENCOUNTER — Ambulatory Visit (INDEPENDENT_AMBULATORY_CARE_PROVIDER_SITE_OTHER): Payer: Medicare Other | Admitting: Nurse Practitioner

## 2018-10-16 ENCOUNTER — Encounter (INDEPENDENT_AMBULATORY_CARE_PROVIDER_SITE_OTHER): Payer: Medicare Other

## 2018-10-17 LAB — CULTURE, BLOOD (ROUTINE X 2)
Culture: NO GROWTH
Culture: NO GROWTH
Special Requests: ADEQUATE
Special Requests: ADEQUATE

## 2018-10-17 NOTE — Discharge Summary (Signed)
[name] NAME: Tammy Armstrong    MR#:  [id number]  DATE OF BIRTH:  10-06-1950  DATE OF ADMISSION:  10/12/2018 ADMITTING PHYSICIAN: Dustin Flock, MD  DATE OF DISCHARGE: 10/14/2018 11:42 AM  PRIMARY CARE PHYSICIAN: Gayland Curry, MD   ADMISSION DIAGNOSIS:  Community acquired pneumonia, unspecified laterality [J18.9]  DISCHARGE DIAGNOSIS:  Active Problems:   PNA (pneumonia)   SECONDARY DIAGNOSIS:   Past Medical History:  Diagnosis Date  . Anxiety   . Arthritis   . Carotid artery occlusion   . Cigarette smoker one half pack a day or less   . COPD (chronic obstructive pulmonary disease) ([place]) 07/04/2011  . Depression   . Diabetes mellitus   . Diabetes mellitus 07/04/2011  . GERD (gastroesophageal reflux disease)   . Hepatitis C   . Hypercholesterolemia   . Hypertension   . Migraines   . Pancreatitis   . 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 Respiratory failure ([place]) 07/04/2011  . Seizure ([place]) 07/02/2011  . Shortness of breath dyspnea   . Stroke [facility])      ADMITTING HISTORY  HISTORY OF PRESENT ILLNESS: Tammy Armstrong  is a 68 y.o. female with a known history of anxiety, arthritis, peripheral vascular disease, COPD, nicotine abuse, depression, diabetes, GERD, chronic hepatitis C, hyperlipidemia, essential hypertension, migraines and history of seizures presents to the hospital with complaint of cough and fever.  Patient was seen in the emergency room few days ago with similar complaints and was diagnosed with pneumonia.  Patient did not fill her medications comes back with worsening symptoms.  She also had diarrhea and vomiting.  In the ER shows elevated lactic acid.  All elevated WBC count , patient during her previous visit had a CT scan of the abdomen which showed findings consistent with right lower lobe pneumonia.  Chest x-ray at that time was negative which is similar to this presentation.  Check chest x-ray is  currently negative.  He denies any recent travel.  She denies any visitors and she lives alone.  HOSPITAL COURSE:   Patient 68 year old with history of COPD diabetes hepatitis C hypertension hyperlipidemia presenting with cough shortness of breath  *Gastroenteritis.    Resolved.  Diarrhea resolved.  No vomiting.  *Community-acquired pneumonia  IV Rocephin and azithromycin Obtain sputum cultures follow blood culture Flu Negative. Urine positive for strep pneumonia Patient given prescription for azithromycin at discharge   *Diabetes type 2 Sliding scale insulin.    *Hyperlipidemia Resume home medications  *Essential hypertension continue home medications as taking before  *Depression anxiety continue her home medications  *Tobacco use counseled to quit on admission  Discharged home in stable condition with antibiotic prescription.  Home health physical therapy set up  CONSULTS OBTAINED:    DRUG ALLERGIES:   Allergies  Allergen Reactions  . Promethazine Other (See Comments)    studdering Other Reaction: IMPAIRS SPEECH, UNABLE TO VERB studdering Other Reaction: IMPAIRS SPEECH, UNABLE TO VERB   . Sumatriptan     Other reaction(s): Other (See Comments) Other Reaction: PANIC ATTACK  . Ciprofloxacin Other (See Comments)    Medication is contraindicated with some of pts other meds.   Delirium Delirium   . Doxycycline Nausea And Vomiting  . Imitrex [Sumatriptan Base] Other (See Comments)    Pt states that it "puts her out of this world".    . Morphine And Related Nausea And Vomiting  . Penicillins Nausea And Vomiting and Other (See Comments)  Pt is unable to answer additional questions about this medication.    . Ranitidine Hcl Other (See Comments)    studdering studdering unknown   . Zantac [Ranitidine Hcl] Other (See Comments)    Reaction:  Unknown   . Ketorolac Tromethamine Rash    DISCHARGE MEDICATIONS:   Allergies as of 10/14/2018       Reactions   Promethazine Other (See Comments)   studdering Other Reaction: IMPAIRS SPEECH, UNABLE TO VERB studdering Other Reaction: IMPAIRS SPEECH, UNABLE TO VERB   Sumatriptan    Other reaction(s): Other (See Comments) Other Reaction: PANIC ATTACK   Ciprofloxacin Other (See Comments)   Medication is contraindicated with some of pts other meds.   Delirium Delirium   Doxycycline Nausea And Vomiting   Imitrex [sumatriptan Base] Other (See Comments)   Pt states that it "puts her out of this world".     Morphine And Related Nausea And Vomiting   Penicillins Nausea And Vomiting, Other (See Comments)   Pt is unable to answer additional questions about this medication.     Ranitidine Hcl Other (See Comments)   studdering studdering unknown   Zantac [ranitidine Hcl] Other (See Comments)   Reaction:  Unknown    Ketorolac Tromethamine Rash      Medication List    TAKE these medications   acetaminophen 500 MG tablet Commonly known as:  TYLENOL Take 1,000 mg by mouth every 8 (eight) hours as needed for pain.   albuterol 108 (90 Base) MCG/ACT inhaler Commonly known as:  PROVENTIL HFA;VENTOLIN HFA Inhale 2 puffs into the lungs every 6 (six) hours as needed for wheezing or shortness of breath.   ALPRAZolam 0.5 MG tablet Commonly known as:  Xanax Take 1 tablet 3 times daily for 4 days, then 1 tablet twice daily for 4 days, then 1 tablet daily for 4 days, then 1 tablet every other day until gone   Aspirin Low Dose 81 MG EC tablet Generic drug:  aspirin TAKE ONE TABLET BY MOUTH EVERY DAY. *DO NOT CRUSH*   atorvastatin 80 MG tablet Commonly known as:  LIPITOR Take 80 mg by mouth daily.   azithromycin 250 MG tablet Commonly known as:  Zithromax Z-Pak Take 1 tablet (250 mg total) by mouth daily. What changed:    how much to take  how to take this  when to take this  additional instructions   budesonide-formoterol 80-4.5 MCG/ACT inhaler Commonly known as:   SYMBICORT Inhale 2 puffs into the lungs 2 (two) times daily.   busPIRone 15 MG tablet Commonly known as:  BUSPAR Take 15 mg by mouth 2 (two) times daily.   clopidogrel 75 MG tablet Commonly known as:  PLAVIX TAKE ONE TABLET BY MOUTH EVERY DAY WITH BREAKFAST   diclofenac sodium 1 % Gel Commonly known as:  VOLTAREN Apply 2 g topically 4 (four) times daily.   docusate sodium 100 MG capsule Commonly known as:  COLACE Take 100 mg by mouth daily.   DULoxetine 60 MG capsule Commonly known as:  CYMBALTA Take 60 mg by mouth 2 (two) times daily.   fluticasone 50 MCG/ACT nasal spray Commonly known as:  FLONASE Place 2 sprays into the nose daily.   fluticasone furoate-vilanterol 100-25 MCG/INH Aepb Commonly known as:  BREO ELLIPTA Inhale 1 puff into the lungs daily.   gabapentin 600 MG tablet Commonly known as:  NEURONTIN Take 600 mg by mouth 3 (three) times daily. May take an extra dose at night if needed  glipiZIDE 5 MG tablet Commonly known as:  GLUCOTROL Take 5 mg by mouth 2 (two) times daily before a meal.   lisinopril 20 MG tablet Commonly known as:  PRINIVIL,ZESTRIL Take 1 tablet (20 mg total) by mouth daily.   meloxicam 15 MG tablet Commonly known as:  MOBIC Take 15 mg by mouth daily.   metFORMIN 1000 MG tablet Commonly known as:  GLUCOPHAGE Take 1,000 mg by mouth 2 (two) times daily with a meal.   metoprolol succinate 50 MG 24 hr tablet Commonly known as:  TOPROL-XL Take 50 mg by mouth daily.   ondansetron 4 MG disintegrating tablet Commonly known as:  Zofran ODT Take 1 tablet (4 mg total) by mouth every 8 (eight) hours as needed for nausea or vomiting.   pantoprazole 40 MG tablet Commonly known as:  PROTONIX Take 40 mg by mouth 2 (two) times daily.   predniSONE 20 MG tablet Commonly known as:  DELTASONE Take 2 tablets (40 mg total) by mouth daily for 3 days.   QUEtiapine 25 MG tablet Commonly known as:  SEROQUEL Take 25 mg by mouth 3 (three) times  daily.   traZODone 50 MG tablet Commonly known as:  DESYREL Take 50 mg by mouth at bedtime.   ziprasidone 20 MG capsule Commonly known as:  GEODON Take 20 mg by mouth 2 (two) times daily.       Today   VITAL SIGNS:  Blood pressure (!) 133/39, pulse 75, temperature 98.1 F (36.7 C), temperature source Oral, resp. rate 17, height 5\' 2"  (1.575 m), weight 68.5 kg, SpO2 98 %.  I/O:  No intake or output data in the 24 hours ending 10/17/18 1516  PHYSICAL EXAMINATION:  Physical Exam  GENERAL:  68 y.o.-year-old patient lying in the bed with no acute distress.  LUNGS: Normal breath sounds bilaterally, no wheezing, rales,rhonchi or crepitation. No use of accessory muscles of respiration.  CARDIOVASCULAR: S1, S2 normal. No murmurs, rubs, or gallops.  ABDOMEN: Soft, non-tender, non-distended. Bowel sounds present. No organomegaly or mass.  NEUROLOGIC: Moves all 4 extremities. PSYCHIATRIC: The patient is alert and oriented x 3.  SKIN: No obvious rash, lesion, or ulcer.   DATA REVIEW:   CBC Recent Labs  Lab 10/13/18 1139  WBC 11.6*  HGB 11.1*  HCT 35.4*  PLT 350    Chemistries  Recent Labs  Lab 10/12/18 0820 10/14/18 0315  NA 136 141  K 4.5 4.1  CL 102 113*  CO2 23 21*  GLUCOSE 254* 124*  BUN 24* 20  CREATININE 1.10* 1.01*  CALCIUM 8.3* 7.4*  AST 25  --   ALT 22  --   ALKPHOS 59  --   BILITOT 1.3*  --     Cardiac Enzymes Recent Labs  Lab 10/10/18 1544  TROPONINI 0.04*    Microbiology Results  Results for orders placed or performed during the hospital encounter of 10/12/18  Culture, blood (routine x 2) Call MD if unable to obtain prior to antibiotics being given     Status: None   Collection Time: 10/12/18 12:46 PM  Result Value Ref Range Status   Specimen Description BLOOD RAC  Final   Special Requests   Final    BOTTLES DRAWN AEROBIC AND ANAEROBIC Blood Culture adequate volume   Culture   Final    NO GROWTH 5 DAYS Performed at [facility],  [address], [place], [place] [address]    Report Status 10/17/2018 FINAL  Final  Culture, blood (routine  x 2) Call MD if unable to obtain prior to antibiotics being given     Status: None   Collection Time: 10/12/18  1:56 PM  Result Value Ref Range Status   Specimen Description BLOOD RAC  Final   Special Requests   Final    BOTTLES DRAWN AEROBIC AND ANAEROBIC Blood Culture adequate volume   Culture   Final    NO GROWTH 5 DAYS Performed at [facility], [address], [place], [place] [address]    Report Status 10/17/2018 FINAL  Final    RADIOLOGY:  No results found.  Follow up with PCP in 1 week.  Management plans discussed with the patient, family and they are in agreement.  CODE STATUS:  Code Status History    Date Active Date Inactive Code Status Order ID Comments User Context   10/12/2018 1211 10/14/2018 1447 Full Code [id number]  Dustin Flock, MD Inpatient   06/28/2016 0022 06/28/2016 2152 Full Code [id number]  Harvie Bridge, DO Inpatient   05/18/2015 1507 05/19/2015 1716 Full Code [id number]  Algernon Huxley, MD Inpatient   04/06/2015 2123 04/08/2015 1702 Full Code [id number]  Bettey Costa, MD Inpatient      TOTAL TIME TAKING CARE OF THIS PATIENT ON DAY OF DISCHARGE: more than 30 minutes.   Leia Alf Ziare Cryder M.D on 10/17/2018 at 3:16 PM  Between 7am to 6pm - Pager - [phone]  After 6pm go to [email or web address] - password [email or web address] [facility]  Office  [phone]  CC: Primary care physician; Gayland Curry, MD  Note: This dictation was prepared with Dragon dictation along with smaller phrase technology. Any transcriptional errors that result from this process are unintentional.

## 2018-10-20 ENCOUNTER — Emergency Department: Payer: Medicare Other

## 2018-10-20 ENCOUNTER — Other Ambulatory Visit: Payer: Self-pay

## 2018-10-20 ENCOUNTER — Emergency Department
Admission: EM | Admit: 2018-10-20 | Discharge: 2018-10-20 | Disposition: A | Discharge status: Home / Self Care | Payer: Medicare Other | Attending: Emergency Medicine | Admitting: Emergency Medicine

## 2018-10-20 DIAGNOSIS — J449 Chronic obstructive pulmonary disease, unspecified: Secondary | ICD-10-CM | POA: Diagnosis not present

## 2018-10-20 DIAGNOSIS — I10 Essential (primary) hypertension: Secondary | ICD-10-CM | POA: Diagnosis not present

## 2018-10-20 DIAGNOSIS — F17210 Nicotine dependence, cigarettes, uncomplicated: Secondary | ICD-10-CM | POA: Diagnosis not present

## 2018-10-20 DIAGNOSIS — R55 Syncope and collapse: Secondary | ICD-10-CM | POA: Insufficient documentation

## 2018-10-20 DIAGNOSIS — F0390 Unspecified dementia without behavioral disturbance: Secondary | ICD-10-CM | POA: Diagnosis not present

## 2018-10-20 DIAGNOSIS — Z880 Allergy status to penicillin: Secondary | ICD-10-CM | POA: Insufficient documentation

## 2018-10-20 DIAGNOSIS — E119 Type 2 diabetes mellitus without complications: Secondary | ICD-10-CM | POA: Insufficient documentation

## 2018-10-20 DIAGNOSIS — Z8673 Personal history of transient ischemic attack (TIA), and cerebral infarction without residual deficits: Secondary | ICD-10-CM | POA: Insufficient documentation

## 2018-10-20 LAB — URINALYSIS, ROUTINE W REFLEX MICROSCOPIC
Bilirubin Urine: NEGATIVE
Glucose, UA: NEGATIVE mg/dL
Hgb urine dipstick: NEGATIVE
KETONES UR: NEGATIVE mg/dL
LEUKOCYTE UA: NEGATIVE
Nitrite: NEGATIVE
Protein, ur: NEGATIVE mg/dL
Specific Gravity, Urine: 1.017 (ref 1.005–1.030)
pH: 5 (ref 5.0–8.0)

## 2018-10-20 LAB — BASIC METABOLIC PANEL
Anion gap: 9 (ref 5–15)
BUN: 11 mg/dL (ref 8–23)
CO2: 22 mmol/L (ref 22–32)
Calcium: 8.1 mg/dL — ABNORMAL LOW (ref 8.9–10.3)
Chloride: 109 mmol/L (ref 98–111)
Creatinine, Ser: 0.86 mg/dL (ref 0.44–1.00)
GFR calc Af Amer: >60 mL/min (ref >60)
Glucose, Bld: 85 mg/dL (ref 70–99)
Potassium: 3.9 mmol/L (ref 3.5–5.1)
Sodium: 140 mmol/L (ref 135–145)

## 2018-10-20 LAB — TROPONIN I: Troponin I: <0.03 ng/mL (ref <0.03)

## 2018-10-20 LAB — CBC
HCT: 35 % — ABNORMAL LOW (ref 36.0–46.0)
Hemoglobin: 11.4 g/dL — ABNORMAL LOW (ref 12.0–15.0)
MCH: 31.3 pg (ref 26.0–34.0)
MCHC: 32.6 g/dL (ref 30.0–36.0)
MCV: 96.2 fL (ref 80.0–100.0)
PLATELETS: 367 10*3/uL (ref 150–400)
RBC: 3.64 MIL/uL — ABNORMAL LOW (ref 3.87–5.11)
RDW: 14.8 % (ref 11.5–15.5)
WBC: 8.9 10*3/uL (ref 4.0–10.5)
nRBC: 0 % (ref 0.0–0.2)

## 2018-10-20 NOTE — ED Triage Notes (Signed)
PT here by EMS from home. States she was sitting on the couch and then she woke up on the floor. No acute distress

## 2018-10-20 NOTE — ED Provider Notes (Signed)
[facility] Emergency Department Provider Note       Time seen: ----------------------------------------- 11:32 AM on 10/20/2018 -----------------------------------------   I have reviewed the triage vital signs and the nursing notes.  HISTORY   Chief Complaint No chief complaint on file.    HPI Tammy Armstrong is a 68 y.o. female with a history of anxiety, COPD, diabetes, hyperlipidemia, hypertension, seizure, stroke who presents to the ED for syncope.  Patient arrives here by EMS from home, states she was sitting on the couch then she woke up on the floor.  She reports this is happened to her in the past.  Recently she has been diagnosed with pneumonia.  Past Medical History:  Diagnosis Date  . Anxiety   . Arthritis   . Carotid artery occlusion   . Cigarette smoker one half pack a day or less   . COPD (chronic obstructive pulmonary disease) ([place]) 07/04/2011  . Depression   . Diabetes mellitus   . Diabetes mellitus 07/04/2011  . GERD (gastroesophageal reflux disease)   . Hepatitis C   . Hypercholesterolemia   . Hypertension   . Migraines   . Pancreatitis   . 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 Respiratory failure ([place]) 07/04/2011  . Seizure ([place]) 07/02/2011  . Shortness of breath dyspnea   . Stroke [facility])     Patient Active Problem List   Diagnosis Date Noted  . PNA (pneumonia) 10/12/2018  . Tobacco use disorder 07/13/2016  . 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 Right upper extremity numbness   . TIA (transient ischemic attack) 06/27/2016  . Carotid stenosis 04/08/2015  . Migraine 04/06/2015  . Dementia with behavioral disturbance ([place]) 01/19/2015  . Diabetes ([place]) 07/04/2011  . Anxiety 07/04/2011  . COPD (chronic obstructive pulmonary disease) ([place]) 07/04/2011  . Purulent bronchitis ([place]) 07/04/2011  . 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 ABDOMINAL HYSTERECTOMY    .  APPENDECTOMY    . BACK SURGERY  2012   cyst removed from spine , [place]  . CHOLECYSTECTOMY    . ENDARTERECTOMY Left 05/18/2015   Procedure: ENDARTERECTOMY CAROTID;  Surgeon: Algernon Huxley, MD;  Location: [facility] ORS;  Service: Vascular;  Laterality: Left;    Allergies Promethazine; Sumatriptan; Ciprofloxacin; Doxycycline; Imitrex [sumatriptan base]; Morphine and related; Penicillins; Ranitidine hcl; Zantac [ranitidine hcl]; and Ketorolac tromethamine  Social History Social History   Tobacco Use  . Smoking status: Current Every Day Smoker    Packs/day: 1.00    Years: 52.00    Pack years: 52.00    Types: Cigarettes  . Smokeless tobacco: Never Used  Substance Use Topics  . Alcohol use: No  . Drug use: No   Review of Systems Constitutional: Negative for fever. Cardiovascular: Negative for chest pain. Respiratory: Positive for shortness of breath Gastrointestinal: Negative for abdominal pain, vomiting and diarrhea. Musculoskeletal: Negative for back pain. Skin: Negative for rash. Neurological: Negative for headaches, focal weakness or numbness.  All systems negative/normal/unremarkable except as stated in the HPI  ____________________________________________   PHYSICAL EXAM:  VITAL SIGNS: ED Triage Vitals  Enc Vitals Group     BP 10/20/18 1123 (!) 191/64     Pulse Rate 10/20/18 1123 72     Resp 10/20/18 1123 16     Temp 10/20/18 1123 99.1 F (37.3 C)     Temp Source 10/20/18 1123 Oral     SpO2 10/20/18 1123 98 %     Weight  10/20/18 1126 161 lb (73 kg)     Height 10/20/18 1126 5\' 2"  (1.575 m)     Head Circumference --      Peak Flow --      Pain Score 10/20/18 1125 0     Pain Loc --      Pain Edu? --      Excl. in [place]? --    Constitutional: Alert and oriented. Well appearing and in no distress. Eyes: Conjunctivae are normal. Normal extraocular movements. ENT      Head: Normocephalic and atraumatic.      Nose: No congestion/rhinnorhea.      Mouth/Throat: Mucous  membranes are moist.      Neck: No stridor. Cardiovascular: Normal rate, regular rhythm. No murmurs, rubs, or gallops. Respiratory: Normal respiratory effort without tachypnea nor retractions. Breath sounds are clear and equal bilaterally. No wheezes/rales/rhonchi. Gastrointestinal: Soft and nontender. Normal bowel sounds Musculoskeletal: Nontender with normal range of motion in extremities. No lower extremity tenderness nor edema. Neurologic:  Normal speech and language. No gross focal neurologic deficits are appreciated.  Skin:  Skin is warm, dry and intact. No rash noted. Psychiatric: Mood and affect are normal. Speech and behavior are normal.  ____________________________________________  EKG: Interpreted by me.  Sinus rhythm the rate of 68 bpm, normal PR interval, normal QRS, normal QT  ____________________________________________  ED COURSE:  As part of my medical decision making, I reviewed the following data within the [place] History obtained from family if available, nursing notes, old chart and ekg, as well as notes from prior ED visits. Patient presented for syncope, we will assess with labs and imaging as indicated at this time.   Procedures [name] was evaluated in Emergency Department on 10/20/2018 for the symptoms described in the history of present illness. She was evaluated in the context of the global COVID-19 pandemic, which necessitated consideration that the patient might be at risk for infection with the SARS-CoV-2 virus that causes COVID-19. Institutional protocols and algorithms that pertain to the evaluation of patients at risk for COVID-19 are in a state of rapid change based on information released by regulatory bodies including the CDC and federal and state organizations. These policies and algorithms were followed during the patient's care in the ED.  ____________________________________________   LABS (pertinent  positives/negatives)  Labs Reviewed  BASIC METABOLIC PANEL - Abnormal; Notable for the following components:      Result Value   Calcium 8.1 (*)    All other components within normal limits  CBC - Abnormal; Notable for the following components:   RBC 3.64 (*)    Hemoglobin 11.4 (*)    HCT 35.0 (*)    All other components within normal limits  URINALYSIS, ROUTINE W REFLEX MICROSCOPIC - Abnormal; Notable for the following components:   Color, Urine YELLOW (*)    APPearance HAZY (*)    All other components within normal limits  TROPONIN I    RADIOLOGY Images were viewed by me  Chest x-ray IMPRESSION: Minimal chronic bronchitic changes without infiltrate. ____________________________________________   DIFFERENTIAL DIAGNOSIS   Syncope, dehydration, arrhythmia, electrolyte abnormality, occult infection  FINAL ASSESSMENT AND PLAN  Syncope   Plan: The patient had presented for unexplained syncope. Patient's labs did not reveal any acute process, there was no obvious sign of pneumonia. Patient's imaging was reassuring.  Patient does not want to stay in the hospital, I will encourage close outpatient follow-up with her doctor.   Johnathan E  Jimmye Norman, MD    Note: This note was generated in part or whole with voice recognition software. Voice recognition is usually quite accurate but there are transcription errors that can and very often do occur. I apologize for any typographical errors that were not detected and corrected.     Earleen Newport, MD 10/20/18 [phone]

## 2018-10-20 NOTE — ED Notes (Signed)
Pt found walking in the hall looking for exit, states she had been discharged and was ready to leave. Discharge paperwork given and teaching done in hall, pt did not sign

## 2018-11-10 DIAGNOSIS — Z139 Encounter for screening, unspecified: Secondary | ICD-10-CM | POA: Insufficient documentation

## 2018-11-10 DIAGNOSIS — K7469 Other cirrhosis of liver: Secondary | ICD-10-CM | POA: Insufficient documentation

## 2018-11-12 ENCOUNTER — Encounter: Payer: Self-pay | Admitting: *Deleted

## 2019-01-01 ENCOUNTER — Ambulatory Visit: Payer: Medicare Other | Admitting: Gastroenterology

## 2019-01-15 ENCOUNTER — Encounter (INDEPENDENT_AMBULATORY_CARE_PROVIDER_SITE_OTHER): Payer: Medicare Other

## 2019-01-15 ENCOUNTER — Ambulatory Visit (INDEPENDENT_AMBULATORY_CARE_PROVIDER_SITE_OTHER): Payer: Medicare Other | Admitting: Nurse Practitioner

## 2019-01-15 ENCOUNTER — Encounter (INDEPENDENT_AMBULATORY_CARE_PROVIDER_SITE_OTHER): Payer: Self-pay

## 2019-01-19 ENCOUNTER — Encounter: Payer: Self-pay | Admitting: *Deleted

## 2019-01-19 ENCOUNTER — Other Ambulatory Visit: Payer: Self-pay

## 2019-01-19 ENCOUNTER — Emergency Department
Admission: EM | Admit: 2019-01-19 | Discharge: 2019-01-19 | Disposition: A | Discharge status: Home / Self Care | Payer: Medicare Other | Attending: Student in an Organized Health Care Education/Training Program | Admitting: Student in an Organized Health Care Education/Training Program

## 2019-01-19 ENCOUNTER — Emergency Department
Admission: EM | Admit: 2019-01-19 | Discharge: 2019-01-19 | Discharge status: Against Medical Advice | Payer: Medicare Other

## 2019-01-19 ENCOUNTER — Emergency Department: Payer: Medicare Other

## 2019-01-19 DIAGNOSIS — Z7901 Long term (current) use of anticoagulants: Secondary | ICD-10-CM | POA: Diagnosis not present

## 2019-01-19 DIAGNOSIS — J449 Chronic obstructive pulmonary disease, unspecified: Secondary | ICD-10-CM | POA: Diagnosis not present

## 2019-01-19 DIAGNOSIS — F0391 Unspecified dementia with behavioral disturbance: Secondary | ICD-10-CM | POA: Diagnosis not present

## 2019-01-19 DIAGNOSIS — Z79899 Other long term (current) drug therapy: Secondary | ICD-10-CM | POA: Insufficient documentation

## 2019-01-19 DIAGNOSIS — Z20828 Contact with and (suspected) exposure to other viral communicable diseases: Secondary | ICD-10-CM | POA: Insufficient documentation

## 2019-01-19 DIAGNOSIS — R112 Nausea with vomiting, unspecified: Secondary | ICD-10-CM | POA: Insufficient documentation

## 2019-01-19 DIAGNOSIS — Z8673 Personal history of transient ischemic attack (TIA), and cerebral infarction without residual deficits: Secondary | ICD-10-CM | POA: Diagnosis not present

## 2019-01-19 DIAGNOSIS — R110 Nausea: Secondary | ICD-10-CM

## 2019-01-19 DIAGNOSIS — E119 Type 2 diabetes mellitus without complications: Secondary | ICD-10-CM | POA: Insufficient documentation

## 2019-01-19 DIAGNOSIS — Z7984 Long term (current) use of oral hypoglycemic drugs: Secondary | ICD-10-CM | POA: Insufficient documentation

## 2019-01-19 DIAGNOSIS — F17210 Nicotine dependence, cigarettes, uncomplicated: Secondary | ICD-10-CM | POA: Insufficient documentation

## 2019-01-19 LAB — CBC
HCT: 38.7 % (ref 36.0–46.0)
Hemoglobin: 12.6 g/dL (ref 12.0–15.0)
MCH: 29.9 pg (ref 26.0–34.0)
MCHC: 32.6 g/dL (ref 30.0–36.0)
MCV: 91.7 fL (ref 80.0–100.0)
Platelets: 234 10*3/uL (ref 150–400)
RBC: 4.22 MIL/uL (ref 3.87–5.11)
RDW: 14.4 % (ref 11.5–15.5)
WBC: 9.8 10*3/uL (ref 4.0–10.5)
nRBC: 0 % (ref 0.0–0.2)

## 2019-01-19 LAB — COMPREHENSIVE METABOLIC PANEL
ALT: 16 U/L (ref 0–44)
AST: 25 U/L (ref 15–41)
Albumin: 3.9 g/dL (ref 3.5–5.0)
Alkaline Phosphatase: 65 U/L (ref 38–126)
Anion gap: 12 (ref 5–15)
BUN: 13 mg/dL (ref 8–23)
CO2: 24 mmol/L (ref 22–32)
Calcium: 9.4 mg/dL (ref 8.9–10.3)
Chloride: 106 mmol/L (ref 98–111)
Creatinine, Ser: 0.83 mg/dL (ref 0.44–1.00)
GFR calc Af Amer: >60 mL/min (ref >60)
GFR calc non Af Amer: >60 mL/min (ref >60)
Glucose, Bld: 82 mg/dL (ref 70–99)
Potassium: 4.3 mmol/L (ref 3.5–5.1)
Sodium: 142 mmol/L (ref 135–145)
Total Bilirubin: 0.7 mg/dL (ref 0.3–1.2)
Total Protein: 7.3 g/dL (ref 6.5–8.1)

## 2019-01-19 LAB — URINALYSIS, COMPLETE (UACMP) WITH MICROSCOPIC
Bacteria, UA: NONE SEEN
Bilirubin Urine: NEGATIVE
Glucose, UA: NEGATIVE mg/dL
Hgb urine dipstick: NEGATIVE
Ketones, ur: 5 mg/dL — AB
Nitrite: NEGATIVE
Protein, ur: 30 mg/dL — AB
Specific Gravity, Urine: 1.023 (ref 1.005–1.030)
pH: 5 (ref 5.0–8.0)

## 2019-01-19 LAB — LIPASE, BLOOD: Lipase: 30 U/L (ref 11–51)

## 2019-01-19 MED ORDER — ONDANSETRON HCL 4 MG PO TABS
4.0000 mg | ORAL_TABLET | Freq: Once | ORAL | Status: AC
  Administered 2019-01-19: 4 mg via ORAL
  Filled 2019-01-19: qty 1

## 2019-01-19 MED ORDER — ONDANSETRON HCL 4 MG PO TABS: 4.0000 mg | ORAL_TABLET | Freq: Every day | ORAL | 0 refills | Status: DC | PRN

## 2019-01-19 MED ORDER — SODIUM CHLORIDE 0.9% FLUSH: 3.0000 mL | Freq: Once | INTRAVENOUS | Status: DC

## 2019-01-19 NOTE — ED Notes (Signed)
COVID swab obtained and sent. IV heplock placed to rt fa, labs drawn and sent.

## 2019-01-19 NOTE — ED Triage Notes (Signed)
Pt brought in via ems from home.  Pt has abd pain.  Pt was seen here this am for same and left without any test being done.  Pt has vomiting and diarrhea.  Pt alert.

## 2019-01-19 NOTE — ED Provider Notes (Signed)
[facility] Emergency Department Provider Note    First MD Initiated Contact with Patient 01/19/19 1910     (approximate)  I have reviewed the triage vital signs and the nursing notes.   HISTORY  Chief Complaint Abdominal Pain    HPI Tammy Armstrong is a 68 y.o. female the below listed past medical history presents the ER for 4 days of nausea vomiting diarrhea cough decrease in smell and taste and chills.  States she is having some epigastric discomfort that is mild.  Denies any pain radiating through to her back.  No flank pain.  No dysuria.  No bloody diarrhea.  No bloody emesis or coffee-ground emesis.  Denies any shortness of breath no sick contacts.  Is not been on any antibiotics recently.  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   Respiratory failure ([place]) 07/04/2011   Seizure ([place]) 07/02/2011   Shortness of breath dyspnea    Stroke ([place])    Family History  Problem Relation Age of Onset   Hypertension Mother    Heart attack Mother    Diabetes Mother    Anxiety disorder Mother    Stroke Mother    Diabetes Father    Diabetes Brother    Alcohol abuse Other        siblings   Hypertension Brother    Breast cancer Neg Hx    Past Surgical History:  Procedure Laterality Date   ABDOMINAL HYSTERECTOMY     APPENDECTOMY     BACK SURGERY  2012   cyst removed from spine , [place]   CHOLECYSTECTOMY     ENDARTERECTOMY Left 05/18/2015   Procedure: ENDARTERECTOMY CAROTID;   Surgeon: Algernon Huxley, MD;  Location: [facility] ORS;  Service: Vascular;  Laterality: Left;   Patient Active Problem List   Diagnosis Date Noted   PNA (pneumonia) 10/12/2018   Tobacco use disorder 07/13/2016   Carotid aneurysm, 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 Authorizing Provider  acetaminophen (TYLENOL) 500 MG tablet Take 1,000 mg by mouth every 8 (eight) hours as needed for pain. 04/25/18   [provider]  albuterol (PROVENTIL HFA;VENTOLIN HFA) 108 (90 BASE) MCG/ACT inhaler Inhale 2 puffs into the lungs every 6 (six) hours as needed for wheezing or shortness of breath.     [provider]  ALPRAZolam Duanne Moron) 0.5 MG tablet Take 1 tablet 3 times daily for 4 days, then 1 tablet twice daily for 4 days, then 1 tablet daily for 4 days, then 1 tablet  every other day until gone Patient not taking: Reported on 10/13/2018 04/02/16   Earleen Newport, MD  ASPIRIN LOW DOSE 81 MG EC tablet TAKE ONE TABLET BY MOUTH EVERY DAY. *DO NOT CRUSH* 03/08/17   Algernon Huxley, MD  atorvastatin (LIPITOR) 80 MG tablet Take 80 mg by mouth daily. 05/16/18 05/16/19  [provider]  azithromycin (ZITHROMAX Z-PAK) 250 MG tablet Take 1 tablet (250 mg total) by mouth daily. 10/14/18   Hillary Bow, MD  budesonide-formoterol (SYMBICORT) 80-4.5 MCG/ACT inhaler Inhale 2 puffs into the lungs 2 (two) times daily. 04/08/15   Gladstone Lighter, MD  busPIRone (BUSPAR) 15 MG tablet Take 15 mg by mouth 2 (two) times daily. 07/23/16   [provider]  clopidogrel (PLAVIX) 75 MG tablet TAKE ONE TABLET BY MOUTH EVERY DAY WITH BREAKFAST 11/13/16   Algernon Huxley, MD  diclofenac sodium (VOLTAREN) 1 % GEL Apply 2 g topically 4 (four) times daily. 01/10/17   [provider]  docusate sodium (COLACE) 100 MG capsule Take 100 mg by mouth daily.     [provider]  DULoxetine (CYMBALTA) 60 MG capsule Take 60 mg by mouth 2 (two) times daily.    [provider]  fluticasone (FLONASE) 50 MCG/ACT nasal spray Place 2 sprays into the nose daily. 10/23/17 10/23/18  [provider]  fluticasone furoate-vilanterol (BREO ELLIPTA) 100-25 MCG/INH AEPB Inhale 1 puff into the lungs daily.    [provider]  gabapentin (NEURONTIN) 600 MG tablet Take 600 mg by mouth 3 (three) times daily. May take an extra dose at night if needed 05/09/18   [provider]  glipiZIDE (GLUCOTROL) 5 MG tablet Take 5 mg by mouth 2 (two) times daily before a meal. 05/09/18 05/09/19  [provider]  lisinopril (PRINIVIL,ZESTRIL) 20 MG tablet Take 1 tablet (20 mg total) by mouth daily. 04/02/16   Earleen Newport, MD  meloxicam (MOBIC) 15 MG tablet Take 15 mg by mouth daily.     [provider]  metFORMIN (GLUCOPHAGE) 1000 MG tablet Take 1,000 mg by mouth 2 (two) times daily with a meal. 05/09/18 05/09/19  [provider]  metoprolol (TOPROL-XL) 50 MG 24 hr tablet Take 50 mg by mouth daily.      [provider]  ondansetron (ZOFRAN ODT) 4 MG disintegrating tablet Take 1 tablet (4 mg total) by mouth every 8 (eight) hours as needed for nausea or vomiting. 10/10/18   Harvest Dark, MD  ondansetron (ZOFRAN) 4 MG tablet Take 1 tablet (4 mg total) by mouth daily as needed. 01/19/19 01/19/20  Merlyn Lot, MD  pantoprazole (PROTONIX) 40 MG tablet Take 40 mg by mouth 2 (two) times daily.    [provider]  QUEtiapine (SEROQUEL) 25 MG tablet Take 25 mg by mouth 3 (three) times daily.      [provider]  traZODone (DESYREL) 50 MG tablet Take 50 mg by mouth at bedtime.    [provider]  ziprasidone (GEODON) 20 MG capsule Take 20 mg by mouth 2 (two) times daily. 08/23/18   [provider]    Allergies Promethazine, Sumatriptan, Ciprofloxacin, Doxycycline, Imitrex [sumatriptan base], Morphine and related, Penicillins, Ranitidine hcl, Zantac [ranitidine hcl], and Ketorolac tromethamine    Social History Social History   Tobacco Use   Smoking status: Current Every Day Smoker    Packs/day: 1.00    Years: 52.00    Pack years: 52.00    Types: Cigarettes   Smokeless tobacco: Never  Used  Substance Use Topics   Alcohol use: No   Drug use: No    Review of Systems Patient denies headaches, rhinorrhea, blurry vision, numbness, shortness of breath, chest pain, edema, cough, abdominal pain, nausea, vomiting, diarrhea, dysuria, fevers, rashes or hallucinations unless otherwise stated above in HPI. ____________________________________________   PHYSICAL EXAM:  VITAL SIGNS: Vitals:   01/19/19 1943 01/19/19 2148  BP: (!) 188/60 (!) 180/76  Pulse: 69 68  Resp: 20 18  Temp:    SpO2: 99% 99%    Constitutional: Alert and oriented.  Eyes: Conjunctivae are normal.  Head: Atraumatic. Nose: No congestion/rhinnorhea. Mouth/Throat: Mucous membranes are moist.   Neck: No stridor. Painless ROM.  Cardiovascular: Normal rate, regular rhythm. Grossly normal heart sounds.  Good peripheral circulation. Respiratory: Normal respiratory effort.  No retractions. Lungs CTAB. Gastrointestinal: Soft and nontender in all four quadrants.  No distention. No abdominal bruits. No CVA tenderness. Genitourinary: deferred Musculoskeletal: No lower extremity tenderness nor edema.  No joint effusions. Neurologic:  Normal speech and language. No gross focal neurologic deficits are appreciated. No facial droop Skin:  Skin is warm, dry and intact. No rash  noted. Psychiatric: Mood and affect are normal. Speech and behavior are normal.  ____________________________________________   LABS (all labs ordered are listed, but only abnormal results are displayed)  Results for orders placed or performed during the hospital encounter of 01/19/19 (from the past 24 hour(s))  Lipase, blood     Status: None   Collection Time: 01/19/19  4:08 PM  Result Value Ref Range   Lipase 30 11 - 51 U/L  Comprehensive metabolic panel     Status: None   Collection Time: 01/19/19  4:08 PM  Result Value Ref Range   Sodium 142 135 - 145 mmol/L   Potassium 4.3 3.5 - 5.1 mmol/L   Chloride 106 98 - 111 mmol/L   CO2 24 22 - 32 mmol/L   Glucose, Bld 82 70 - 99 mg/dL   BUN 13 8 - 23 mg/dL   Creatinine, Ser 0.83 0.44 - 1.00 mg/dL   Calcium 9.4 8.9 - 10.3 mg/dL   Total Protein 7.3 6.5 - 8.1 g/dL   Albumin 3.9 3.5 - 5.0 g/dL   AST 25 15 - 41 U/L   ALT 16 0 - 44 U/L   Alkaline Phosphatase 65 38 - 126 U/L   Total Bilirubin 0.7 0.3 - 1.2 mg/dL   GFR calc non Af Amer >60 >60 mL/min   GFR calc Af Amer >60 >60 mL/min   Anion gap 12 5 - 15  CBC     Status: None   Collection Time: 01/19/19  4:08 PM  Result Value Ref Range   WBC 9.8 4.0 - 10.5 K/uL   RBC 4.22 3.87 - 5.11 MIL/uL   Hemoglobin 12.6 12.0 - 15.0 g/dL   HCT 38.7 36.0 - 46.0 %   MCV 91.7 80.0 - 100.0 fL   MCH 29.9 26.0 - 34.0 pg   MCHC 32.6 30.0 - 36.0 g/dL   RDW 14.4 11.5 - 15.5 %   Platelets 234 150 - 400 K/uL   nRBC 0.0 0.0 - 0.2 %  Urinalysis, Complete w Microscopic     Status: Abnormal   Collection Time: 01/19/19  8:00 PM  Result Value Ref Range   Color, Urine YELLOW (A) YELLOW   APPearance HAZY (A) CLEAR   Specific Gravity, Urine 1.023 1.005 - 1.030   pH 5.0 5.0 - 8.0   Glucose, UA NEGATIVE  NEGATIVE mg/dL   Hgb urine dipstick NEGATIVE NEGATIVE   Bilirubin Urine NEGATIVE NEGATIVE   Ketones, ur 5 (A) NEGATIVE mg/dL   Protein, ur 30 (A) NEGATIVE mg/dL   Nitrite NEGATIVE NEGATIVE   Leukocytes,Ua  SMALL (A) NEGATIVE   RBC / HPF 0-5 0 - 5 RBC/hpf   WBC, UA 6-10 0 - 5 WBC/hpf   Bacteria, UA NONE SEEN NONE SEEN   Squamous Epithelial / LPF 0-5 0 - 5   Mucus PRESENT    ____________________________________________  EKG My review and personal interpretation at Time: 19:48   Indication: nausea  Rate: 70  Rhythm: sinus Axis: normal Other: normal intervals, no stemi ____________________________________________  RADIOLOGY  I personally reviewed all radiographic images ordered to evaluate for the above acute complaints and reviewed radiology reports and findings.  These findings were personally discussed with the patient.  Please see medical record for radiology report.  ____________________________________________   PROCEDURES  Procedure(s) performed:  Procedures    Critical Care performed: no ____________________________________________   INITIAL IMPRESSION / ASSESSMENT AND PLAN / ED COURSE  Pertinent labs & imaging results that were available during my care of the patient were reviewed by me and considered in my medical decision making (see chart for details).   DDX: Enteritis, viral illness, UTI, obstruction, pancreatitis, gastritis  DORETHEA STRUBEL is a 68 y.o. who presents to the ED with symptoms as described above.  Patient afebrile hemodynamically stable.  Well-appearing.  Abdominal exam soft and benign.  Blood work is sent for the above differential was reassuring.  Given lack of white count tachycardia or fever do not feel that CT imaging clinically indicated particularly with benign appearing acute abdominal series.  She is tolerating oral hydration able to ambulate with a steady gait.  Given constellation of symptoms coronavirus test was sent but as she is not hypoxic and otherwise well-appearing I do believe she stable appropriate for outpatient management.    The patient was evaluated in Emergency Department today for the symptoms described in the history of present  illness. He/she was evaluated in the context of the global COVID-19 pandemic, which necessitated consideration that the patient might be at risk for infection with the SARS-CoV-2 virus that causes COVID-19. Institutional protocols and algorithms that pertain to the evaluation of patients at risk for COVID-19 are in a state of rapid change based on information released by regulatory bodies including the CDC and federal and state organizations. These policies and algorithms were followed during the patient's care in the ED.   As part of my medical decision making, I reviewed the following data within the [place] notes reviewed and incorporated, Labs reviewed, notes from prior ED visits and [place] Controlled Substance Database   ____________________________________________   FINAL CLINICAL IMPRESSION(S) / ED DIAGNOSES  Final diagnoses:  Nausea      NEW MEDICATIONS STARTED DURING THIS VISIT:  Discharge Medication List as of 01/19/2019 10:39 PM    START taking these medications   Details  ondansetron (ZOFRAN) 4 MG tablet Take 1 tablet (4 mg total) by mouth daily as needed., Starting Mon 01/19/2019, Until Tue 01/19/2020, Normal         Note:  This document was prepared using Dragon voice recognition software and may include unintentional dictation errors.    Merlyn Lot, MD 01/19/19 2322

## 2019-01-19 NOTE — ED Triage Notes (Signed)
pt arrives to ed via ems from home. Pt arrives to room in wheelchair. C/O n/v and loss of appetite. no fever cough, headache, dizziness, loss of appetite and taste, SOB x 3 days 190/80. 70HR,98.4 temp. 96% room air, 123 CBG. NAD noted at this time. pt request doors to be open, and states if shut she is leaving ED. pt refusing to stay if doors have to be closed. pt informed due to symptoms door has to remain closed. pt alert&o x 4, ambulatory when leaving out of side door from room 11, no resp distress noted at this time. Unable to complete full triage due to pt leaving facility.

## 2019-01-19 NOTE — ED Notes (Signed)
Pt unable to void at this time. 

## 2019-01-19 NOTE — ED Notes (Signed)
Urine and urine culture obtained and sent

## 2019-01-19 NOTE — ED Notes (Signed)
Pt has skin tear to left forearm.  Pt states while getting on ambulance this am, pt hit her arm on the ambulance door.   Bleeding controlled.

## 2019-01-21 LAB — NOVEL CORONAVIRUS, NAA (HOSP ORDER, SEND-OUT TO REF LAB; TAT 18-24 HRS): SARS-CoV-2, NAA: NOT DETECTED

## 2019-01-22 ENCOUNTER — Telehealth: Payer: Self-pay | Admitting: Emergency Medicine

## 2019-01-22 NOTE — Telephone Encounter (Signed)
Called patient due to lwot to inquire about condition and follow up plans. Number did not work

## 2019-05-13 DIAGNOSIS — T39395S Adverse effect of other nonsteroidal anti-inflammatory drugs [NSAID], sequela: Secondary | ICD-10-CM | POA: Insufficient documentation

## 2019-05-26 ENCOUNTER — Encounter: Payer: Self-pay | Admitting: *Deleted

## 2019-05-26 ENCOUNTER — Other Ambulatory Visit: Payer: Self-pay

## 2019-05-26 ENCOUNTER — Emergency Department: Payer: Medicare Other

## 2019-05-26 DIAGNOSIS — E860 Dehydration: Secondary | ICD-10-CM | POA: Diagnosis not present

## 2019-05-26 DIAGNOSIS — Z8673 Personal history of transient ischemic attack (TIA), and cerebral infarction without residual deficits: Secondary | ICD-10-CM | POA: Diagnosis not present

## 2019-05-26 DIAGNOSIS — E7800 Pure hypercholesterolemia, unspecified: Secondary | ICD-10-CM | POA: Diagnosis not present

## 2019-05-26 DIAGNOSIS — Z7982 Long term (current) use of aspirin: Secondary | ICD-10-CM | POA: Diagnosis not present

## 2019-05-26 DIAGNOSIS — F0391 Unspecified dementia with behavioral disturbance: Secondary | ICD-10-CM | POA: Insufficient documentation

## 2019-05-26 DIAGNOSIS — J449 Chronic obstructive pulmonary disease, unspecified: Secondary | ICD-10-CM | POA: Insufficient documentation

## 2019-05-26 DIAGNOSIS — Z7902 Long term (current) use of antithrombotics/antiplatelets: Secondary | ICD-10-CM | POA: Insufficient documentation

## 2019-05-26 DIAGNOSIS — K219 Gastro-esophageal reflux disease without esophagitis: Secondary | ICD-10-CM | POA: Insufficient documentation

## 2019-05-26 DIAGNOSIS — R55 Syncope and collapse: Secondary | ICD-10-CM | POA: Diagnosis present

## 2019-05-26 DIAGNOSIS — F419 Anxiety disorder, unspecified: Secondary | ICD-10-CM | POA: Insufficient documentation

## 2019-05-26 DIAGNOSIS — I951 Orthostatic hypotension: Secondary | ICD-10-CM | POA: Insufficient documentation

## 2019-05-26 DIAGNOSIS — Z7951 Long term (current) use of inhaled steroids: Secondary | ICD-10-CM | POA: Insufficient documentation

## 2019-05-26 DIAGNOSIS — K2900 Acute gastritis without bleeding: Secondary | ICD-10-CM | POA: Insufficient documentation

## 2019-05-26 DIAGNOSIS — F329 Major depressive disorder, single episode, unspecified: Secondary | ICD-10-CM | POA: Insufficient documentation

## 2019-05-26 DIAGNOSIS — Z20828 Contact with and (suspected) exposure to other viral communicable diseases: Secondary | ICD-10-CM | POA: Diagnosis not present

## 2019-05-26 DIAGNOSIS — E119 Type 2 diabetes mellitus without complications: Secondary | ICD-10-CM | POA: Insufficient documentation

## 2019-05-26 DIAGNOSIS — I720 Aneurysm of carotid artery: Secondary | ICD-10-CM | POA: Diagnosis not present

## 2019-05-26 DIAGNOSIS — N179 Acute kidney failure, unspecified: Secondary | ICD-10-CM | POA: Insufficient documentation

## 2019-05-26 DIAGNOSIS — F17210 Nicotine dependence, cigarettes, uncomplicated: Secondary | ICD-10-CM | POA: Diagnosis not present

## 2019-05-26 DIAGNOSIS — I10 Essential (primary) hypertension: Secondary | ICD-10-CM | POA: Insufficient documentation

## 2019-05-26 DIAGNOSIS — Z791 Long term (current) use of non-steroidal anti-inflammatories (NSAID): Secondary | ICD-10-CM | POA: Insufficient documentation

## 2019-05-26 DIAGNOSIS — I6529 Occlusion and stenosis of unspecified carotid artery: Secondary | ICD-10-CM | POA: Insufficient documentation

## 2019-05-26 DIAGNOSIS — Z79899 Other long term (current) drug therapy: Secondary | ICD-10-CM | POA: Insufficient documentation

## 2019-05-26 LAB — CBC
HCT: 39.3 % (ref 36.0–46.0)
Hemoglobin: 12.9 g/dL (ref 12.0–15.0)
MCH: 31.6 pg (ref 26.0–34.0)
MCHC: 32.8 g/dL (ref 30.0–36.0)
MCV: 96.3 fL (ref 80.0–100.0)
Platelets: 319 10*3/uL (ref 150–400)
RBC: 4.08 MIL/uL (ref 3.87–5.11)
RDW: 13.3 % (ref 11.5–15.5)
WBC: 14.6 10*3/uL — ABNORMAL HIGH (ref 4.0–10.5)
nRBC: 0 % (ref 0.0–0.2)

## 2019-05-26 LAB — BASIC METABOLIC PANEL
Anion gap: 13 (ref 5–15)
BUN: 31 mg/dL — ABNORMAL HIGH (ref 8–23)
CO2: 21 mmol/L — ABNORMAL LOW (ref 22–32)
Calcium: 9.1 mg/dL (ref 8.9–10.3)
Chloride: 102 mmol/L (ref 98–111)
Creatinine, Ser: 2.01 mg/dL — ABNORMAL HIGH (ref 0.44–1.00)
GFR calc Af Amer: 29 mL/min — ABNORMAL LOW (ref >60)
GFR calc non Af Amer: 25 mL/min — ABNORMAL LOW (ref >60)
Glucose, Bld: 205 mg/dL — ABNORMAL HIGH (ref 70–99)
Potassium: 4 mmol/L (ref 3.5–5.1)
Sodium: 136 mmol/L (ref 135–145)

## 2019-05-26 LAB — TROPONIN I (HIGH SENSITIVITY): Troponin I (High Sensitivity): 21 ng/L — ABNORMAL HIGH (ref <18)

## 2019-05-26 MED ORDER — SODIUM CHLORIDE 0.9% FLUSH: 3.0000 mL | Freq: Once | INTRAVENOUS | Status: DC

## 2019-05-26 NOTE — ED Triage Notes (Signed)
Pt brought in via ems from home.  Pt reports abd pain and vomiting.  Pt also reports dizziness.  No chest pain or sob. Pt also has chills.  Pt had a syncopal episode 2 days ago.  Pt has a hematoma to back of head with an abrasion.  Pt alert   Speech clear.

## 2019-05-27 ENCOUNTER — Emergency Department: Payer: Medicare Other

## 2019-05-27 ENCOUNTER — Encounter: Payer: Self-pay | Admitting: Internal Medicine

## 2019-05-27 ENCOUNTER — Other Ambulatory Visit: Payer: Self-pay

## 2019-05-27 ENCOUNTER — Observation Stay
Admission: EM | Admit: 2019-05-27 | Discharge: 2019-05-28 | Disposition: A | Discharge status: Home / Self Care | Payer: Medicare Other | Attending: Internal Medicine | Admitting: Internal Medicine

## 2019-05-27 DIAGNOSIS — F0391 Unspecified dementia with behavioral disturbance: Secondary | ICD-10-CM

## 2019-05-27 DIAGNOSIS — R112 Nausea with vomiting, unspecified: Secondary | ICD-10-CM | POA: Diagnosis not present

## 2019-05-27 DIAGNOSIS — N179 Acute kidney failure, unspecified: Secondary | ICD-10-CM | POA: Diagnosis not present

## 2019-05-27 DIAGNOSIS — I10 Essential (primary) hypertension: Secondary | ICD-10-CM | POA: Diagnosis present

## 2019-05-27 DIAGNOSIS — R1010 Upper abdominal pain, unspecified: Secondary | ICD-10-CM

## 2019-05-27 DIAGNOSIS — J449 Chronic obstructive pulmonary disease, unspecified: Secondary | ICD-10-CM | POA: Diagnosis not present

## 2019-05-27 DIAGNOSIS — R55 Syncope and collapse: Secondary | ICD-10-CM

## 2019-05-27 DIAGNOSIS — F03918 Unspecified dementia, unspecified severity, with other behavioral disturbance: Secondary | ICD-10-CM | POA: Diagnosis present

## 2019-05-27 LAB — URINALYSIS, COMPLETE (UACMP) WITH MICROSCOPIC
Bacteria, UA: NONE SEEN
Bilirubin Urine: NEGATIVE
Glucose, UA: NEGATIVE mg/dL
Hgb urine dipstick: NEGATIVE
Ketones, ur: NEGATIVE mg/dL
Leukocytes,Ua: NEGATIVE
Nitrite: NEGATIVE
Protein, ur: 30 mg/dL — AB
Specific Gravity, Urine: 1.025 (ref 1.005–1.030)
pH: 5 (ref 5.0–8.0)

## 2019-05-27 LAB — HEPATIC FUNCTION PANEL
ALT: 12 U/L (ref 0–44)
AST: 36 U/L (ref 15–41)
Albumin: 3.8 g/dL (ref 3.5–5.0)
Alkaline Phosphatase: 53 U/L (ref 38–126)
Bilirubin, Direct: 0.2 mg/dL (ref 0.0–0.2)
Indirect Bilirubin: 0.4 mg/dL (ref 0.3–0.9)
Total Bilirubin: 0.6 mg/dL (ref 0.3–1.2)
Total Protein: 7.2 g/dL (ref 6.5–8.1)

## 2019-05-27 LAB — LIPASE, BLOOD: Lipase: 44 U/L (ref 11–51)

## 2019-05-27 LAB — HIV ANTIBODY (ROUTINE TESTING W REFLEX): HIV Screen 4th Generation wRfx: NONREACTIVE

## 2019-05-27 LAB — CBC
HCT: 33.4 % — ABNORMAL LOW (ref 36.0–46.0)
Hemoglobin: 11.3 g/dL — ABNORMAL LOW (ref 12.0–15.0)
MCH: 31.7 pg (ref 26.0–34.0)
MCHC: 33.8 g/dL (ref 30.0–36.0)
MCV: 93.8 fL (ref 80.0–100.0)
Platelets: 276 10*3/uL (ref 150–400)
RBC: 3.56 MIL/uL — ABNORMAL LOW (ref 3.87–5.11)
RDW: 13.5 % (ref 11.5–15.5)
WBC: 10.3 10*3/uL (ref 4.0–10.5)
nRBC: 0 % (ref 0.0–0.2)

## 2019-05-27 LAB — SARS CORONAVIRUS 2 (TAT 6-24 HRS): SARS Coronavirus 2: NEGATIVE

## 2019-05-27 LAB — CREATININE, SERUM
Creatinine, Ser: 1.32 mg/dL — ABNORMAL HIGH (ref 0.44–1.00)
GFR calc Af Amer: 48 mL/min — ABNORMAL LOW (ref >60)
GFR calc non Af Amer: 42 mL/min — ABNORMAL LOW (ref >60)

## 2019-05-27 LAB — GLUCOSE, CAPILLARY
Glucose-Capillary: 88 mg/dL (ref 70–99)
Glucose-Capillary: 128 mg/dL — ABNORMAL HIGH (ref 70–99)

## 2019-05-27 LAB — TROPONIN I (HIGH SENSITIVITY): Troponin I (High Sensitivity): 20 ng/L — ABNORMAL HIGH (ref <18)

## 2019-05-27 MED ORDER — ALBUTEROL SULFATE HFA 108 (90 BASE) MCG/ACT IN AERS
2.0000 | INHALATION_SPRAY | Freq: Four times a day (QID) | RESPIRATORY_TRACT | Status: DC | PRN
  Filled 2019-05-27: qty 6.7

## 2019-05-27 MED ORDER — ATORVASTATIN CALCIUM 20 MG PO TABS
80.0000 mg | ORAL_TABLET | Freq: Every day | ORAL | Status: DC
  Administered 2019-05-28: 09:00:00 80 mg via ORAL
  Filled 2019-05-27: qty 4

## 2019-05-27 MED ORDER — ONDANSETRON HCL 4 MG/2ML IJ SOLN
4.0000 mg | Freq: Once | INTRAMUSCULAR | Status: AC
  Administered 2019-05-27: 4 mg via INTRAVENOUS
  Filled 2019-05-27: qty 2

## 2019-05-27 MED ORDER — LISINOPRIL-HYDROCHLOROTHIAZIDE 20-25 MG PO TABS: 1.0000 | ORAL_TABLET | Freq: Every day | ORAL | Status: DC

## 2019-05-27 MED ORDER — BUSPIRONE HCL 15 MG PO TABS
15.0000 mg | ORAL_TABLET | Freq: Three times a day (TID) | ORAL | Status: DC
  Administered 2019-05-28 (×2): 15 mg via ORAL
  Filled 2019-05-27 (×4): qty 1

## 2019-05-27 MED ORDER — INSULIN ASPART 100 UNIT/ML ~~LOC~~ SOLN
0.0000 [IU] | Freq: Three times a day (TID) | SUBCUTANEOUS | Status: DC
  Administered 2019-05-27 – 2019-05-28 (×2): 1 [IU] via SUBCUTANEOUS
  Filled 2019-05-27 (×2): qty 1

## 2019-05-27 MED ORDER — ASPIRIN 81 MG PO CHEW
81.0000 mg | CHEWABLE_TABLET | Freq: Every day | ORAL | Status: DC
  Administered 2019-05-28: 09:00:00 81 mg via ORAL
  Filled 2019-05-27: qty 1

## 2019-05-27 MED ORDER — GABAPENTIN 600 MG PO TABS: 600.0000 mg | ORAL_TABLET | Freq: Three times a day (TID) | ORAL | Status: DC

## 2019-05-27 MED ORDER — LACTATED RINGERS IV SOLN
INTRAVENOUS | Status: AC
  Administered 2019-05-27 – 2019-05-28 (×2): via INTRAVENOUS

## 2019-05-27 MED ORDER — ZIPRASIDONE HCL 20 MG PO CAPS
20.0000 mg | ORAL_CAPSULE | Freq: Two times a day (BID) | ORAL | Status: DC
  Administered 2019-05-28: 09:00:00 20 mg via ORAL
  Filled 2019-05-27 (×3): qty 1

## 2019-05-27 MED ORDER — FENTANYL CITRATE (PF) 100 MCG/2ML IJ SOLN
50.0000 ug | Freq: Once | INTRAMUSCULAR | Status: AC
  Administered 2019-05-27: 50 ug via INTRAVENOUS
  Filled 2019-05-27: qty 2

## 2019-05-27 MED ORDER — QUETIAPINE FUMARATE 25 MG PO TABS
25.0000 mg | ORAL_TABLET | Freq: Three times a day (TID) | ORAL | Status: DC
  Administered 2019-05-27 – 2019-05-28 (×3): 25 mg via ORAL
  Filled 2019-05-27 (×3): qty 1

## 2019-05-27 MED ORDER — DOCUSATE SODIUM 100 MG PO CAPS
100.0000 mg | ORAL_CAPSULE | Freq: Every day | ORAL | Status: DC
  Administered 2019-05-28: 100 mg via ORAL
  Filled 2019-05-27: qty 1

## 2019-05-27 MED ORDER — METOPROLOL SUCCINATE ER 25 MG PO TB24: 50.0000 mg | ORAL_TABLET | Freq: Every day | ORAL | Status: DC

## 2019-05-27 MED ORDER — HYDRALAZINE HCL 20 MG/ML IJ SOLN
10.0000 mg | INTRAMUSCULAR | Status: DC | PRN
  Filled 2019-05-27: qty 0.5

## 2019-05-27 MED ORDER — SODIUM CHLORIDE 0.9 % IV BOLUS
1000.0000 mL | Freq: Once | INTRAVENOUS | Status: AC
  Administered 2019-05-27: 1000 mL via INTRAVENOUS

## 2019-05-27 MED ORDER — TRAZODONE HCL 50 MG PO TABS
50.0000 mg | ORAL_TABLET | Freq: Every day | ORAL | Status: DC
  Filled 2019-05-27: qty 1

## 2019-05-27 MED ORDER — CLOPIDOGREL BISULFATE 75 MG PO TABS
75.0000 mg | ORAL_TABLET | Freq: Every day | ORAL | Status: DC
  Administered 2019-05-28: 75 mg via ORAL
  Filled 2019-05-27: qty 1

## 2019-05-27 MED ORDER — ENOXAPARIN SODIUM 40 MG/0.4ML ~~LOC~~ SOLN
40.0000 mg | SUBCUTANEOUS | Status: DC
  Administered 2019-05-27: 40 mg via SUBCUTANEOUS
  Filled 2019-05-27: qty 0.4

## 2019-05-27 MED ORDER — ACETAMINOPHEN 325 MG PO TABS: 650.0000 mg | ORAL_TABLET | Freq: Four times a day (QID) | ORAL | Status: DC | PRN

## 2019-05-27 MED ORDER — PANTOPRAZOLE SODIUM 40 MG PO TBEC
40.0000 mg | DELAYED_RELEASE_TABLET | Freq: Two times a day (BID) | ORAL | Status: DC
  Administered 2019-05-27 – 2019-05-28 (×2): 40 mg via ORAL
  Filled 2019-05-27 (×2): qty 1

## 2019-05-27 MED ORDER — ENOXAPARIN SODIUM 30 MG/0.3ML ~~LOC~~ SOLN
30.0000 mg | SUBCUTANEOUS | Status: DC
  Filled 2019-05-27: qty 0.3

## 2019-05-27 MED ORDER — ONDANSETRON HCL 4 MG/2ML IJ SOLN: 4.0000 mg | Freq: Four times a day (QID) | INTRAMUSCULAR | Status: DC | PRN

## 2019-05-27 MED ORDER — DULOXETINE HCL 30 MG PO CPEP
60.0000 mg | ORAL_CAPSULE | Freq: Every day | ORAL | Status: DC
  Administered 2019-05-28: 60 mg via ORAL
  Filled 2019-05-27: qty 2

## 2019-05-27 MED ORDER — FERROUS SULFATE 325 (65 FE) MG PO TABS
325.0000 mg | ORAL_TABLET | Freq: Every day | ORAL | Status: DC
  Administered 2019-05-28: 09:00:00 325 mg via ORAL
  Filled 2019-05-27 (×2): qty 1

## 2019-05-27 MED ORDER — ONDANSETRON HCL 4 MG PO TABS
4.0000 mg | ORAL_TABLET | Freq: Four times a day (QID) | ORAL | Status: DC | PRN
  Filled 2019-05-27: qty 1

## 2019-05-27 MED ORDER — ACETAMINOPHEN 650 MG RE SUPP
650.0000 mg | Freq: Four times a day (QID) | RECTAL | Status: DC | PRN
  Filled 2019-05-27: qty 1

## 2019-05-27 MED ORDER — FLUTICASONE FUROATE-VILANTEROL 100-25 MCG/INH IN AEPB
1.0000 | INHALATION_SPRAY | Freq: Every day | RESPIRATORY_TRACT | Status: DC
  Filled 2019-05-27 (×2): qty 28

## 2019-05-27 NOTE — ED Notes (Signed)
Pt given ascom phone to notify family

## 2019-05-27 NOTE — ED Notes (Signed)
Pt given water and applesauce per pt request and order.

## 2019-05-27 NOTE — ED Notes (Signed)
Pt had episode of bowel incontinents, assisted to toilet at this time

## 2019-05-27 NOTE — ED Notes (Signed)
Glucose 88 reported to [employer].

## 2019-05-27 NOTE — ED Notes (Signed)
Pt transferred to room 31, NAD noted. VSS. Pt a&ox4 and asking for tv remote at this time. Will continue to monitor.

## 2019-05-27 NOTE — Progress Notes (Signed)
PHARMACIST - PHYSICIAN COMMUNICATION  CONCERNING:  Enoxaparin (Lovenox) for DVT Prophylaxis    RECOMMENDATION: Patient was prescribed enoxaprin 40mg  q24 hours for VTE prophylaxis.   Filed Weights   05/26/19 2149  Weight: 160 lb (72.6 kg)    Body mass index is 30.23 kg/m.  Estimated Creatinine Clearance: 24.7 mL/min (A) (by C-G formula based on SCr of 2.01 mg/dL (H)).   Patient is candidate for enoxaparin 30mg  every 24 hours based on CrCl <23ml/min or Weight less then 45kg   DESCRIPTION: Pharmacy has adjusted enoxaparin dose per [facility] policy.  Patient is now receiving enoxaparin 30mg  every 24 hours.  Rowland Lathe, PharmD Clinical Pharmacist  05/27/2019 7:02 AM

## 2019-05-27 NOTE — ED Notes (Signed)
Patient currently refusing Covid swab.

## 2019-05-27 NOTE — ED Provider Notes (Signed)
[facility] Emergency Department Provider Note  ____________________________________________  Time seen: Approximately 2:27 AM  I have reviewed the triage vital signs and the nursing notes.   HISTORY  Chief Complaint Abdominal Pain and Emesis   HPI Tammy Armstrong is a 68 y.o. female with a history of diabetes, COPD, hepatitis C, hypertension, seizure disorder who presents for evaluation of abdominal pain and vomiting.  Patient reports 2 days of cramping upper abdominal pain associated with several episodes of nonbloody nonbilious emesis.  She has had diarrhea however that is chronic for her.  Had 1 bowel movement today.  She reports a fever of 101F at home.  She denies chest pain or shortness of breath but also reports a productive cough for the last few days.  No dysuria or hematuria or frequency.  She has been taking Tylenol at home for her pain unsuccessfully.  Patient has had several prior abdominal surgeries including cholecystectomy, appendectomy and hysterectomy.   Patient reports a syncopal event 2 days ago.  She she was doing laundry when she felt dizzy and passed out.  She hit the left side of her head onto the floor.  She denies headache or neck pain, no back pain.  Patient reports history of frequent syncopal events.  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   Respiratory failure ([place]) 07/04/2011   Seizure ([place]) 07/02/2011   Shortness of breath dyspnea    Stroke [facility])     Patient Active Problem List   Diagnosis Date Noted   PNA  (pneumonia) 10/12/2018   Tobacco use disorder 07/13/2016   Carotid aneurysm, 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   Right upper extremity numbness    TIA (transient ischemic attack) 06/27/2016   Carotid stenosis 04/08/2015   Migraine 04/06/2015   Dementia with behavioral disturbance ([place]) 01/19/2015   Diabetes ([place]) 07/04/2011   Anxiety 07/04/2011   COPD (chronic obstructive pulmonary disease) ([place]) 07/04/2011   Purulent bronchitis ([place]) 07/04/2011   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 Authorizing Provider  acetaminophen (TYLENOL) 500 MG tablet Take 1,000 mg by mouth every 8 (eight) hours as needed for pain. 04/25/18   [provider]  albuterol (PROVENTIL HFA;VENTOLIN HFA) 108 (90 BASE) MCG/ACT inhaler Inhale 2 puffs into the lungs every 6 (six) hours as needed for wheezing or shortness of breath.     [provider]  ASPIRIN LOW DOSE 81 MG EC tablet TAKE ONE TABLET BY MOUTH EVERY DAY. *DO NOT CRUSH* Patient taking differently: Take 81 mg by mouth daily.  03/08/17   Dew,  Erskine Squibb, MD  atorvastatin (LIPITOR) 80 MG tablet Take 80 mg by mouth daily.    [provider]  budesonide-formoterol (SYMBICORT) 80-4.5 MCG/ACT inhaler Inhale 2 puffs into the lungs 2 (two) times daily. 04/08/15   Gladstone Lighter, MD  busPIRone (BUSPAR) 15 MG tablet Take 15 mg by mouth 2 (two) times daily. 07/23/16   [provider]  clopidogrel (PLAVIX) 75 MG tablet TAKE ONE TABLET BY MOUTH EVERY DAY WITH BREAKFAST Patient taking differently: Take 75 mg by mouth daily.   11/13/16   Algernon Huxley, MD  diclofenac sodium (VOLTAREN) 1 % GEL Apply 2 g topically 4 (four) times daily. 01/10/17   [provider]  docusate sodium (COLACE) 100 MG capsule Take 100 mg by mouth daily.     [provider]  DULoxetine (CYMBALTA) 60 MG capsule Take 60 mg by mouth daily.     [provider]  ferrous sulfate (FERROUSUL) 325 (65 FE) MG tablet Take 325 mg by mouth daily with breakfast. 05/11/19 05/10/20  [provider]  fluticasone (FLONASE) 50 MCG/ACT nasal spray Place 2 sprays into both nostrils daily.    [provider]  fluticasone furoate-vilanterol (BREO ELLIPTA) 100-25 MCG/INH AEPB Inhale 1 puff into the lungs daily.    [provider]  gabapentin (NEURONTIN) 600 MG tablet Take 600 mg by mouth 3 (three) times daily. May take an extra dose at night if needed 05/09/18   [provider]  glipiZIDE (GLUCOTROL) 5 MG tablet Take 5 mg by mouth 2 (two) times daily before a meal.    [provider]  lisinopril-hydrochlorothiazide (ZESTORETIC) 20-25 MG tablet Take 1 tablet by mouth daily. 04/18/19   [provider]  meloxicam (MOBIC) 15 MG tablet Take 15 mg by mouth daily.     [provider]  metoprolol (TOPROL-XL) 50 MG 24 hr tablet Take 50 mg by mouth daily.      [provider]  pantoprazole (PROTONIX) 40 MG tablet Take 40 mg by mouth 2 (two) times daily.    [provider]  QUEtiapine (SEROQUEL) 25 MG tablet Take 25 mg by mouth 3 (three) times daily.     [provider]  traZODone (DESYREL) 50 MG tablet Take 50 mg by mouth at bedtime. 04/18/19   [provider]  ziprasidone (GEODON) 20 MG capsule Take 20 mg by mouth 2 (two) times daily. 08/23/18   [provider]    Allergies Promethazine, Sumatriptan, Ciprofloxacin, Doxycycline, Imitrex [sumatriptan base], Morphine and related, Penicillins, Ranitidine hcl, Zantac [ranitidine hcl], and Ketorolac  tromethamine  Family History  Problem Relation Age of Onset   Hypertension Mother    Heart attack Mother    Diabetes Mother    Anxiety disorder Mother    Stroke Mother    Diabetes Father    Diabetes Brother    Alcohol abuse Other        siblings   Hypertension Brother    Breast cancer Neg Hx     Social History Social History   Tobacco Use   Smoking status: Current Every Day Smoker    Packs/day: 1.00    Years: 52.00    Pack years: 52.00    Types: Cigarettes   Smokeless tobacco: Never Used  Substance Use Topics   Alcohol use: No   Drug use: No    Review of Systems  Constitutional: + fever. + syncope Eyes: Negative for visual changes. ENT: Negative for sore throat. Neck: No neck pain  Cardiovascular:  Negative for chest pain. Respiratory: Negative for shortness of breath. + cough Gastrointestinal: + abdominal pain, vomiting and diarrhea. Genitourinary: Negative for dysuria. Musculoskeletal: Negative for back pain. Skin: Negative for rash. Neurological: Negative for headaches, weakness or numbness. Psych: No SI or HI  ____________________________________________   PHYSICAL EXAM:  VITAL SIGNS: ED Triage Vitals  Enc Vitals Group     BP 05/26/19 2149 (!) 153/73     Pulse Rate 05/26/19 2149 80     Resp 05/26/19 2149 18     Temp 05/26/19 2149 99.4 F (37.4 C)     Temp Source 05/26/19 2149 Oral     SpO2 05/26/19 2149 99 %     Weight 05/26/19 2149 160 lb (72.6 kg)     Height 05/26/19 2149 5\' 1"  (1.549 m)     Head Circumference --      Peak Flow --      Pain Score 05/26/19 2156 7     Pain Loc --      Pain Edu? --      Excl. in [place]? --     Constitutional: Alert and oriented. Well appearing and in no apparent distress. HEENT:      Head: Normocephalic.  Left-sided parietal hematoma      Eyes: Conjunctivae are normal. Sclera is non-icteric.       Mouth/Throat: Mucous membranes are moist.       Neck: Supple with no signs of meningismus.  No  midline C-spine tenderness Cardiovascular: Regular rate and rhythm. No murmurs, gallops, or rubs. 2+ symmetrical distal pulses are present in all extremities. No JVD. Respiratory: Normal respiratory effort. Lungs are clear to auscultation bilaterally. No wheezes, crackles, or rhonchi.  Gastrointestinal: Abdomen is soft with diffuse tenderness to palpation, no rebound or guarding, decreased bowel sounds.   Genitourinary: No CVA tenderness. Musculoskeletal: Nontender with normal range of motion in all extremities. No edema, cyanosis, or erythema of extremities.  No midline T and L-spine tenderness. Neurologic: Normal speech and language. Face is symmetric. Moving all extremities. No gross focal neurologic deficits are appreciated. Skin: Skin is warm, dry and intact. No rash noted. Psychiatric: Mood and affect are normal. Speech and behavior are normal.  ____________________________________________   LABS (all labs ordered are listed, but only abnormal results are displayed)  Labs Reviewed  BASIC METABOLIC PANEL - Abnormal; Notable for the following components:      Result Value   CO2 21 (*)    Glucose, Bld 205 (*)    BUN 31 (*)    Creatinine, Ser 2.01 (*)    GFR calc non Af Amer 25 (*)    GFR calc Af Amer 29 (*)    All other components within normal limits  CBC - Abnormal; Notable for the following components:   WBC 14.6 (*)    All other components within normal limits  URINALYSIS, COMPLETE (UACMP) WITH MICROSCOPIC - Abnormal; Notable for the following components:   Color, Urine YELLOW (*)    APPearance CLEAR (*)    Protein, ur 30 (*)    All other components within normal limits  TROPONIN I (HIGH SENSITIVITY) - Abnormal; Notable for the following components:   Troponin I (High Sensitivity) 21 (*)    All other components within normal limits  TROPONIN I (HIGH SENSITIVITY) - Abnormal; Notable for the following components:   Troponin I (High Sensitivity) 20 (*)    All other  components within normal limits  SARS CORONAVIRUS 2 (TAT 6-24 HRS)  URINE CULTURE  LIPASE, BLOOD  HEPATIC FUNCTION PANEL   ____________________________________________  EKG  ED ECG REPORT I, Rudene Re, the attending physician, personally viewed and interpreted this ECG.  Normal sinus rhythm, rate of 85, normal intervals, normal axis, no ST elevations or depressions, T wave inversions in 1 and aVL.  EKG is unchanged from prior. ____________________________________________  RADIOLOGY  I have personally reviewed the images performed during this visit and I agree with the Radiologist's read.   Interpretation by Radiologist:  Ct Abdomen Pelvis Wo Contrast  Result Date: 05/27/2019 CLINICAL DATA:  Abdominal pain, vomiting EXAM: CT ABDOMEN AND PELVIS WITHOUT CONTRAST TECHNIQUE: Multidetector CT imaging of the abdomen and pelvis was performed following the standard protocol without IV contrast. COMPARISON:  10/10/2018 FINDINGS: Lower chest: Lung bases are clear. No effusions. Heart is normal size. Hepatobiliary: No focal liver abnormality is seen. Status post cholecystectomy. No biliary dilatation. Pancreas: No focal abnormality or ductal dilatation. Spleen: No focal abnormality.  Normal size. Adrenals/Urinary Tract: No adrenal abnormality. No focal renal abnormality. No stones or hydronephrosis. Urinary bladder is unremarkable. Stomach/Bowel: Stomach, large and small bowel grossly unremarkable. Vascular/Lymphatic: Aortic atherosclerosis. No enlarged abdominal or pelvic lymph nodes. Reproductive: Prior hysterectomy.  No adnexal masses. Other: No free fluid or free air. Musculoskeletal: No acute bony abnormality. IMPRESSION: No acute findings in the abdomen or pelvis. Aortic atherosclerosis. Electronically Signed   By: Rolm Baptise M.D.   On: 05/27/2019 02:56   Dg Chest 2 View  Result Date: 05/26/2019 CLINICAL DATA:  Abdominal pain, vomiting, syncope EXAM: CHEST - 2 VIEW COMPARISON:   10/20/2018 FINDINGS: Heart and mediastinal contours are within normal limits. No focal opacities or effusions. No acute bony abnormality. IMPRESSION: No active cardiopulmonary disease. Electronically Signed   By: Rolm Baptise M.D.   On: 05/26/2019 22:27   Ct Head Wo Contrast  Result Date: 05/26/2019 CLINICAL DATA:  Syncope, head trauma, ataxia. EXAM: CT HEAD WITHOUT CONTRAST TECHNIQUE: Contiguous axial images were obtained from the base of the skull through the vertex without intravenous contrast. COMPARISON:  06/27/2016 FINDINGS: Brain: Calcifications centrally within the pons is again noted and stable since prior study. 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 There is atrophy and chronic small vessel disease changes. No acute intracranial abnormality. Specifically, no hemorrhage, hydrocephalus, mass lesion, acute infarction, or significant intracranial injury. Vascular: No hyperdense vessel or unexpected calcification. Skull: No acute calvarial abnormality. Sinuses/Orbits: Visualized paranasal sinuses and mastoids clear. Orbital soft tissues unremarkable. Other: Soft tissue swelling in the left posterior parietal region. IMPRESSION: No acute intracranial abnormality. Electronically Signed   By: Rolm Baptise M.D.   On: 05/26/2019 22:39     ____________________________________________   PROCEDURES  Procedure(s) performed: None Procedures Critical Care performed:  None ____________________________________________   INITIAL IMPRESSION / ASSESSMENT AND PLAN / ED COURSE   68 y.o. female with a history of diabetes, COPD, hepatitis C, hypertension, seizure disorder who presents for evaluation of abdominal pain and vomiting.  Patient has normal vital signs, afebrile, abdomen is soft with decrease bowel sounds, no distention, diffuse tenderness with no rebound or guarding.  Normal work of breathing, normal sats.  # abdominal pain: ddx small bowel obstruction versus gastritis  versus peptic ulcer disease versus gastroenteritis versus pancreatitis versus colitis versus pyelonephritis.  Plan for CT.  Will give IV fluids, Zofran and fentanyl.  Labs showing acute kidney injury and leukocytosis.  Also possible Covid with fever and cough.  # cough: CXR negative for PNA. Covid pending. Normal WOB and  normal sats  # syncope: Patient with a syncopal event 2 days ago and head trauma.  She has a scalp hematoma with no laceration.  Head CT is negative for intracranial injury.  No other injuries from her syncopal event.  EKG shows no acute ischemic changes or dysrhythmias.  Possibly dehydration from vomiting.  Patient is monitor on telemetry.  Anticipate admission.    _________________________ 3:51 AM on 05/27/2019 -----------------------------------------  CT negative for acute pathology. UA negative for UTI. Labs showing AKI. Will admit for IV hydration and syncope work up.     As part of my medical decision making, I reviewed the following data within the [place] notes reviewed and incorporated, Labs reviewed , EKG interpreted , Old EKG reviewed, Old chart reviewed, Radiograph reviewed , Discussed with admitting physician , Notes from prior ED visits and [place] Controlled Substance Database   Patient was evaluated in Emergency Department today for the symptoms described in the history of present illness. Patient was evaluated in the context of the global COVID-19 pandemic, which necessitated consideration that the patient might be at risk for infection with the SARS-CoV-2 virus that causes COVID-19. Institutional protocols and algorithms that pertain to the evaluation of patients at risk for COVID-19 are in a state of rapid change based on information released by regulatory bodies including the CDC and federal and state organizations. These policies and algorithms were followed during the patient's care in the  ED.   ____________________________________________   FINAL CLINICAL IMPRESSION(S) / ED DIAGNOSES   Final diagnoses:  AKI (acute kidney injury) ([place])  Nausea and vomiting, intractability of vomiting not specified, unspecified vomiting type  Pain of upper abdomen  Syncope, unspecified syncope type      NEW MEDICATIONS STARTED DURING THIS VISIT:  ED Discharge Orders    None       Note:  This document was prepared using Dragon voice recognition software and may include unintentional dictation errors.    Rudene Re, MD 05/27/19 [phone]

## 2019-05-27 NOTE — ED Notes (Signed)
Pt assisted to toilet at this time 

## 2019-05-27 NOTE — ED Notes (Signed)
Pt's door was mostly closed so patient began screaming. This nurse went into the room to see what the patient needed and she kept yelling saying not to close the door. This nurse told the patient that she cannot talk to staff in that way and that we are here to take care of her. Pt states "I can talk to you the way I want bitch" and continues yelling at this nurse.

## 2019-05-27 NOTE — ED Notes (Signed)
Pt assisted from bed to toilet. Pt urinated and had BM. Pt assisted back to bed, placed back on cardiac monitoring and urged to use call light for any further needs. Pt has no needs at this time.

## 2019-05-27 NOTE — ED Notes (Signed)
Patient transported to CT 

## 2019-05-27 NOTE — ED Notes (Signed)
Assisted pt to the restroom. Pt is back in bed resting,with call bell by her side.

## 2019-05-27 NOTE — H&P (Signed)
History and Physical    Tammy Armstrong [id number] DOB: Feb 05, 1951 DOA: 05/27/2019  PCP: Gayland Curry, MD  Patient coming from: Home.  Chief Complaint: Loss of consciousness and nausea vomiting.  HPI: Tammy Armstrong is a 68 y.o. female with history of COPD, diabetes mellitus, depression, hypertension presents to the ER because of intractable nausea vomiting ongoing for last 3 days.  Denies any associated diarrhea.  Denies any fever chills or chest pain or productive cough.  Patient states she had a brief episode of loss of consciousness yesterday when she was trying to collect her close from the machine.  She did hit her head.  There was a small laceration on occipital area.  Patient states she was unable to keep much food inside because of vomiting.  ED Course: In the ER CT head and CT abdomen was done which was unremarkable UA was unremarkable EKG shows normal sinus rhythm with QTC of 447 ms.  Patient's creatinine has increased from baseline of around 0.83 in June 2020.  WBC count is 14.6 platelets 319 glucose 205.  Urine largely unremarkable.  Patient was given 1 L fluid bolus and admitted for acute renal failure with syncope intractable nausea vomiting.  Review of Systems: As per HPI, rest all negative.   Past Medical History:  Diagnosis Date  . Anxiety   . Arthritis   . Carotid artery occlusion   . Cigarette smoker one half pack a day or less   . COPD (chronic obstructive pulmonary disease) ([place]) 07/04/2011  . Depression   . Diabetes mellitus   . Diabetes mellitus 07/04/2011  . GERD (gastroesophageal reflux disease)   . Hepatitis C   . Hypercholesterolemia   . Hypertension   . Migraines   . Pancreatitis   . [place] hemorrhage ([place]) 07/02/2011  . Respiratory failure ([place]) 07/04/2011  . Seizure ([place]) 07/02/2011  . Shortness of breath dyspnea   . Stroke [facility])     Past Surgical History:  Procedure Laterality Date  . ABDOMINAL HYSTERECTOMY    . APPENDECTOMY    . BACK  SURGERY  2012   cyst removed from spine , [place]  . CHOLECYSTECTOMY    . ENDARTERECTOMY Left 05/18/2015   Procedure: ENDARTERECTOMY CAROTID;  Surgeon: Algernon Huxley, MD;  Location: [facility] ORS;  Service: Vascular;  Laterality: Left;     reports that she has been smoking cigarettes. She has a 52.00 pack-year smoking history. She has never used smokeless tobacco. She reports that she does not drink alcohol or use drugs.  Allergies  Allergen Reactions  . Promethazine Other (See Comments)    studdering Other Reaction: IMPAIRS SPEECH, UNABLE TO VERB studdering Other Reaction: IMPAIRS SPEECH, UNABLE TO VERB   . Sumatriptan     Other reaction(s): Other (See Comments) Other Reaction: PANIC ATTACK  . Ciprofloxacin Other (See Comments)    Medication is contraindicated with some of pts other meds.   Delirium Delirium   . Doxycycline Nausea And Vomiting  . Imitrex [Sumatriptan Base] Other (See Comments)    Pt states that it "puts her out of this world".    . Morphine And Related Nausea And Vomiting  . Penicillins Nausea And Vomiting and Other (See Comments)    Pt is unable to answer additional questions about this medication.    . Ranitidine Hcl Other (See Comments)    studdering studdering unknown   . Zantac [Ranitidine Hcl] Other (See Comments)    Reaction:  Unknown   . Ketorolac Tromethamine  Rash    Family History  Problem Relation Age of Onset  . Hypertension Mother   . Heart attack Mother   . Diabetes Mother   . Anxiety disorder Mother   . Stroke Mother   . Diabetes Father   . Diabetes Brother   . Alcohol abuse Other        siblings  . Hypertension Brother   . Breast cancer Neg Hx     Prior to Admission medications   Medication Sig Start Date End Date Taking? Authorizing Provider  acetaminophen (TYLENOL) 500 MG tablet Take 1,000 mg by mouth every 8 (eight) hours as needed for pain. 04/25/18   [provider]  albuterol (PROVENTIL HFA;VENTOLIN HFA) 108 (90  BASE) MCG/ACT inhaler Inhale 2 puffs into the lungs every 6 (six) hours as needed for wheezing or shortness of breath.     [provider]  ASPIRIN LOW DOSE 81 MG EC tablet TAKE ONE TABLET BY MOUTH EVERY DAY. *DO NOT CRUSH* Patient taking differently: Take 81 mg by mouth daily.  03/08/17   Algernon Huxley, MD  atorvastatin (LIPITOR) 80 MG tablet Take 80 mg by mouth daily.    [provider]  budesonide-formoterol (SYMBICORT) 80-4.5 MCG/ACT inhaler Inhale 2 puffs into the lungs 2 (two) times daily. 04/08/15   Gladstone Lighter, MD  busPIRone (BUSPAR) 15 MG tablet Take 15 mg by mouth 2 (two) times daily. 07/23/16   [provider]  clopidogrel (PLAVIX) 75 MG tablet TAKE ONE TABLET BY MOUTH EVERY DAY WITH BREAKFAST Patient taking differently: Take 75 mg by mouth daily.  11/13/16   Algernon Huxley, MD  diclofenac sodium (VOLTAREN) 1 % GEL Apply 2 g topically 4 (four) times daily. 01/10/17   [provider]  docusate sodium (COLACE) 100 MG capsule Take 100 mg by mouth daily.     [provider]  DULoxetine (CYMBALTA) 60 MG capsule Take 60 mg by mouth daily.     [provider]  ferrous sulfate (FERROUSUL) 325 (65 FE) MG tablet Take 325 mg by mouth daily with breakfast. 05/11/19 05/10/20  [provider]  fluticasone (FLONASE) 50 MCG/ACT nasal spray Place 2 sprays into both nostrils daily.    [provider]  fluticasone furoate-vilanterol (BREO ELLIPTA) 100-25 MCG/INH AEPB Inhale 1 puff into the lungs daily.    [provider]  gabapentin (NEURONTIN) 600 MG tablet Take 600 mg by mouth 3 (three) times daily. May take an extra dose at night if needed 05/09/18   [provider]  glipiZIDE (GLUCOTROL) 5 MG tablet Take 5 mg by mouth 2 (two) times daily before a meal.    [provider]  lisinopril-hydrochlorothiazide (ZESTORETIC) 20-25 MG tablet Take 1 tablet by mouth daily. 04/18/19   [provider]  meloxicam  (MOBIC) 15 MG tablet Take 15 mg by mouth daily.     [provider]  metoprolol (TOPROL-XL) 50 MG 24 hr tablet Take 50 mg by mouth daily.      [provider]  pantoprazole (PROTONIX) 40 MG tablet Take 40 mg by mouth 2 (two) times daily.    [provider]  QUEtiapine (SEROQUEL) 25 MG tablet Take 25 mg by mouth 3 (three) times daily.     [provider]  traZODone (DESYREL) 50 MG tablet Take 50 mg by mouth at bedtime. 04/18/19   [provider]  ziprasidone (GEODON) 20 MG capsule Take 20 mg by mouth 2 (two) times daily. 08/23/18   [provider]    Physical Exam: Constitutional: Moderately built and nourished. Vitals:   05/27/19 0500 05/27/19 0530 05/27/19 0600 05/27/19 0630  BP: (!) 166/60 120/62 (!) 132/53 (!) 150/61  Pulse: 78 77 80 78  Resp: (!) 24 (!) 23 20 20   Temp:      TempSrc:      SpO2: 99% 96% 96% 96%  Weight:      Height:       Eyes: Anicteric no pallor. ENMT: No discharge from the ears eyes nose or mouth. Neck: No mass felt.  No neck rigidity.  No JVD appreciated. Respiratory: No rhonchi or crepitations. Cardiovascular: S1-S2 heard. Abdomen: Soft nontender bowel sounds present. Musculoskeletal: No edema. Skin: Small hematoma on the occipital area. Neurologic: Alert awake oriented to time place and person.  Moves all extremities. Psychiatric: Appears normal.   Labs on Admission: I have personally reviewed following labs and imaging studies  CBC: Recent Labs  Lab 05/26/19 2150  WBC 14.6*  HGB 12.9  HCT 39.3  MCV 96.3  PLT 99991111   Basic Metabolic Panel: Recent Labs  Lab 05/26/19 2150  NA 136  K 4.0  CL 102  CO2 21*  GLUCOSE 205*  BUN 31*  CREATININE 2.01*  CALCIUM 9.1   GFR: Estimated Creatinine Clearance: 24.7 mL/min (A) (by C-G formula based on SCr of 2.01 mg/dL (H)). Liver Function Tests: Recent Labs  Lab 05/27/19 0022  AST 36  ALT 12  ALKPHOS 53  BILITOT 0.6  PROT 7.2  ALBUMIN 3.8    Recent Labs  Lab 05/26/19 2150  LIPASE 44   No results for input(s): AMMONIA in the last 168 hours. Coagulation Profile: No results for input(s): INR, PROTIME in the last 168 hours. Cardiac Enzymes: No results for input(s): CKTOTAL, CKMB, CKMBINDEX, TROPONINI in the last 168 hours. BNP (last 3 results) No results for input(s): PROBNP in the last 8760 hours. HbA1C: No results for input(s): HGBA1C in the last 72 hours. CBG: No results for input(s): GLUCAP in the last 168 hours. Lipid Profile: No results for input(s): CHOL, HDL, LDLCALC, TRIG, CHOLHDL, LDLDIRECT in the last 72 hours. Thyroid Function Tests: No results for input(s): TSH, T4TOTAL, FREET4, T3FREE, THYROIDAB in the last 72 hours. Anemia Panel: No results for input(s): VITAMINB12, FOLATE, FERRITIN, TIBC, IRON, RETICCTPCT in the last 72 hours. Urine analysis:    Component Value Date/Time   COLORURINE YELLOW (A) 05/27/2019 0218   APPEARANCEUR CLEAR (A) 05/27/2019 0218   APPEARANCEUR Clear 07/10/2014 1749   LABSPEC 1.025 05/27/2019 0218   LABSPEC 1.025 07/10/2014 1749   PHURINE 5.0 05/27/2019 0218   GLUCOSEU NEGATIVE 05/27/2019 0218   GLUCOSEU Negative 07/10/2014 1749   HGBUR NEGATIVE 05/27/2019 0218   BILIRUBINUR NEGATIVE 05/27/2019 0218   BILIRUBINUR Negative 07/10/2014 [place] 05/27/2019 0218   PROTEINUR 30 (A) 05/27/2019 0218   UROBILINOGEN 1.0 07/02/2011 1005   NITRITE NEGATIVE 05/27/2019 0218   LEUKOCYTESUR NEGATIVE 05/27/2019 0218   LEUKOCYTESUR Negative 07/10/2014 1749   Sepsis Labs: @LABRCNTIP (procalcitonin:4,lacticidven:4) )No results found for this or any previous visit (from the past 240 hour(s)).   Radiological Exams on Admission: Ct Abdomen Pelvis Wo Contrast  Result Date: 05/27/2019 CLINICAL DATA:  Abdominal pain, vomiting EXAM: CT ABDOMEN AND PELVIS WITHOUT CONTRAST TECHNIQUE: Multidetector CT imaging of the abdomen and pelvis was performed following the standard protocol  without IV contrast. COMPARISON:  10/10/2018 FINDINGS: Lower chest: Lung bases are clear. No effusions. Heart is normal size. Hepatobiliary: No focal liver  abnormality is seen. Status post cholecystectomy. No biliary dilatation. Pancreas: No focal abnormality or ductal dilatation. Spleen: No focal abnormality.  Normal size. Adrenals/Urinary Tract: No adrenal abnormality. No focal renal abnormality. No stones or hydronephrosis. Urinary bladder is unremarkable. Stomach/Bowel: Stomach, large and small bowel grossly unremarkable. Vascular/Lymphatic: Aortic atherosclerosis. No enlarged abdominal or pelvic lymph nodes. Reproductive: Prior hysterectomy.  No adnexal masses. Other: No free fluid or free air. Musculoskeletal: No acute bony abnormality. IMPRESSION: No acute findings in the abdomen or pelvis. Aortic atherosclerosis. Electronically Signed   By: Rolm Baptise M.D.   On: 05/27/2019 02:56   Dg Chest 2 View  Result Date: 05/26/2019 CLINICAL DATA:  Abdominal pain, vomiting, syncope EXAM: CHEST - 2 VIEW COMPARISON:  10/20/2018 FINDINGS: Heart and mediastinal contours are within normal limits. No focal opacities or effusions. No acute bony abnormality. IMPRESSION: No active cardiopulmonary disease. Electronically Signed   By: Rolm Baptise M.D.   On: 05/26/2019 22:27   Ct Head Wo Contrast  Result Date: 05/26/2019 CLINICAL DATA:  Syncope, head trauma, ataxia. EXAM: CT HEAD WITHOUT CONTRAST TECHNIQUE: Contiguous axial images were obtained from the base of the skull through the vertex without intravenous contrast. COMPARISON:  06/27/2016 FINDINGS: Brain: Calcifications centrally within the pons is again noted and stable since prior study. 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 There is atrophy and chronic small vessel disease changes. No acute intracranial abnormality. Specifically, no hemorrhage, hydrocephalus, mass lesion, acute infarction, or significant intracranial injury. Vascular: No  hyperdense vessel or unexpected calcification. Skull: No acute calvarial abnormality. Sinuses/Orbits: Visualized paranasal sinuses and mastoids clear. Orbital soft tissues unremarkable. Other: Soft tissue swelling in the left posterior parietal region. IMPRESSION: No acute intracranial abnormality. Electronically Signed   By: Rolm Baptise M.D.   On: 05/26/2019 22:39    EKG: Independently reviewed.  Normal sinus rhythm.  QTC 447 ms.  Assessment/Plan Principal Problem:   Syncope Active Problems:   COPD (chronic obstructive pulmonary disease) (HCC)   Dementia with behavioral disturbance (HCC)   Nausea & vomiting   Essential hypertension    1. Syncope -cause not clear.  Will need to check orthostatics continue hydration.  Closely monitor in telemetry. 2. Acute renal failure likely from nausea vomiting.  Will hold ACE inhibitor hydrochlorothiazide continue hydration follow metabolic panel intake output. 3. Intractable nausea vomiting cause not clear.  CT abdomen unremarkable UA unremarkable.  Abdomen appears benign.  Will start diet and advance as tolerated.  Could be from gastroenteritis.  Had a similar episode in early part of this year. 4. Diabetes mellitus type 2 we will keep patient on sliding scale coverage. 5. Hypertension we will keep patient on as needed IV hydralazine continue metoprolol.  Holding lisinopril hydrochlorothiazide due to acute renal failure. 6. COPD not actively wheezing. 7. Depression on Seroquel.  We need to confirm with pharmacy if patient is taking Geodon Cymbalta and Neurontin which mention her medication list but patient does not recall taking them.  COVID-19 test is pending.   DVT prophylaxis: Lovenox. Code Status: Full code. Family Communication: Discussed with patient. Disposition Plan: Home. Consults called: None. Admission status: Observation.   Rise Patience MD [facility] Pager [phone].  If 7PM-7AM, please contact  night-coverage [email or web address] Password [email or web address]  05/27/2019, 6:40 AM

## 2019-05-27 NOTE — Progress Notes (Addendum)
[facility] [place] NAME: Tammy Armstrong    MR#:  [id number]  DATE OF BIRTH:  14-Oct-1950  SUBJECTIVE:   Feels better today. No N,V or diarrhea REVIEW OF SYSTEMS:   Review of Systems  Constitutional: Positive for malaise/fatigue. Negative for chills, fever and weight loss.  HENT: Negative for ear discharge, ear pain and nosebleeds.   Eyes: Negative for blurred vision, pain and discharge.  Respiratory: Negative for sputum production, shortness of breath, wheezing and stridor.   Cardiovascular: Negative for chest pain, palpitations, orthopnea and PND.  Gastrointestinal: Negative for abdominal pain, diarrhea, nausea and vomiting.  Genitourinary: Negative for frequency and urgency.  Musculoskeletal: Negative for back pain and joint pain.  Neurological: Positive for weakness. Negative for sensory change, speech change and focal weakness.  Psychiatric/Behavioral: Negative for depression and hallucinations. The patient is not nervous/anxious.    Tolerating Diet:yes Tolerating PT: pending  DRUG ALLERGIES:   Allergies  Allergen Reactions  . Promethazine Other (See Comments)    studdering Other Reaction: IMPAIRS SPEECH, UNABLE TO VERB studdering Other Reaction: IMPAIRS SPEECH, UNABLE TO VERB   . Sumatriptan     Other reaction(s): Other (See Comments) Other Reaction: PANIC ATTACK  . Ciprofloxacin Other (See Comments)    Medication is contraindicated with some of pts other meds.   Delirium Delirium   . Doxycycline Nausea And Vomiting  . Imitrex [Sumatriptan Base] Other (See Comments)    Pt states that it "puts her out of this world".    . Morphine And Related Nausea And Vomiting  . Penicillins Nausea And Vomiting and Other (See Comments)    Pt is unable to answer additional questions about this medication.    . Ranitidine Hcl Other (See Comments)    studdering studdering unknown   . Zantac [Ranitidine Hcl] Other (See Comments)     Reaction:  Unknown   . Ketorolac Tromethamine Rash    VITALS:  Blood pressure 127/61, pulse 73, temperature 99.4 F (37.4 C), temperature source Oral, resp. rate (!) 28, height 5\' 1"  (1.549 m), weight 72.6 kg, SpO2 98 %.  PHYSICAL EXAMINATION:   Physical Exam  GENERAL:  68 y.o.-year-old patient lying in the bed with no acute distress.  EYES: Pupils equal, round, reactive to light and accommodation. No scleral icterus. Extraocular muscles intact.  HEENT: Head atraumatic, normocephalic. Oropharynx and nasopharynx clear.  NECK:  Supple, no jugular venous distention. No thyroid enlargement, no tenderness.  LUNGS: Normal breath sounds bilaterally, no wheezing, rales, rhonchi. No use of accessory muscles of respiration.  CARDIOVASCULAR: S1, S2 normal. No murmurs, rubs, or gallops.  ABDOMEN: Soft, nontender, nondistended. Bowel sounds present. No organomegaly or mass.  EXTREMITIES: No cyanosis, clubbing or edema b/l.    NEUROLOGIC: Cranial nerves II through XII are intact. No focal Motor or sensory deficits b/l.   PSYCHIATRIC:  patient is alert and oriented x 3.  SKIN: No obvious rash, lesion, or ulcer.   LABORATORY PANEL:  CBC Recent Labs  Lab 05/26/19 2150  WBC 14.6*  HGB 12.9  HCT 39.3  PLT 319    Chemistries  Recent Labs  Lab 05/26/19 2150 05/27/19 0022  NA 136  --   K 4.0  --   CL 102  --   CO2 21*  --   GLUCOSE 205*  --   BUN 31*  --   CREATININE 2.01*  --   CALCIUM 9.1  --   AST  --  36  ALT  --  12  ALKPHOS  --  53  BILITOT  --  0.6   Cardiac Enzymes No results for input(s): TROPONINI in the last 168 hours. RADIOLOGY:  Ct Abdomen Pelvis Wo Contrast  Result Date: 05/27/2019 CLINICAL DATA:  Abdominal pain, vomiting EXAM: CT ABDOMEN AND PELVIS WITHOUT CONTRAST TECHNIQUE: Multidetector CT imaging of the abdomen and pelvis was performed following the standard protocol without IV contrast. COMPARISON:  10/10/2018 FINDINGS: Lower chest: Lung bases are clear. No  effusions. Heart is normal size. Hepatobiliary: No focal liver abnormality is seen. Status post cholecystectomy. No biliary dilatation. Pancreas: No focal abnormality or ductal dilatation. Spleen: No focal abnormality.  Normal size. Adrenals/Urinary Tract: No adrenal abnormality. No focal renal abnormality. No stones or hydronephrosis. Urinary bladder is unremarkable. Stomach/Bowel: Stomach, large and small bowel grossly unremarkable. Vascular/Lymphatic: Aortic atherosclerosis. No enlarged abdominal or pelvic lymph nodes. Reproductive: Prior hysterectomy.  No adnexal masses. Other: No free fluid or free air. Musculoskeletal: No acute bony abnormality. IMPRESSION: No acute findings in the abdomen or pelvis. Aortic atherosclerosis. Electronically Signed   By: Rolm Baptise M.D.   On: 05/27/2019 02:56   Dg Chest 2 View  Result Date: 05/26/2019 CLINICAL DATA:  Abdominal pain, vomiting, syncope EXAM: CHEST - 2 VIEW COMPARISON:  10/20/2018 FINDINGS: Heart and mediastinal contours are within normal limits. No focal opacities or effusions. No acute bony abnormality. IMPRESSION: No active cardiopulmonary disease. Electronically Signed   By: Rolm Baptise M.D.   On: 05/26/2019 22:27   Ct Head Wo Contrast  Result Date: 05/26/2019 CLINICAL DATA:  Syncope, head trauma, ataxia. EXAM: CT HEAD WITHOUT CONTRAST TECHNIQUE: Contiguous axial images were obtained from the base of the skull through the vertex without intravenous contrast. COMPARISON:  06/27/2016 FINDINGS: Brain: Calcifications centrally within the pons is again noted and stable since prior study. 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 There is atrophy and chronic small vessel disease changes. No acute intracranial abnormality. Specifically, no hemorrhage, hydrocephalus, mass lesion, acute infarction, or significant intracranial injury. Vascular: No hyperdense vessel or unexpected calcification. Skull: No acute calvarial abnormality.  Sinuses/Orbits: Visualized paranasal sinuses and mastoids clear. Orbital soft tissues unremarkable. Other: Soft tissue swelling in the left posterior parietal region. IMPRESSION: No acute intracranial abnormality. Electronically Signed   By: Rolm Baptise M.D.   On: 05/26/2019 22:39   ASSESSMENT AND PLAN:  Tammy Armstrong is a 68 y.o. female with a history of diabetes, COPD, hepatitis C, hypertension, seizure disorder who presents for evaluation of abdominal pain and vomiting.  Patient reports 2 days of cramping upper abdominal pain associated with several episodes of nonbloody nonbilious emesis.  She has had diarrhea however that is chronic for her.   1. Syncope due to orthostatic hypotension with Acute Renal fialure  due to GI losses and oral home meds precipitating it -feels better today -BP improving -holding BP meds and HCTZ  2. Acute renal failure likely from nausea vomiting. -  Will hold ACE inhibitor hydrochlorothiazide continue hydration follow metabolic panel intake output. -baseline creat 1.1 -avoid nephrotoxins  3. Intractable nausea vomiting cause not clear. -  CT abdomen unremarkable UA unremarkable.  Abdomen appears benign.  Will start diet and advance as tolerated.  Could be from gastroenteritis.  Had a similar episode in early part of this year.  4. Diabetes mellitus type 2 we will keep patient on sliding scale coverage.  5. Hypertension we will keep patient on as needed IV hydralazine continue metoprolol.  -  Holding lisinopril hydrochlorothiazide due to acute renal failure.  6. COPD not actively wheezing. Pt is chronic smoker--not motivated to quit.  7. Depression on Seroquel, cymbalta and geodon  Leave at home alone. No family around per Pt  CM for d/c planning  Case discussed with Care Management/Social Worker. Management plans discussed with the patient  CODE STATUS: full  DVT Prophylaxis: lovenox  TOTAL TIME TAKING CARE OF THIS PATIENT: *30* minutes.  >50%  time spent on counselling and coordination of care  POSSIBLE D/C IN *1-2* DAYS, DEPENDING ON CLINICAL CONDITION.  Note: This dictation was prepared with Dragon dictation along with smaller phrase technology. Any transcriptional errors that result from this process are unintentional.  Fritzi Mandes M.D on 05/27/2019 at 4:56 PM  Between 7am to 6pm - Pager - [phone]  After 6pm go to [email or web address] - password [email or web address] [facility]   CC: Primary care physician; Gayland Curry, MDPatient ID: Tammy Armstrong, female   DOB: Aug 24, 1950, 68 y.o.   MRN: [id number]

## 2019-05-28 DIAGNOSIS — R55 Syncope and collapse: Secondary | ICD-10-CM | POA: Diagnosis not present

## 2019-05-28 DIAGNOSIS — I10 Essential (primary) hypertension: Secondary | ICD-10-CM

## 2019-05-28 DIAGNOSIS — J449 Chronic obstructive pulmonary disease, unspecified: Secondary | ICD-10-CM | POA: Diagnosis not present

## 2019-05-28 DIAGNOSIS — N179 Acute kidney failure, unspecified: Secondary | ICD-10-CM | POA: Diagnosis not present

## 2019-05-28 LAB — CBC
HCT: 32.2 % — ABNORMAL LOW (ref 36.0–46.0)
Hemoglobin: 10.6 g/dL — ABNORMAL LOW (ref 12.0–15.0)
MCH: 30.9 pg (ref 26.0–34.0)
MCHC: 32.9 g/dL (ref 30.0–36.0)
MCV: 93.9 fL (ref 80.0–100.0)
Platelets: 269 10*3/uL (ref 150–400)
RBC: 3.43 MIL/uL — ABNORMAL LOW (ref 3.87–5.11)
RDW: 13.6 % (ref 11.5–15.5)
WBC: 9.4 10*3/uL (ref 4.0–10.5)
nRBC: 0 % (ref 0.0–0.2)

## 2019-05-28 LAB — URINE CULTURE: Culture: <10000 — AB

## 2019-05-28 LAB — BASIC METABOLIC PANEL
Anion gap: 9 (ref 5–15)
BUN: 18 mg/dL (ref 8–23)
CO2: 23 mmol/L (ref 22–32)
Calcium: 8.4 mg/dL — ABNORMAL LOW (ref 8.9–10.3)
Chloride: 108 mmol/L (ref 98–111)
Creatinine, Ser: 1.16 mg/dL — ABNORMAL HIGH (ref 0.44–1.00)
GFR calc Af Amer: 56 mL/min — ABNORMAL LOW (ref >60)
GFR calc non Af Amer: 49 mL/min — ABNORMAL LOW (ref >60)
Glucose, Bld: 89 mg/dL (ref 70–99)
Potassium: 3.8 mmol/L (ref 3.5–5.1)
Sodium: 140 mmol/L (ref 135–145)

## 2019-05-28 LAB — GLUCOSE, CAPILLARY: Glucose-Capillary: 136 mg/dL — ABNORMAL HIGH (ref 70–99)

## 2019-05-28 MED ORDER — GABAPENTIN 600 MG PO TABS
600.0000 mg | ORAL_TABLET | Freq: Three times a day (TID) | ORAL | Status: DC
  Administered 2019-05-28: 09:00:00 600 mg via ORAL
  Filled 2019-05-28: qty 1

## 2019-05-28 NOTE — Discharge Summary (Signed)
Tammy Armstrong NAME: Tammy Armstrong    MR#:  [id number]  DATE OF BIRTH:  07-26-1950  DATE OF ADMISSION:  05/27/2019 ADMITTING PHYSICIAN: Tammy Mandes, MD  DATE OF DISCHARGE: 05/28/2019  PRIMARY CARE PHYSICIAN: Tammy Curry, MD    ADMISSION DIAGNOSIS:  AKI (acute kidney injury) ([place]) [N17.9] Pain of upper abdomen [R10.10] Syncope, unspecified syncope type [R55] Nausea and vomiting, intractability of vomiting not specified, unspecified vomiting type [R11.2] Syncope [R55]  DISCHARGE DIAGNOSIS:  syncope due to dehydration from acute gastritis acute renal failure secondary to dehydration from nausea vomiting improved   SECONDARY DIAGNOSIS:   Past Medical History:  Diagnosis Date  . Anxiety   . Arthritis   . Carotid artery occlusion   . Cigarette smoker one half pack a day or less   . COPD (chronic obstructive pulmonary disease) ([place]) 07/04/2011  . Depression   . Diabetes mellitus   . Diabetes mellitus 07/04/2011  . GERD (gastroesophageal reflux disease)   . Hepatitis C   . Hypercholesterolemia   . Hypertension   . Migraines   . Pancreatitis   . 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 Respiratory failure ([place]) 07/04/2011  . Seizure ([place]) 07/02/2011  . Shortness of breath dyspnea   . Stroke [facility])     HOSPITAL COURSE:   Tammy Armstrong a 68 y.o.femalewith a history of diabetes, COPD, hepatitis C, hypertension, seizure disorder who presents for evaluation of abdominal pain and vomiting. Patient reports 2 days of cramping upper abdominal pain associated with several episodes of nonbloody nonbilious emesis. She has had diarrhea however that is chronic for her.   Syncope due to orthostatic hypotension with Acute Renal fialure  due to GI losses and oral home meds precipitating it -feels better today-- tolerating regular diet -BP improving -d/ced lisinopril/HCTZ  2. Acute renal failure likely from nausea vomiting. - I  have dc/ed ACE inhibitor hydrochlorothiazide -received IV  hydration  -baseline creat 1.1 -avoid nephrotoxins -creatinine much improved. -Nausea vomiting resolved  3. Intractable nausea vomiting cause not clear. - CT abdomen unremarkable UA unremarkable. Abdomen appears benign. Will start diet and advance as tolerated. Could be from gastroenteritis. Had a similar episode in early part of this year.  4. Diabetes mellitus type 2 we will keep patient on sliding scale coverage. -Discontinued metformin to elevated creatinine -continue glipizide. Patient advised to measure sugars at home  5. Hypertension - we will keep patient on as needed IV hydralazine  -continue metoprolol.  -Holding lisinopril hydrochlorothiazide due to acute renal failure.  6. COPD not actively wheezing. Pt is chronic smoker--not motivated to quit.  7. Depression on Seroquel, cymbalta and geodon  Leave at home alone. No family around per Pt patient overall feels better. She wants to go home. Will discharge her to home.  CM to help with discharge planning. CONSULTS OBTAINED:    DRUG ALLERGIES:   Allergies  Allergen Reactions  . Promethazine Other (See Comments)    studdering Other Reaction: IMPAIRS SPEECH, UNABLE TO VERB studdering Other Reaction: IMPAIRS SPEECH, UNABLE TO VERB   . Sumatriptan     Other reaction(s): Other (See Comments) Other Reaction: PANIC ATTACK  . Ciprofloxacin Other (See Comments)    Medication is contraindicated with some of pts other meds.   Delirium Delirium   . Doxycycline Nausea And Vomiting  . Imitrex [Sumatriptan Base] Other (See Comments)    Pt states that it "puts her out of this world".    Marland Kitchen  Morphine And Related Nausea And Vomiting  . Penicillins Nausea And Vomiting and Other (See Comments)    Pt is unable to answer additional questions about this medication.    . Ranitidine Hcl Other (See Comments)    studdering studdering unknown   . Zantac  [Ranitidine Hcl] Other (See Comments)    Reaction:  Unknown   . Ketorolac Tromethamine Rash    DISCHARGE MEDICATIONS:   Allergies as of 05/28/2019      Reactions   Promethazine Other (See Comments)   studdering Other Reaction: IMPAIRS SPEECH, UNABLE TO VERB studdering Other Reaction: IMPAIRS SPEECH, UNABLE TO VERB   Sumatriptan    Other reaction(s): Other (See Comments) Other Reaction: PANIC ATTACK   Ciprofloxacin Other (See Comments)   Medication is contraindicated with some of pts other meds.   Delirium Delirium   Doxycycline Nausea And Vomiting   Imitrex [sumatriptan Base] Other (See Comments)   Pt states that it "puts her out of this world".     Morphine And Related Nausea And Vomiting   Penicillins Nausea And Vomiting, Other (See Comments)   Pt is unable to answer additional questions about this medication.     Ranitidine Hcl Other (See Comments)   studdering studdering unknown   Zantac [ranitidine Hcl] Other (See Comments)   Reaction:  Unknown    Ketorolac Tromethamine Rash      Medication List    STOP taking these medications   budesonide-formoterol 80-4.5 MCG/ACT inhaler Commonly known as: SYMBICORT   lisinopril-hydrochlorothiazide 20-25 MG tablet Commonly known as: ZESTORETIC   meloxicam 15 MG tablet Commonly known as: MOBIC   metFORMIN 1000 MG tablet Commonly known as: GLUCOPHAGE     TAKE these medications   acetaminophen 500 MG tablet Commonly known as: TYLENOL Take 1,000 mg by mouth every 8 (eight) hours as needed for pain.   albuterol 108 (90 Base) MCG/ACT inhaler Commonly known as: VENTOLIN HFA Inhale 2 puffs into the lungs every 6 (six) hours as needed for wheezing or shortness of breath.   Aspirin Low Dose 81 MG EC tablet Generic drug: aspirin TAKE ONE TABLET BY MOUTH EVERY DAY. *DO NOT CRUSH* What changed: See the new instructions.   atorvastatin 80 MG tablet Commonly known as: LIPITOR Take 80 mg by mouth daily.   busPIRone 15 MG  tablet Commonly known as: BUSPAR Take 15 mg by mouth 3 (three) times daily.   clopidogrel 75 MG tablet Commonly known as: PLAVIX TAKE ONE TABLET BY MOUTH EVERY DAY WITH BREAKFAST What changed: See the new instructions.   diclofenac sodium 1 % Gel Commonly known as: VOLTAREN Apply 2 g topically 4 (four) times daily.   docusate sodium 100 MG capsule Commonly known as: COLACE Take 100 mg by mouth daily.   DULoxetine 60 MG capsule Commonly known as: CYMBALTA Take 60 mg by mouth daily.   FerrouSul 325 (65 FE) MG tablet Generic drug: ferrous sulfate Take 325 mg by mouth daily with breakfast.   fluticasone 50 MCG/ACT nasal spray Commonly known as: FLONASE Place 2 sprays into both nostrils daily.   fluticasone furoate-vilanterol 100-25 MCG/INH Aepb Commonly known as: BREO ELLIPTA Inhale 1 puff into the lungs daily.   gabapentin 600 MG tablet Commonly known as: NEURONTIN Take 600 mg by mouth 3 (three) times daily. May take an extra dose at night if needed   glipiZIDE 5 MG tablet Commonly known as: GLUCOTROL Take 2.5-5 mg by mouth 2 (two) times daily before a meal. Take one tablet (5mg )  by mouth at breakfast and one-half tablet (2.5mg ) at lunch   metoprolol succinate 50 MG 24 hr tablet Commonly known as: TOPROL-XL Take 50 mg by mouth daily.   pantoprazole 40 MG tablet Commonly known as: PROTONIX Take 40 mg by mouth 2 (two) times daily.   QUEtiapine 25 MG tablet Commonly known as: SEROQUEL Take 25 mg by mouth 3 (three) times daily. Take one tablet (25 mg) by mouth every morning, one tablet (25 mg) at noon and two tablets (50mg ) at bedtime   traZODone 50 MG tablet Commonly known as: DESYREL Take 50 mg by mouth at bedtime.   ziprasidone 20 MG capsule Commonly known as: GEODON Take 20 mg by mouth 2 (two) times daily.       If you experience worsening of your admission symptoms, develop shortness of breath, life threatening emergency, suicidal or homicidal thoughts  you must seek medical attention immediately by calling 911 or calling your MD immediately  if symptoms less severe.  You Must read complete instructions/literature along with all the possible adverse reactions/side effects for all the Medicines you take and that have been prescribed to you. Take any new Medicines after you have completely understood and accept all the possible adverse reactions/side effects.   Please note  You were cared for by a hospitalist during your hospital stay. If you have any questions about your discharge medications or the care you received while you were in the hospital after you are discharged, you can call the unit and asked to speak with the hospitalist on call if the hospitalist that took care of you is not available. Once you are discharged, your primary care physician will handle any further medical issues. Please note that NO REFILLS for any discharge medications will be authorized once you are discharged, as it is imperative that you return to your primary care physician (or establish a relationship with a primary care physician if you do not have one) for your aftercare needs so that they can reassess your need for medications and monitor your lab values. Today   SUBJECTIVE   Patient feels better. Eating good. No nausea vomiting. Has chronic diarrhea. VITAL SIGNS:  Blood pressure (!) 115/49, pulse 84, temperature 99.1 F (37.3 C), temperature source Oral, resp. rate 16, height 5\' 1"  (1.549 m), weight 78 kg, SpO2 99 %.  I/O:    Intake/Output Summary (Last 24 hours) at 05/28/2019 1303 Last data filed at 05/28/2019 [id number] Gross per 24 hour  Intake 1255.09 ml  Output -  Net 1255.09 ml    PHYSICAL EXAMINATION:  GENERAL:  68 y.o.-year-old patient lying in the bed with no acute distress.  EYES: Pupils equal, round, reactive to light and accommodation. No scleral icterus. Extraocular muscles intact.  HEENT: Head atraumatic, normocephalic. Oropharynx and  nasopharynx clear.  NECK:  Supple, no jugular venous distention. No thyroid enlargement, no tenderness.  LUNGS: Normal breath sounds bilaterally, no wheezing, rales,rhonchi or crepitation. No use of accessory muscles of respiration.  CARDIOVASCULAR: S1, S2 normal. No murmurs, rubs, or gallops.  ABDOMEN: Soft, non-tender, non-distended. Bowel sounds present. No organomegaly or mass.  EXTREMITIES: No pedal edema, cyanosis, or clubbing.  NEUROLOGIC: Cranial nerves II through XII are intact. Muscle strength 5/5 in all extremities. Sensation intact. Gait not checked.  PSYCHIATRIC: The patient is alert and oriented x 3.  SKIN: No obvious rash, lesion, or ulcer.   DATA REVIEW:   CBC  Recent Labs  Lab 05/28/19 0414  WBC 9.4  HGB 10.6*  HCT 32.2*  PLT 269    Chemistries  Recent Labs  Lab 05/27/19 0022  05/28/19 0414  NA  --   --  140  K  --   --  3.8  CL  --   --  108  CO2  --   --  23  GLUCOSE  --   --  89  BUN  --   --  18  CREATININE  --    < > 1.16*  CALCIUM  --   --  8.4*  AST 36  --   --   ALT 12  --   --   ALKPHOS 53  --   --   BILITOT 0.6  --   --    < > = values in this interval not displayed.    Microbiology Results   Recent Results (from the past 240 hour(s))  Urine Culture     Status: Abnormal   Collection Time: 05/27/19  2:18 AM   Specimen: Urine, Random  Result Value Ref Range Status   Specimen Description   Final    URINE, RANDOM Performed at [facility], [address] [place], [place] [address]    Special Requests   Final    NONE Performed at [facility], [address] [place], [place] [address]    Culture (A)  Final    <10,000 COLONIES/mL INSIGNIFICANT GROWTH Performed at [facility] [address] [place], [place] [address]    Report Status 05/28/2019 FINAL  Final  SARS CORONAVIRUS 2 (TAT 6-24 HRS) Nasopharyngeal Nasopharyngeal Swab     Status: None   Collection Time: 05/27/19  4:49 AM   Specimen:  Nasopharyngeal Swab  Result Value Ref Range Status   SARS Coronavirus 2 NEGATIVE NEGATIVE Final    Comment: (NOTE) SARS-CoV-2 target nucleic acids are NOT DETECTED. The SARS-CoV-2 RNA is generally detectable in upper and lower respiratory specimens during the acute phase of infection. Negative results do not preclude SARS-CoV-2 infection, do not rule out co-infections with other pathogens, and should not be used as the sole basis for treatment or other patient management decisions. Negative results must be combined with clinical observations, patient history, and epidemiological information. The expected result is Negative. Fact Sheet for Patients: SugarRoll.be Fact Sheet for Healthcare Providers: https://www.woods-mathews.com/ This test is not yet approved or cleared by the [place] FDA and  has been authorized for detection and/or diagnosis of SARS-CoV-2 by FDA under an Emergency Use Authorization (EUA). This EUA will remain  in effect (meaning this test can be used) for the duration of the COVID-19 declaration under Section 56 4(b)(1) of the Act, 21 U.S.C. section 360bbb-3(b)(1), unless the authorization is terminated or revoked sooner. Performed at [facility], [place] [address] [place],  [address]     RADIOLOGY:  Ct Abdomen Pelvis Wo Contrast  Result Date: 05/27/2019 CLINICAL DATA:  Abdominal pain, vomiting EXAM: CT ABDOMEN AND PELVIS WITHOUT CONTRAST TECHNIQUE: Multidetector CT imaging of the abdomen and pelvis was performed following the standard protocol without IV contrast. COMPARISON:  10/10/2018 FINDINGS: Lower chest: Lung bases are clear. No effusions. Heart is normal size. Hepatobiliary: No focal liver abnormality is seen. Status post cholecystectomy. No biliary dilatation. Pancreas: No focal abnormality or ductal dilatation. Spleen: No focal abnormality.  Normal size. Adrenals/Urinary Tract: No adrenal  abnormality. No focal renal abnormality. No stones or hydronephrosis. Urinary bladder is unremarkable. Stomach/Bowel: Stomach, large and small bowel grossly unremarkable. Vascular/Lymphatic: Aortic  atherosclerosis. No enlarged abdominal or pelvic lymph nodes. Reproductive: Prior hysterectomy.  No adnexal masses. Other: No free fluid or free air. Musculoskeletal: No acute bony abnormality. IMPRESSION: No acute findings in the abdomen or pelvis. Aortic atherosclerosis. Electronically Signed   By: Rolm Baptise M.D.   On: 05/27/2019 02:56   Dg Chest 2 View  Result Date: 05/26/2019 CLINICAL DATA:  Abdominal pain, vomiting, syncope EXAM: CHEST - 2 VIEW COMPARISON:  10/20/2018 FINDINGS: Heart and mediastinal contours are within normal limits. No focal opacities or effusions. No acute bony abnormality. IMPRESSION: No active cardiopulmonary disease. Electronically Signed   By: Rolm Baptise M.D.   On: 05/26/2019 22:27   Ct Head Wo Contrast  Result Date: 05/26/2019 CLINICAL DATA:  Syncope, head trauma, ataxia. EXAM: CT HEAD WITHOUT CONTRAST TECHNIQUE: Contiguous axial images were obtained from the base of the skull through the vertex without intravenous contrast. COMPARISON:  06/27/2016 FINDINGS: Brain: Calcifications centrally within the pons is again noted and stable since prior study. 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 There is atrophy and chronic small vessel disease changes. No acute intracranial abnormality. Specifically, no hemorrhage, hydrocephalus, mass lesion, acute infarction, or significant intracranial injury. Vascular: No hyperdense vessel or unexpected calcification. Skull: No acute calvarial abnormality. Sinuses/Orbits: Visualized paranasal sinuses and mastoids clear. Orbital soft tissues unremarkable. Other: Soft tissue swelling in the left posterior parietal region. IMPRESSION: No acute intracranial abnormality. Electronically Signed   By: Rolm Baptise M.D.   On:  05/26/2019 22:39     CODE STATUS:     Code Status Orders  (From admission, onward)         Start     Ordered   05/27/19 0639  Full code  Continuous     05/27/19 0639        Code Status History    Date Active Date Inactive Code Status Order ID Comments User Context   10/12/2018 1211 10/14/2018 1447 Full Code [id number]  Dustin Flock, MD Inpatient   06/28/2016 0022 06/28/2016 2152 Full Code [id number]  Harvie Bridge, DO Inpatient   05/18/2015 1507 05/19/2015 1716 Full Code [id number]  Algernon Huxley, MD Inpatient   04/06/2015 2123 04/08/2015 1702 Full Code [id number]  Bettey Costa, MD Inpatient   Advance Care Planning Activity      TOTAL TIME TAKING CARE OF THIS PATIENT: **40* minutes.    Tammy Armstrong M.D on 05/28/2019 at 1:03 PM  Between 7am to 6pm - Pager - [phone] After 6pm go to [email or web address] - password [email or web address]    CC: Primary care physician; Tammy Curry, MD

## 2019-05-28 NOTE — Discharge Instructions (Signed)
Patient advised smoking cessation °

## 2019-05-28 NOTE — Care Management Obs Status (Signed)
[place] NOTIFICATION   Patient Details  Name: ALLICYN DORRIETY MRN: [id number] Date of Birth: 04/11/1951   Medicare Observation Status Notification Given:  Yes    Shelbie Hutching, RN 05/28/2019, 2:07 PM

## 2019-05-28 NOTE — Plan of Care (Signed)
  Problem: Education: Goal: Knowledge of General Education information will improve Description: Including pain rating scale, medication(s)/side effects and non-pharmacologic comfort measures 05/28/2019 1709 by Jenene Slicker, RN Outcome: Adequate for Discharge 05/28/2019 1709 by Jenene Slicker, RN Outcome: Adequate for Discharge   Problem: Health Behavior/Discharge Planning: Goal: Ability to manage health-related needs will improve 05/28/2019 1709 by Jenene Slicker, RN Outcome: Adequate for Discharge 05/28/2019 1709 by Jenene Slicker, RN Outcome: Adequate for Discharge   Problem: Clinical Measurements: Goal: Ability to maintain clinical measurements within normal limits will improve 05/28/2019 1709 by Jenene Slicker, RN Outcome: Adequate for Discharge 05/28/2019 1709 by Jenene Slicker, RN Outcome: Adequate for Discharge Goal: Will remain free from infection 05/28/2019 1709 by Jenene Slicker, RN Outcome: Adequate for Discharge 05/28/2019 1709 by Jenene Slicker, RN Outcome: Adequate for Discharge Goal: Diagnostic test results will improve 05/28/2019 1709 by Jenene Slicker, RN Outcome: Adequate for Discharge 05/28/2019 1709 by Jenene Slicker, RN Outcome: Adequate for Discharge Goal: Respiratory complications will improve 05/28/2019 1709 by Jenene Slicker, RN Outcome: Adequate for Discharge 05/28/2019 1709 by Jenene Slicker, RN Outcome: Adequate for Discharge Goal: Cardiovascular complication will be avoided 05/28/2019 1709 by Jenene Slicker, RN Outcome: Adequate for Discharge 05/28/2019 1709 by Jenene Slicker, RN Outcome: Adequate for Discharge   Problem: Activity: Goal: Risk for activity intolerance will decrease 05/28/2019 1709 by Jenene Slicker, RN Outcome: Adequate for Discharge 05/28/2019 1709 by Jenene Slicker, RN Outcome: Adequate for Discharge   Problem: Nutrition: Goal: Adequate nutrition will be maintained 05/28/2019 1709 by Jenene Slicker, RN Outcome:  Adequate for Discharge 05/28/2019 1709 by Jenene Slicker, RN Outcome: Adequate for Discharge   Problem: Coping: Goal: Level of anxiety will decrease 05/28/2019 1709 by Jenene Slicker, RN Outcome: Adequate for Discharge 05/28/2019 1709 by Jenene Slicker, RN Outcome: Adequate for Discharge   Problem: Elimination: Goal: Will not experience complications related to bowel motility 05/28/2019 1709 by Jenene Slicker, RN Outcome: Adequate for Discharge 05/28/2019 1709 by Jenene Slicker, RN Outcome: Adequate for Discharge Goal: Will not experience complications related to urinary retention 05/28/2019 1709 by Jenene Slicker, RN Outcome: Adequate for Discharge 05/28/2019 1709 by Jenene Slicker, RN Outcome: Adequate for Discharge   Problem: Pain Managment: Goal: General experience of comfort will improve 05/28/2019 1709 by Jenene Slicker, RN Outcome: Adequate for Discharge 05/28/2019 1709 by Jenene Slicker, RN Outcome: Adequate for Discharge   Problem: Safety: Goal: Ability to remain free from injury will improve 05/28/2019 1709 by Jenene Slicker, RN Outcome: Adequate for Discharge 05/28/2019 1709 by Jenene Slicker, RN Outcome: Adequate for Discharge   Problem: Skin Integrity: Goal: Risk for impaired skin integrity will decrease 05/28/2019 1709 by Jenene Slicker, RN Outcome: Adequate for Discharge 05/28/2019 1709 by Jenene Slicker, RN Outcome: Adequate for Discharge

## 2019-05-28 NOTE — TOC Transition Note (Signed)
Transition of Care [facility]) - CM/SW Discharge Note   Patient Details  Name: Tammy Armstrong MRN: [id number] Date of Birth: Oct 21, 1950  [facility]) CM/SW Contact:  Shelbie Hutching, RN Phone Number: 05/28/2019, 1:13 PM   Clinical Narrative:     [place] voucher provided.   Final next level of care: Home/Self Care Barriers to Discharge: No Barriers Identified   Patient Goals and CMS Choice        Discharge Placement                       Discharge Plan and Services                                     Social Determinants of Health (SDOH) Interventions     Readmission Risk Interventions No flowsheet data found.

## 2019-05-28 NOTE — Progress Notes (Signed)
Patient was sleeping on change of shift. Woke up and received her HS meds. No acute distress noted. Report given to Kennyth Lose, RN on oncology. Patient was taken to floor via stretcher by this Probation officer.

## 2019-05-28 NOTE — Progress Notes (Signed)
Patient transported home via [employer].

## 2019-05-28 NOTE — Care Management CC44 (Signed)
Condition Code 44 Documentation Completed  Patient Details  Name: Tammy Armstrong MRN: [id number] Date of Birth: [birth year]   Condition Code 44 given:  Yes Patient signature on Condition Code 44 notice:  Yes Documentation of 2 MD's agreement:  Yes Code 44 added to claim:  Yes    Shelbie Hutching, RN 05/28/2019, 2:07 PM

## 2019-05-29 ENCOUNTER — Other Ambulatory Visit: Payer: Self-pay | Admitting: Family Medicine

## 2019-05-29 DIAGNOSIS — Z780 Asymptomatic menopausal state: Secondary | ICD-10-CM

## 2019-06-16 ENCOUNTER — Encounter: Payer: Self-pay | Admitting: Emergency Medicine

## 2019-06-16 ENCOUNTER — Emergency Department
Admission: EM | Admit: 2019-06-16 | Discharge: 2019-06-16 | Disposition: A | Discharge status: Home / Self Care | Payer: Medicare Other | Attending: Emergency Medicine | Admitting: Emergency Medicine

## 2019-06-16 ENCOUNTER — Other Ambulatory Visit: Payer: Self-pay

## 2019-06-16 DIAGNOSIS — Z5321 Procedure and treatment not carried out due to patient leaving prior to being seen by health care provider: Secondary | ICD-10-CM | POA: Insufficient documentation

## 2019-06-16 DIAGNOSIS — R220 Localized swelling, mass and lump, head: Secondary | ICD-10-CM | POA: Diagnosis present

## 2019-06-16 LAB — COMPREHENSIVE METABOLIC PANEL
ALT: 16 U/L (ref 0–44)
AST: 25 U/L (ref 15–41)
Albumin: 2.9 g/dL — ABNORMAL LOW (ref 3.5–5.0)
Alkaline Phosphatase: 72 U/L (ref 38–126)
Anion gap: 11 (ref 5–15)
BUN: 5 mg/dL — ABNORMAL LOW (ref 8–23)
CO2: 28 mmol/L (ref 22–32)
Calcium: 8.6 mg/dL — ABNORMAL LOW (ref 8.9–10.3)
Chloride: 101 mmol/L (ref 98–111)
Creatinine, Ser: 0.88 mg/dL (ref 0.44–1.00)
GFR calc Af Amer: >60 mL/min (ref >60)
GFR calc non Af Amer: >60 mL/min (ref >60)
Glucose, Bld: 75 mg/dL (ref 70–99)
Potassium: 3 mmol/L — ABNORMAL LOW (ref 3.5–5.1)
Sodium: 140 mmol/L (ref 135–145)
Total Bilirubin: 0.6 mg/dL (ref 0.3–1.2)
Total Protein: 6.2 g/dL — ABNORMAL LOW (ref 6.5–8.1)

## 2019-06-16 LAB — CBC WITH DIFFERENTIAL/PLATELET
Abs Immature Granulocytes: 0.02 10*3/uL (ref 0.00–0.07)
Basophils Absolute: 0 10*3/uL (ref 0.0–0.1)
Basophils Relative: 1 %
Eosinophils Absolute: 0.2 10*3/uL (ref 0.0–0.5)
Eosinophils Relative: 2 %
HCT: 33.9 % — ABNORMAL LOW (ref 36.0–46.0)
Hemoglobin: 11.2 g/dL — ABNORMAL LOW (ref 12.0–15.0)
Immature Granulocytes: 0 %
Lymphocytes Relative: 37 %
Lymphs Abs: 2.7 10*3/uL (ref 0.7–4.0)
MCH: 31.8 pg (ref 26.0–34.0)
MCHC: 33 g/dL (ref 30.0–36.0)
MCV: 96.3 fL (ref 80.0–100.0)
Monocytes Absolute: 0.6 10*3/uL (ref 0.1–1.0)
Monocytes Relative: 8 %
Neutro Abs: 3.8 10*3/uL (ref 1.7–7.7)
Neutrophils Relative %: 52 %
Platelets: 296 10*3/uL (ref 150–400)
RBC: 3.52 MIL/uL — ABNORMAL LOW (ref 3.87–5.11)
RDW: 14.5 % (ref 11.5–15.5)
WBC: 7.2 10*3/uL (ref 4.0–10.5)
nRBC: 0 % (ref 0.0–0.2)

## 2019-06-16 LAB — T4, FREE: Free T4: 0.78 ng/dL (ref 0.61–1.12)

## 2019-06-16 LAB — TSH: TSH: 3.641 u[IU]/mL (ref 0.350–4.500)

## 2019-06-16 NOTE — ED Triage Notes (Signed)
C/O facial swelling x 2 days.  States eyes are closed shut when she wakes up in the morning.  Swelling seen to eye lids and right cheek.  Voice clear and strong.  NAD.

## 2019-06-16 NOTE — ED Notes (Signed)
Patient states she stopped taking BP medications 1 week ago.

## 2019-06-17 ENCOUNTER — Telehealth: Payer: Self-pay | Admitting: Emergency Medicine

## 2019-06-17 NOTE — Telephone Encounter (Signed)
Called patient due to lwot to inquire about condition and follow up plans. Says she hasn't called her doctor today.  I told her she can try to call her doctor and she can always return here.

## 2019-07-07 ENCOUNTER — Ambulatory Visit (INDEPENDENT_AMBULATORY_CARE_PROVIDER_SITE_OTHER): Payer: Medicare Other | Admitting: Vascular Surgery

## 2019-07-07 ENCOUNTER — Encounter (INDEPENDENT_AMBULATORY_CARE_PROVIDER_SITE_OTHER): Payer: Self-pay

## 2019-07-07 ENCOUNTER — Other Ambulatory Visit: Payer: Self-pay

## 2019-07-07 ENCOUNTER — Encounter (INDEPENDENT_AMBULATORY_CARE_PROVIDER_SITE_OTHER): Payer: Self-pay | Admitting: Vascular Surgery

## 2019-07-07 VITALS — BP 153/83 | HR 104 | Resp 16 | Wt 148.4 lb

## 2019-07-07 DIAGNOSIS — I10 Essential (primary) hypertension: Secondary | ICD-10-CM | POA: Diagnosis not present

## 2019-07-07 DIAGNOSIS — R55 Syncope and collapse: Secondary | ICD-10-CM

## 2019-07-07 DIAGNOSIS — I6523 Occlusion and stenosis of bilateral carotid arteries: Secondary | ICD-10-CM | POA: Diagnosis not present

## 2019-07-07 DIAGNOSIS — E119 Type 2 diabetes mellitus without complications: Secondary | ICD-10-CM | POA: Diagnosis not present

## 2019-07-07 NOTE — Patient Instructions (Signed)
Carotid Artery Disease  The carotid arteries are arteries on both sides of the neck. They carry blood to the brain, face, and neck. Carotid artery disease happens when these arteries become smaller (narrow) or get blocked. If these arteries become smaller or get blocked, you are more likely to have a stroke or a warning stroke (transient ischemic attack). Follow these instructions at home:  Take over-the-counter and prescription medicines only as told by your doctor.  Make sure you understand all instructions about your medicines. Do not stop taking your medicines without talking to your doctor first.  Follow your doctor's diet instructions. It is important to follow a healthy diet. ? Eat foods that include plenty of: ? Fresh fruits. ? Vegetables. ? Lean meats. ? Avoid these foods: ? Foods that are high in fat. ? Foods that are high in salt (sodium). ? Foods that are fried. ? Foods that are processed. ? Foods that have few good nutrients (poor nutritional value).  Keep a healthy weight.  Stay active. Get at least 30 minutes of activity every day.  Do not smoke.  Limit alcohol use to: ? No more than 2 drinks a day for men. ? No more than 1 drink a day for women who are not pregnant.  Do not use illegal drugs.  Keep all follow-up visits as told by your doctor. This is important. Contact a doctor if: Get help right away if:  You have any symptoms of stroke or TIA. The acronym BEFAST is an easy way to remember the main warning signs of stroke. ? B = Balance problems. Signs include dizziness, sudden trouble walking, or loss of balance ? E = Eye problems. This includes trouble seeing or a sudden change in vision. ? F = Face changes. This includes sudden weakness or numbness of the face, or the face or eyelid drooping to one side. ? A = Arm weakness or numbness. This happens suddenly and usually on one side of the body. ? S = Speech problems. This includes trouble speaking or  trouble understanding. ? T = Time. Time to call 911 or seek emergency care. Do not wait to see if symptoms go away. Make note of the time your symptoms started.  Other signs of stroke may include: ? A sudden, severe headache with no known cause. ? Feeling sick to your stomach (nauseous) or throwing up (vomiting). ? Seizure. Call your local emergency services (911 in [place] Do notdrive yourself to the clinic or hospital. Summary  The carotid arteries are arteries on both sides of the neck.  If these arteries get smaller or get blocked, you are more likely to have a stroke or a warning stroke (transient ischemic attack).  Take over-the-counter and prescription medicines only as told by your doctor.  Keep all follow-up visits as told by your doctor. This is important. This information is not intended to replace advice given to you by your health care provider. Make sure you discuss any questions you have with your health care provider. Document Released: 06/25/2012 Document Revised: 07/04/2017 Document Reviewed: 07/04/2017 Elsevier Patient Education  2020 [employer]  

## 2019-07-07 NOTE — Assessment & Plan Note (Signed)
blood pressure control important in reducing the progression of atherosclerotic disease. On appropriate oral medications.  

## 2019-07-07 NOTE — Assessment & Plan Note (Signed)
blood glucose control important in reducing the progression of atherosclerotic disease. Also, involved in wound healing. On appropriate medications.  

## 2019-07-07 NOTE — Progress Notes (Signed)
MRN : [id number]  Tammy Armstrong is a 68 y.o. ([birth year]) female who presents with chief complaint of  Chief Complaint  Patient presents with  . Follow-up    ref Astrid Divine for carotid lesions  .  History of Present Illness: Patient returns in follow-up of her carotid disease on referral from her primary care physician.  She has not been seen in roughly 3 years.  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  This is interesting because she does not have a stent in her left carotid artery.  She has a left carotid endarterectomy that was performed over 4 years ago.  The patient reports she has had 3 syncopal episodes.  She would have passing out spells with no inciting event or clear cause.  She wakes up and does not have any residual deficits.  There is no prodrome.  No confusion.  No arm or leg weakness or numbness, speech or swallowing difficulty, or visual symptoms.  She does not think she has had her carotid checked since it was last evaluated in December 2017.  She remains on aspirin, Plavix, and a statin agent  Current Outpatient Medications  Medication Sig Dispense Refill  . acetaminophen (TYLENOL) 500 MG tablet Take 1,000 mg by mouth every 8 (eight) hours as needed for pain.    Marland Kitchen albuterol (PROVENTIL HFA;VENTOLIN HFA) 108 (90 BASE) MCG/ACT inhaler Inhale 2 puffs into the lungs every 6 (six) hours as needed for wheezing or shortness of breath.     . ASPIRIN LOW DOSE 81 MG EC tablet TAKE ONE TABLET BY MOUTH EVERY DAY. *DO NOT CRUSH* (Patient taking differently: Take 81 mg by mouth daily. ) 28 tablet 0  . atorvastatin (LIPITOR) 80 MG tablet Take 80 mg by mouth daily.    . busPIRone (BUSPAR) 15 MG tablet Take 15 mg by mouth 3 (three) times daily.     . clonazePAM (KLONOPIN) 1 MG tablet Take 1 mg by mouth 2  (two) times daily.    . clopidogrel (PLAVIX) 75 MG tablet TAKE ONE TABLET BY MOUTH EVERY DAY WITH BREAKFAST (Patient taking differently: Take 75 mg by mouth daily. ) 28 tablet 0  . diclofenac sodium (VOLTAREN) 1 % GEL Apply 2 g topically 4 (four) times daily.    Marland Kitchen docusate sodium (COLACE) 100 MG capsule Take 100 mg by mouth daily.     . DULoxetine (CYMBALTA) 60 MG capsule Take 60 mg by mouth daily.     . ferrous sulfate (FERROUSUL) 325 (65 FE) MG tablet Take 325 mg by mouth daily with breakfast.    . fluticasone (FLONASE) 50 MCG/ACT nasal spray Place 2 sprays into both nostrils daily.    . fluticasone furoate-vilanterol (BREO ELLIPTA) 100-25 MCG/INH AEPB Inhale 1 puff into the lungs daily.    Marland Kitchen gabapentin (NEURONTIN) 600 MG tablet Take 600 mg by mouth 3 (three) times daily. May take an extra dose at night if needed    . glipiZIDE (GLUCOTROL) 5 MG tablet Take 2.5-5 mg by mouth 2 (two) times daily before a meal. Take one tablet (5mg ) by mouth at breakfast and one-half tablet (2.5mg ) at lunch    . metoprolol (TOPROL-XL) 50 MG 24 hr tablet Take 50 mg by mouth daily.      Marland Kitchen  pantoprazole (PROTONIX) 40 MG tablet Take 40 mg by mouth 2 (two) times daily.    . QUEtiapine (SEROQUEL) 25 MG tablet Take 25 mg by mouth 3 (three) times daily. Take one tablet (25 mg) by mouth every morning, one tablet (25 mg) at noon and two tablets (50mg ) at bedtime    . traZODone (DESYREL) 50 MG tablet Take 50 mg by mouth at bedtime.    . ziprasidone (GEODON) 20 MG capsule Take 20 mg by mouth 2 (two) times daily.     No current facility-administered medications for this visit.    Past Medical History:  Diagnosis Date  . Anxiety   . Arthritis   . Carotid artery occlusion   . Cigarette smoker one half pack a day or less   . COPD (chronic obstructive pulmonary disease) ([place]) 07/04/2011  . Depression   . Diabetes mellitus   . Diabetes mellitus 07/04/2011  . GERD (gastroesophageal reflux disease)   . Hepatitis C   .  Hypercholesterolemia   . Hypertension   . Migraines   . Pancreatitis   . 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 Respiratory failure ([place]) 07/04/2011  . Seizure ([place]) 07/02/2011  . Shortness of breath dyspnea   . Stroke [facility])     Past Surgical History:  Procedure Laterality Date  . ABDOMINAL HYSTERECTOMY    . APPENDECTOMY    . BACK SURGERY  2012   cyst removed from spine , [place]  . CHOLECYSTECTOMY    . ENDARTERECTOMY Left 05/18/2015   Procedure: ENDARTERECTOMY CAROTID;  Surgeon: Algernon Huxley, MD;  Location: [facility] ORS;  Service: Vascular;  Laterality: Left;     Social History   Tobacco Use  . Smoking status: Current Every Day Smoker    Packs/day: 1.00    Years: 52.00    Pack years: 52.00    Types: Cigarettes  . Smokeless tobacco: Never Used  Substance Use Topics  . Alcohol use: No  . Drug use: No    Family History  Problem Relation Age of Onset  . Hypertension Mother   . Heart attack Mother   . Diabetes Mother   . Anxiety disorder Mother   . Stroke Mother   . Diabetes Father   . Diabetes Brother   . Alcohol abuse Other        siblings  . Hypertension Brother   . Breast cancer Neg Hx      Allergies  Allergen Reactions  . Promethazine Other (See Comments)    studdering Other Reaction: IMPAIRS SPEECH, UNABLE TO VERB studdering Other Reaction: IMPAIRS SPEECH, UNABLE TO VERB   . Sumatriptan     Other reaction(s): Other (See Comments) Other Reaction: PANIC ATTACK  . Ciprofloxacin Other (See Comments)    Medication is contraindicated with some of pts other meds.   Delirium Delirium   . Doxycycline Nausea And Vomiting  . Imitrex [Sumatriptan Base] Other (See Comments)    Pt states that it "puts her out of this world".    . Morphine And Related Nausea And Vomiting  . Penicillins Nausea And Vomiting and Other (See Comments)    Pt is unable to answer additional questions about this medication.    . Ranitidine Hcl Other (See Comments)    studdering  studdering unknown   . Zantac [Ranitidine Hcl] Other (See Comments)    Reaction:  Unknown   . Ketorolac Tromethamine Rash     REVIEW OF SYSTEMS (Negative unless checked)  Constitutional: [] Weight loss  [] Fever  [] Chills  Cardiac: [] Chest pain   [] Chest pressure   [] Palpitations   [] Shortness of breath when laying flat   [] Shortness of breath at rest   [x] Shortness of breath with exertion. Vascular:  [] Pain in legs with walking   [] Pain in legs at rest   [] Pain in legs when laying flat   [] Claudication   [] Pain in feet when walking  [] Pain in feet at rest  [] Pain in feet when laying flat   [] History of DVT   [] Phlebitis   [] Swelling in legs   [] Varicose veins   [] Non-healing ulcers Pulmonary:   [] Uses home oxygen   [] Productive cough   [] Hemoptysis   [] Wheeze  [x] COPD   [] Asthma Neurologic:  [x] Dizziness  [x] Blackouts   [] Seizures   [] History of stroke   [x] History of TIA  [] Aphasia   [] Temporary blindness   [] Dysphagia   [] Weakness or numbness in arms   [] Weakness or numbness in legs Musculoskeletal:  [x] Arthritis   [] Joint swelling   [] Joint pain   [] Low back pain Hematologic:  [] Easy bruising  [] Easy bleeding   [] Hypercoagulable state   [] Anemic  [] Hepatitis Gastrointestinal:  [] Blood in stool   [] Vomiting blood  [x] Gastroesophageal reflux/heartburn   [] Difficulty swallowing. Genitourinary:  [] Chronic kidney disease   [] Difficult urination  [] Frequent urination  [] Burning with urination   [] Blood in urine Skin:  [] Rashes   [] Ulcers   [] Wounds Psychological:  [x] History of anxiety   [x]  History of major depression.  Physical Examination  Vitals:   07/07/19 1308  BP: (!) 153/83  Pulse: (!) 104  Resp: 16  Weight: 148 lb 6.4 oz (67.3 kg)   Body mass index is 28.04 kg/m. Gen:  WD/WN, NAD.  Appears older than stated age Head: [place]/AT, No temporalis wasting. Ear/Nose/Throat: Hearing grossly intact, nares w/o erythema or drainage, trachea midline Eyes: Conjunctiva clear. Sclera  non-icteric Neck: Supple.  Right carotid bruit is present Pulmonary:  Good air movement, equal and clear to auscultation bilaterally.  Cardiac: Somewhat tachycardic Vascular:  Vessel Right Left  Radial Palpable Palpable                   Musculoskeletal: M/S 5/5 throughout.  No deformity or atrophy.  No significant lower extremity edema. Neurologic: CN 2-12 intact. Sensation grossly intact in extremities.  Symmetrical.  Speech is fluent. Motor exam as listed above. Psychiatric: Judgment intact, Mood & affect appropriate for pt's clinical situation. Dermatologic: No rashes or ulcers noted.  No cellulitis or open wounds. Lymph : No Cervical, Axillary, or Inguinal lymphadenopathy.     CBC Lab Results  Component Value Date   WBC 7.2 06/16/2019   HGB 11.2 (L) 06/16/2019   HCT 33.9 (L) 06/16/2019   MCV 96.3 06/16/2019   PLT 296 06/16/2019    BMET    Component Value Date/Time   NA 140 06/16/2019 1756   NA 140 07/11/2014 0546   K 3.0 (L) 06/16/2019 1756   K 3.5 07/11/2014 0546   CL 101 06/16/2019 1756   CL 104 07/11/2014 0546   CO2 28 06/16/2019 1756   CO2 29 07/11/2014 0546   GLUCOSE 75 06/16/2019 1756   GLUCOSE 81 07/11/2014 0546   BUN 5 (L) 06/16/2019 1756   BUN 17 07/11/2014 0546   CREATININE 0.88 06/16/2019 1756   CREATININE 0.88 07/11/2014 0546   CALCIUM 8.6 (L) 06/16/2019 1756   CALCIUM 8.5 07/11/2014 0546   GFRNONAA >60 06/16/2019 1756   GFRNONAA >60 07/11/2014 0546   GFRNONAA >60 02/12/2012 1601  GFRAA >60 06/16/2019 1756   GFRAA >60 07/11/2014 0546   GFRAA >60 02/12/2012 1601   Estimated Creatinine Clearance: 54.5 mL/min (by C-G formula based on SCr of 0.88 mg/dL).  COAG Lab Results  Component Value Date   INR 0.98 06/27/2016   INR 1.03 05/13/2015   INR 1.02 04/06/2015    Radiology No results found.    Assessment/Plan Diabetes (HCC) blood glucose control important in reducing the progression of atherosclerotic disease. Also, involved in  wound healing. On appropriate medications.   Essential hypertension blood pressure control important in reducing the progression of atherosclerotic disease. On appropriate oral medications.   Syncope The cause of the syncopal episodes is obviously not entirely clear at this point.  She has not had her carotids checked in some time so would strongly recommend we evaluate this as a possible cause of that and also because of an increased risk of stroke with known carotid disease.  Carotid stenosis The patient has a known history of carotid stenosis that has not been checked in about 3 years.  I recommend we check this with a carotid duplex in the near future at her convenience.  This could certainly be a contributing factor to her syncopal episodes as good vertebrobasilar disease.  We will see her back following her carotid duplex to discuss the results and determine further treatment options.  She will continue on her dual antiplatelet therapy and statin agent.    Leotis Pain, MD  07/07/2019 1:44 PM    This note was created with Dragon medical transcription system.  Any errors from dictation are purely unintentional

## 2019-07-07 NOTE — Assessment & Plan Note (Signed)
The patient has a known history of carotid stenosis that has not been checked in about 3 years.  I recommend we check this with a carotid duplex in the near future at her convenience.  This could certainly be a contributing factor to her syncopal episodes as good vertebrobasilar disease.  We will see her back following her carotid duplex to discuss the results and determine further treatment options.  She will continue on her dual antiplatelet therapy and statin agent.

## 2019-07-07 NOTE — Assessment & Plan Note (Signed)
The cause of the syncopal episodes is obviously not entirely clear at this point.  She has not had her carotids checked in some time so would strongly recommend we evaluate this as a possible cause of that and also because of an increased risk of stroke with known carotid disease.

## 2019-08-05 ENCOUNTER — Encounter (INDEPENDENT_AMBULATORY_CARE_PROVIDER_SITE_OTHER): Payer: Medicare Other

## 2019-08-05 ENCOUNTER — Ambulatory Visit (INDEPENDENT_AMBULATORY_CARE_PROVIDER_SITE_OTHER): Payer: Medicare Other | Admitting: Nurse Practitioner

## 2019-08-22 ENCOUNTER — Emergency Department
Admission: EM | Admit: 2019-08-22 | Discharge: 2019-08-23 | Disposition: A | Discharge status: Home / Self Care | Payer: Medicare Other | Attending: Emergency Medicine | Admitting: Emergency Medicine

## 2019-08-22 ENCOUNTER — Emergency Department: Payer: Medicare Other

## 2019-08-22 ENCOUNTER — Other Ambulatory Visit: Payer: Self-pay

## 2019-08-22 DIAGNOSIS — Z7982 Long term (current) use of aspirin: Secondary | ICD-10-CM | POA: Insufficient documentation

## 2019-08-22 DIAGNOSIS — F17210 Nicotine dependence, cigarettes, uncomplicated: Secondary | ICD-10-CM | POA: Insufficient documentation

## 2019-08-22 DIAGNOSIS — Z8673 Personal history of transient ischemic attack (TIA), and cerebral infarction without residual deficits: Secondary | ICD-10-CM | POA: Insufficient documentation

## 2019-08-22 DIAGNOSIS — E1165 Type 2 diabetes mellitus with hyperglycemia: Secondary | ICD-10-CM

## 2019-08-22 DIAGNOSIS — Z79899 Other long term (current) drug therapy: Secondary | ICD-10-CM | POA: Insufficient documentation

## 2019-08-22 DIAGNOSIS — I10 Essential (primary) hypertension: Secondary | ICD-10-CM

## 2019-08-22 DIAGNOSIS — J449 Chronic obstructive pulmonary disease, unspecified: Secondary | ICD-10-CM | POA: Diagnosis not present

## 2019-08-22 LAB — BASIC METABOLIC PANEL
Anion gap: 10 (ref 5–15)
BUN: 12 mg/dL (ref 8–23)
CO2: 28 mmol/L (ref 22–32)
Calcium: 8.7 mg/dL — ABNORMAL LOW (ref 8.9–10.3)
Chloride: 100 mmol/L (ref 98–111)
Creatinine, Ser: 0.86 mg/dL (ref 0.44–1.00)
GFR calc Af Amer: >60 mL/min (ref >60)
GFR calc non Af Amer: >60 mL/min (ref >60)
Glucose, Bld: 233 mg/dL — ABNORMAL HIGH (ref 70–99)
Potassium: 3.3 mmol/L — ABNORMAL LOW (ref 3.5–5.1)
Sodium: 138 mmol/L (ref 135–145)

## 2019-08-22 LAB — GLUCOSE, CAPILLARY: Glucose-Capillary: 249 mg/dL — ABNORMAL HIGH (ref 70–99)

## 2019-08-22 LAB — CBC
HCT: 40.1 % (ref 36.0–46.0)
Hemoglobin: 13.5 g/dL (ref 12.0–15.0)
MCH: 31.5 pg (ref 26.0–34.0)
MCHC: 33.7 g/dL (ref 30.0–36.0)
MCV: 93.7 fL (ref 80.0–100.0)
Platelets: 247 10*3/uL (ref 150–400)
RBC: 4.28 MIL/uL (ref 3.87–5.11)
RDW: 13.2 % (ref 11.5–15.5)
WBC: 14 10*3/uL — ABNORMAL HIGH (ref 4.0–10.5)
nRBC: 0 % (ref 0.0–0.2)

## 2019-08-22 MED ORDER — AMLODIPINE BESYLATE 5 MG PO TABS: 10.0000 mg | ORAL_TABLET | Freq: Once | ORAL | Status: DC

## 2019-08-22 MED ORDER — LISINOPRIL 10 MG PO TABS: 20.0000 mg | ORAL_TABLET | Freq: Once | ORAL | Status: DC

## 2019-08-22 MED ORDER — HYDROCHLOROTHIAZIDE 25 MG PO TABS: 25.0000 mg | ORAL_TABLET | Freq: Once | ORAL | Status: DC

## 2019-08-22 MED ORDER — AMLODIPINE BESYLATE 5 MG PO TABS
10.0000 mg | ORAL_TABLET | Freq: Once | ORAL | Status: AC
  Administered 2019-08-22: 10 mg via ORAL
  Filled 2019-08-22: qty 2

## 2019-08-22 NOTE — ED Notes (Signed)
Pt states she does not have anyone who could transport her home. This RN attempted to contacted pt emergency contact Tammy Armstrong at [phone]. [place] daughter answered the phone and states they are unable to transport pt home. Pt states she is comfortable taking a cab. Attempting to contact cab company at this time.

## 2019-08-22 NOTE — ED Triage Notes (Signed)
Pt arrives via ems from home. Daughter in [place] called ems for pt reporting high BP. Pt reported taking BP meds at 0500. Taking 300mg  metoprolol but stopped taking it this week due to nausea. HX of diabetes, ems cbg 300. NAD noted at this time. EMS reports daughter told them pt baseline is confused, but pt lives alone.   Ems vitals:  BP 240/90 HR 96 T 99.7 99% room air

## 2019-08-22 NOTE — ED Provider Notes (Signed)
[facility] Emergency Department Provider Note  ____________________________________________  Time seen: Approximately 5:39 PM  I have reviewed the triage vital signs and the nursing notes.   HISTORY  Chief Complaint Hypertension and Hyperglycemia    HPI Tammy Armstrong is a 69 y.o. female who presents the emergency department complaining of high blood pressure, elevated blood glucose reading.  Patient has a history of hypertension, is a diabetic and states that she was talking to her daughter on the phone when she mentioned that her blood pressure and sugar was high.  The patient's daughter called 911 for the patient.  Patient denies any headache, visual changes, chest pain,  abdominal pain, nausea vomiting, diarrhea or constipation.  Patient's daughter called 911 from [place].  Patient currently denies any other complaint other than elevated blood pressure and glucose readings at home.  Patient has a history of anxiety, carotid artery occlusion, COPD, diabetes, GERD, hypercholesterolemia, hypertension, pancreatitis, CVA.        Past Medical History:  Diagnosis Date  . Anxiety   . Arthritis   . Carotid artery occlusion   . Cigarette smoker one half pack a day or less   . COPD (chronic obstructive pulmonary disease) ([place]) 07/04/2011  . Depression   . Diabetes mellitus   . Diabetes mellitus 07/04/2011  . GERD (gastroesophageal reflux disease)   . Hepatitis C   . Hypercholesterolemia   . Hypertension   . Migraines   . Pancreatitis   . 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 Respiratory failure ([place]) 07/04/2011  . Seizure ([place]) 07/02/2011  . Shortness of breath dyspnea   . Stroke [facility])     Patient Active Problem List   Diagnosis Date Noted  . Syncope 05/27/2019  . Nausea & vomiting 05/27/2019  . Essential hypertension 05/27/2019  . AKI (acute kidney injury) ([place])   . PNA (pneumonia) 10/12/2018  . Tobacco use disorder 07/13/2016   . Carotid aneurysm, left ([place]) 07/13/2016  . Right upper extremity numbness   . TIA (transient ischemic attack) 06/27/2016  . Carotid stenosis 04/08/2015  . Migraine 04/06/2015  . Dementia with behavioral disturbance ([place]) 01/19/2015  . Diabetes ([place]) 07/04/2011  . Anxiety 07/04/2011  . COPD (chronic obstructive pulmonary disease) ([place]) 07/04/2011  . Purulent bronchitis ([place]) 07/04/2011  . 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 ABDOMINAL HYSTERECTOMY    . APPENDECTOMY    . BACK SURGERY  2012   cyst removed from spine , [place]  . CHOLECYSTECTOMY    . ENDARTERECTOMY Left 05/18/2015   Procedure: ENDARTERECTOMY CAROTID;  Surgeon: Algernon Huxley, MD;  Location: [facility] ORS;  Service: Vascular;  Laterality: Left;    Prior to Admission medications   Medication Sig Start Date End Date Taking? Authorizing Provider  acetaminophen (TYLENOL) 500 MG tablet Take 1,000 mg by mouth every 8 (eight) hours as needed for pain. 04/25/18   [provider]  albuterol (PROVENTIL HFA;VENTOLIN HFA) 108 (90 BASE) MCG/ACT inhaler Inhale 2 puffs into the lungs every 6 (six) hours as needed for wheezing or shortness of breath.     [provider]  ASPIRIN LOW DOSE 81 MG EC tablet TAKE ONE TABLET BY MOUTH EVERY DAY. *DO NOT CRUSH* Patient taking differently: Take 81 mg by mouth daily.  03/08/17   Algernon Huxley, MD  atorvastatin (LIPITOR) 80 MG tablet Take 80 mg by mouth daily.    [provider]  busPIRone (BUSPAR) 15  MG tablet Take 15 mg by mouth 3 (three) times daily.  07/23/16   [provider]  clonazePAM (KLONOPIN) 1 MG tablet Take 1 mg by mouth 2 (two) times daily.    [provider]  clopidogrel (PLAVIX) 75 MG tablet TAKE ONE TABLET BY MOUTH EVERY DAY WITH BREAKFAST Patient taking differently: Take 75 mg by mouth daily.  11/13/16   Algernon Huxley, MD  diclofenac sodium (VOLTAREN) 1 % GEL Apply 2 g topically 4 (four)  times daily. 01/10/17   [provider]  docusate sodium (COLACE) 100 MG capsule Take 100 mg by mouth daily.     [provider]  DULoxetine (CYMBALTA) 60 MG capsule Take 60 mg by mouth daily.     [provider]  ferrous sulfate (FERROUSUL) 325 (65 FE) MG tablet Take 325 mg by mouth daily with breakfast. 05/11/19 05/10/20  [provider]  fluticasone (FLONASE) 50 MCG/ACT nasal spray Place 2 sprays into both nostrils daily.    [provider]  fluticasone furoate-vilanterol (BREO ELLIPTA) 100-25 MCG/INH AEPB Inhale 1 puff into the lungs daily.    [provider]  gabapentin (NEURONTIN) 600 MG tablet Take 600 mg by mouth 3 (three) times daily. May take an extra dose at night if needed 05/09/18   [provider]  glipiZIDE (GLUCOTROL) 5 MG tablet Take 2.5-5 mg by mouth 2 (two) times daily before a meal. Take one tablet (5mg ) by mouth at breakfast and one-half tablet (2.5mg ) at lunch    [provider]  metoprolol (TOPROL-XL) 50 MG 24 hr tablet Take 50 mg by mouth daily.      [provider]  pantoprazole (PROTONIX) 40 MG tablet Take 40 mg by mouth 2 (two) times daily.    [provider]  QUEtiapine (SEROQUEL) 25 MG tablet Take 25 mg by mouth 3 (three) times daily. Take one tablet (25 mg) by mouth every morning, one tablet (25 mg) at noon and two tablets (50mg ) at bedtime    [provider]  traZODone (DESYREL) 50 MG tablet Take 50 mg by mouth at bedtime. 04/18/19   [provider]  ziprasidone (GEODON) 20 MG capsule Take 20 mg by mouth 2 (two) times daily. 08/23/18   [provider]    Allergies Promethazine, Sumatriptan, Ciprofloxacin, Doxycycline, Imitrex [sumatriptan base], Morphine and related, Penicillins, Ranitidine hcl, Zantac [ranitidine hcl], and Ketorolac tromethamine  Family History  Problem Relation Age of Onset  . Hypertension Mother   . Heart attack Mother   . Diabetes  Mother   . Anxiety disorder Mother   . Stroke Mother   . Diabetes Father   . Diabetes Brother   . Alcohol abuse Other        siblings  . Hypertension Brother   . Breast cancer Neg Hx     Social History Social History   Tobacco Use  . Smoking status: Current Every Day Smoker    Packs/day: 1.00    Years: 52.00    Pack years: 52.00    Types: Cigarettes  . Smokeless tobacco: Never Used  Substance Use Topics  . Alcohol use: No  . Drug use: No     Review of Systems  Constitutional: No fever/chills.  Positive for elevated blood pressure and glucose readings at home. Eyes: No visual changes. No discharge ENT: No upper respiratory complaints. Cardiovascular: no chest pain. Respiratory: no cough. No SOB. Gastrointestinal: No abdominal pain.  No nausea, no vomiting.  No diarrhea.  No  constipation. Genitourinary: Negative for dysuria. No hematuria Musculoskeletal: Negative for musculoskeletal pain. Skin: Negative for rash, abrasions, lacerations, ecchymosis. Neurological: Negative for headaches, focal weakness or numbness. 10-point ROS otherwise negative.  ____________________________________________   PHYSICAL EXAM:  VITAL SIGNS: ED Triage Vitals  Enc Vitals Group     BP 08/22/19 1724 (!) 167/66     Pulse Rate 08/22/19 1724 89     Resp 08/22/19 1730 20     Temp 08/22/19 1724 99.7 F (37.6 C)     Temp Source 08/22/19 1724 Oral     SpO2 08/22/19 1724 98 %     Weight 08/22/19 1726 149 lb (67.6 kg)     Height 08/22/19 1726 5\' 1"  (1.549 m)     Head Circumference --      Peak Flow --      Pain Score 08/22/19 1726 0     Pain Loc --      Pain Edu? --      Excl. in [place]? --      Constitutional: Alert and oriented. Well appearing and in no acute distress. Eyes: Conjunctivae are normal. PERRL. EOMI. Head: Atraumatic. ENT:      Ears:       Nose: No congestion/rhinnorhea.      Mouth/Throat: Mucous membranes are moist.  Neck: No stridor.    Cardiovascular: Normal rate,  regular rhythm. Normal S1 and S2.  Good peripheral circulation. Respiratory: Normal respiratory effort without tachypnea or retractions. Lungs CTAB. Good air entry to the bases with no decreased or absent breath sounds. Gastrointestinal: Bowel sounds 4 quadrants. Soft and nontender to palpation. No guarding or rigidity. No palpable masses. No distention. No CVA tenderness. Musculoskeletal: Full range of motion to all extremities. No gross deformities appreciated. Neurologic:  Normal speech and language. No gross focal neurologic deficits are appreciated.  Skin:  Skin is warm, dry and intact. No rash noted. Psychiatric: Mood and affect are normal. Speech and behavior are normal. Patient exhibits appropriate insight and judgement.   ____________________________________________   LABS (all labs ordered are listed, but only abnormal results are displayed)  Labs Reviewed  CBC - Abnormal; Notable for the following components:      Result Value   WBC 14.0 (*)    All other components within normal limits  GLUCOSE, CAPILLARY - Abnormal; Notable for the following components:   Glucose-Capillary 249 (*)    All other components within normal limits  BASIC METABOLIC PANEL - Abnormal; Notable for the following components:   Potassium 3.3 (*)    Glucose, Bld 233 (*)    Calcium 8.7 (*)    All other components within normal limits  URINALYSIS, COMPLETE (UACMP) WITH MICROSCOPIC  CBG MONITORING, ED  CBG MONITORING, ED   ____________________________________________  EKG   ____________________________________________  RADIOLOGY   DG Chest 1 View  Result Date: 08/22/2019 CLINICAL DATA:  Shortness of breath EXAM: CHEST  1 VIEW COMPARISON:  05/26/2019 FINDINGS: The heart size and mediastinal contours are within normal limits. No focal airspace consolidation, pleural effusion, or pneumothorax. The visualized skeletal structures are unremarkable. IMPRESSION: No active disease. Electronically Signed    By: Davina Poke D.O.   On: 08/22/2019 17:56    ____________________________________________    PROCEDURES  Procedure(s) performed:    Procedures    Medications  amLODipine (NORVASC) tablet 10 mg (10 mg Oral Given 08/22/19 1812)     ____________________________________________   INITIAL IMPRESSION / ASSESSMENT AND PLAN / ED COURSE  Pertinent labs & imaging results  that were available during my care of the patient were reviewed by me and considered in my medical decision making (see chart for details).  Review of the [place] CSRS was performed in accordance of the [place] prior to dispensing any controlled drugs.           Patient's diagnosis is consistent with hypertension, diabetes.  Patient presented to emergency department with complaints of elevated blood glucose and blood pressure readings at home.  Initial readings at home with EMS were blood pressure of 240/90, capillary blood glucose reading of 300.  Patient denies any complaints on arrival in the emergency department.  Exam is benign.  Patient's blood pressure had improved to 160s over 60.  Patient chart was reviewed and reveals that patient has had poorly controlled hypertension due to medication side effects.  Patient's baseline blood pressure readings have been elevated at primary care's office.  At this time it appears that this is likely patient's baseline.  Patient had blood glucose of 249.  Labs with no evidence of DKA.  At this time, patient is instructed to take regular medications at home.  Work-up is reassuring.  Follow-up primary care..  Patient is given ED precautions to return to the ED for any worsening or new symptoms.     ____________________________________________  FINAL CLINICAL IMPRESSION(S) / ED DIAGNOSES  Final diagnoses:  Essential hypertension  Type 2 diabetes mellitus with hyperglycemia, without long-term current use of insulin ([place])      NEW MEDICATIONS STARTED DURING THIS  VISIT:  ED Discharge Orders    None          This chart was dictated using voice recognition software/Dragon. Despite best efforts to proofread, errors can occur which can change the meaning. Any change was purely unintentional.    Darletta Moll, PA-C 08/22/19 1959    Drenda Freeze, MD 08/22/19 [phone]

## 2019-08-23 NOTE — ED Notes (Signed)
Ladona Mow coming to transport patient home. ETA 20 min per [employer].

## 2019-08-30 ENCOUNTER — Encounter: Payer: Self-pay | Admitting: Emergency Medicine

## 2019-08-30 ENCOUNTER — Telehealth: Payer: Self-pay | Admitting: Surgery

## 2019-08-30 ENCOUNTER — Emergency Department: Payer: Medicare Other

## 2019-08-30 ENCOUNTER — Emergency Department
Admission: EM | Admit: 2019-08-30 | Discharge: 2019-08-30 | Disposition: A | Discharge status: Home / Self Care | Payer: Medicare Other | Attending: Emergency Medicine | Admitting: Emergency Medicine

## 2019-08-30 ENCOUNTER — Other Ambulatory Visit: Payer: Self-pay

## 2019-08-30 DIAGNOSIS — I10 Essential (primary) hypertension: Secondary | ICD-10-CM | POA: Diagnosis not present

## 2019-08-30 DIAGNOSIS — Z79899 Other long term (current) drug therapy: Secondary | ICD-10-CM | POA: Insufficient documentation

## 2019-08-30 DIAGNOSIS — Z7984 Long term (current) use of oral hypoglycemic drugs: Secondary | ICD-10-CM | POA: Insufficient documentation

## 2019-08-30 DIAGNOSIS — I82622 Acute embolism and thrombosis of deep veins of left upper extremity: Secondary | ICD-10-CM

## 2019-08-30 DIAGNOSIS — F0391 Unspecified dementia with behavioral disturbance: Secondary | ICD-10-CM | POA: Diagnosis not present

## 2019-08-30 DIAGNOSIS — R2232 Localized swelling, mass and lump, left upper limb: Secondary | ICD-10-CM | POA: Diagnosis present

## 2019-08-30 DIAGNOSIS — F17210 Nicotine dependence, cigarettes, uncomplicated: Secondary | ICD-10-CM | POA: Insufficient documentation

## 2019-08-30 DIAGNOSIS — J449 Chronic obstructive pulmonary disease, unspecified: Secondary | ICD-10-CM | POA: Diagnosis not present

## 2019-08-30 DIAGNOSIS — Z7901 Long term (current) use of anticoagulants: Secondary | ICD-10-CM | POA: Diagnosis not present

## 2019-08-30 DIAGNOSIS — M7989 Other specified soft tissue disorders: Secondary | ICD-10-CM

## 2019-08-30 MED ORDER — RIVAROXABAN 15 MG PO TABS
15.0000 mg | ORAL_TABLET | Freq: Once | ORAL | Status: DC
  Filled 2019-08-30: qty 1

## 2019-08-30 MED ORDER — XARELTO VTE STARTER PACK 15 & 20 MG PO TBPK: ORAL_TABLET | ORAL | 0 refills | Status: DC

## 2019-08-30 NOTE — ED Notes (Signed)
Pt speaking with this RN in NAD, has soft nodule on left forearm, states she had an IV last week when she was here and it has been swollen ever since

## 2019-08-30 NOTE — Telephone Encounter (Signed)
Patient came to ED with pain and swelling in her left arm.  Ultrasound showed a left brachial vein DVT as well as hematoma.  This appears to be related to a IV placement a few weeks ago.  I have recommended 3 months of anticoagulation since this was a provoked DVT.  The patient is already on aspirin and Plavix.  She was last seen by Dr. Lazaro Arms in December 2020.  She has a history of carotid stenosis as well as a stroke.  She probably does not need to be on dual antiplatelet therapy, however I did not recommend changing her existing medications.  She will add Xarelto for 3 months.  She is scheduled for carotid follow-up in the immediate future.  At that time we can address antiplatelet therapy.  Annamarie Major

## 2019-08-30 NOTE — ED Notes (Addendum)
Pt standing in hall at this time, pt inquiring about wait time for discharge paperwork. Advised pt that the PA is on the phone with her doctor at this time to establish the best plan of care for her. Pt reports "I am sorry, I know I am getting on yalls nerves but yall are getting on my damn nerves". As this RN went to get patient's xarelto out of the pyxis, this RN heard pt stating "This is piss poor effort and yall can go screw yourselves". After returning from pyxis with xarelto this RN was informed that pt had left without discharge paperwork, discharge vitals and without prescribed xarelto.

## 2019-08-30 NOTE — ED Provider Notes (Signed)
[facility] Emergency Department Provider Note  ____________________________________________  Time seen: Approximately 12:24 PM  I have reviewed the triage vital signs and the nursing notes.   HISTORY  Chief Complaint No chief complaint on file.    HPI Tammy Armstrong is a 69 y.o. female with PMH of CAD, COPD, diabetes, hypertension, hyperlipidemia, CVA, left carotid stent that is on Plavix and aspirin that presents to the emergency department for evaluation of lump to left arm for 1 week.  Patient states that she has a lump where an IV was inserted last week when she came to the emergency department.  Patient was here in this emergency department 1 week prior with concerns of hypertension.  Area is minimally painful.  It has grown in size.  She has felt a bit warm but has not checked her temperature.  Patient has seen Dr. Lucky Cowboy for her carotid stent.  No headache, shortness of breath, chest pain.   Past Medical History:  Diagnosis Date  . Anxiety   . Arthritis   . Carotid artery occlusion   . Cigarette smoker one half pack a day or less   . COPD (chronic obstructive pulmonary disease) ([place]) 07/04/2011  . Depression   . Diabetes mellitus   . Diabetes mellitus 07/04/2011  . GERD (gastroesophageal reflux disease)   . Hepatitis C   . Hypercholesterolemia   . Hypertension   . Migraines   . Pancreatitis   . 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 Respiratory failure ([place]) 07/04/2011  . Seizure ([place]) 07/02/2011  . Shortness of breath dyspnea   . Stroke [facility])     Patient Active Problem List   Diagnosis Date Noted  . Syncope 05/27/2019  . Nausea & vomiting 05/27/2019  . Essential hypertension 05/27/2019  . AKI (acute kidney injury) ([place])   . PNA (pneumonia) 10/12/2018  . Tobacco use disorder 07/13/2016  . 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 Right upper extremity numbness   . TIA (transient ischemic attack) 06/27/2016  . Carotid stenosis 04/08/2015   . Migraine 04/06/2015  . Dementia with behavioral disturbance ([place]) 01/19/2015  . Diabetes ([place]) 07/04/2011  . Anxiety 07/04/2011  . COPD (chronic obstructive pulmonary disease) ([place]) 07/04/2011  . Purulent bronchitis ([place]) 07/04/2011  . 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 ABDOMINAL HYSTERECTOMY    . APPENDECTOMY    . BACK SURGERY  2012   cyst removed from spine , [place]  . CHOLECYSTECTOMY    . ENDARTERECTOMY Left 05/18/2015   Procedure: ENDARTERECTOMY CAROTID;  Surgeon: Algernon Huxley, MD;  Location: [facility] ORS;  Service: Vascular;  Laterality: Left;    Prior to Admission medications   Medication Sig Start Date End Date Taking? Authorizing Provider  acetaminophen (TYLENOL) 500 MG tablet Take 1,000 mg by mouth every 8 (eight) hours as needed for pain. 04/25/18   [provider]  albuterol (PROVENTIL HFA;VENTOLIN HFA) 108 (90 BASE) MCG/ACT inhaler Inhale 2 puffs into the lungs every 6 (six) hours as needed for wheezing or shortness of breath.     [provider]  ASPIRIN LOW DOSE 81 MG EC tablet TAKE ONE TABLET BY MOUTH EVERY DAY. *DO NOT CRUSH* Patient taking differently: Take 81 mg by mouth daily.  03/08/17   Algernon Huxley, MD  atorvastatin (LIPITOR) 80 MG tablet Take 80 mg by mouth daily.    [provider]  busPIRone (BUSPAR) 15 MG tablet Take 15 mg by  mouth 3 (three) times daily.  07/23/16   [provider]  clonazePAM (KLONOPIN) 1 MG tablet Take 1 mg by mouth 2 (two) times daily.    [provider]  clopidogrel (PLAVIX) 75 MG tablet TAKE ONE TABLET BY MOUTH EVERY DAY WITH BREAKFAST Patient taking differently: Take 75 mg by mouth daily.  11/13/16   Algernon Huxley, MD  diclofenac sodium (VOLTAREN) 1 % GEL Apply 2 g topically 4 (four) times daily. 01/10/17   [provider]  docusate sodium (COLACE) 100 MG capsule Take 100 mg by mouth daily.     [provider]  DULoxetine  (CYMBALTA) 60 MG capsule Take 60 mg by mouth daily.     [provider]  ferrous sulfate (FERROUSUL) 325 (65 FE) MG tablet Take 325 mg by mouth daily with breakfast. 05/11/19 05/10/20  [provider]  fluticasone (FLONASE) 50 MCG/ACT nasal spray Place 2 sprays into both nostrils daily.    [provider]  fluticasone furoate-vilanterol (BREO ELLIPTA) 100-25 MCG/INH AEPB Inhale 1 puff into the lungs daily.    [provider]  gabapentin (NEURONTIN) 600 MG tablet Take 600 mg by mouth 3 (three) times daily. May take an extra dose at night if needed 05/09/18   [provider]  glipiZIDE (GLUCOTROL) 5 MG tablet Take 2.5-5 mg by mouth 2 (two) times daily before a meal. Take one tablet (5mg ) by mouth at breakfast and one-half tablet (2.5mg ) at lunch    [provider]  metoprolol (TOPROL-XL) 50 MG 24 hr tablet Take 50 mg by mouth daily.      [provider]  pantoprazole (PROTONIX) 40 MG tablet Take 40 mg by mouth 2 (two) times daily.    [provider]  QUEtiapine (SEROQUEL) 25 MG tablet Take 25 mg by mouth 3 (three) times daily. Take one tablet (25 mg) by mouth every morning, one tablet (25 mg) at noon and two tablets (50mg ) at bedtime    [provider]  Rivaroxaban (XARELTO STARTER PACK) 15 & 20 MG TBPK Follow package directions: Take one 15mg  tablet by mouth twice a day. On day 22, switch to one 20mg  tablet once a day. Take with food. 08/30/19   Laban Emperor, PA-C  traZODone (DESYREL) 50 MG tablet Take 50 mg by mouth at bedtime. 04/18/19   [provider]  ziprasidone (GEODON) 20 MG capsule Take 20 mg by mouth 2 (two) times daily. 08/23/18   [provider]    Allergies Promethazine, Sumatriptan, Ciprofloxacin, Doxycycline, Imitrex [sumatriptan base], Morphine and related, Penicillins, Ranitidine hcl, Zantac [ranitidine hcl], and Ketorolac tromethamine  Family History  Problem Relation Age of Onset  .  Hypertension Mother   . Heart attack Mother   . Diabetes Mother   . Anxiety disorder Mother   . Stroke Mother   . Diabetes Father   . Diabetes Brother   . Alcohol abuse Other        siblings  . Hypertension Brother   . Breast cancer Neg Hx     Social History Social History   Tobacco Use  . Smoking status: Current Every Day Smoker    Packs/day: 1.00    Years: 52.00    Pack years: 52.00    Types: Cigarettes  . Smokeless tobacco: Never Used  Substance Use Topics  . Alcohol use: No  . Drug use: No     Review of Systems  Constitutional: No fever Gastrointestinal: No nausea, no vomiting.  Musculoskeletal: Negative for  musculoskeletal pain. Skin: Negative for rash, abrasions, lacerations.  Positive for ecchymosis. Neurological: Negative for numbness or tingling   ____________________________________________   PHYSICAL EXAM:  VITAL SIGNS: ED Triage Vitals  Enc Vitals Group     BP 08/30/19 1205 130/65     Pulse Rate 08/30/19 1205 75     Resp 08/30/19 1205 18     Temp 08/30/19 1205 98.7 F (37.1 C)     Temp Source 08/30/19 1205 Oral     SpO2 08/30/19 1205 97 %     Weight 08/30/19 1202 148 lb 15.9 oz (67.6 kg)     Height 08/30/19 1202 5\' 1"  (1.549 m)     Head Circumference --      Peak Flow --      Pain Score 08/30/19 1202 5     Pain Loc --      Pain Edu? --      Excl. in [place]? --      Constitutional: Alert and oriented. Well appearing and in no acute distress. Eyes: Conjunctivae are normal. PERRL. EOMI. Head: Atraumatic. ENT:      Ears:      Nose: No congestion/rhinnorhea.      Mouth/Throat: Mucous membranes are moist.  Neck: No stridor.   Cardiovascular: Normal rate, regular rhythm.  Good peripheral circulation. Symmetric radial pulses. Respiratory: Normal respiratory effort without tachypnea or retractions. Lungs CTAB. Good air entry to the bases with no decreased or absent breath sounds. Musculoskeletal: Full range of motion to all extremities. No gross  deformities appreciated.  Neurologic:  Normal speech and language. No gross focal neurologic deficits are appreciated.  Skin:  Skin is warm, dry and intact. 2 cm x 2 cm nontender soft mass to left forearm.  No overlying erythema.  Some surrounding ecchymosis. Psychiatric: Mood and affect are normal. Speech and behavior are normal. Patient exhibits appropriate insight and judgement.   ____________________________________________   LABS (all labs ordered are listed, but only abnormal results are displayed)  Labs Reviewed - No data to display ____________________________________________  EKG   ____________________________________________  RADIOLOGY   US Venous Img Upper Uni Left  Result Date: 08/30/2019 CLINICAL DATA:  Acute left upper extremity swelling. EXAM: Left UPPER EXTREMITY VENOUS DOPPLER ULTRASOUND TECHNIQUE: Gray-scale sonography with graded compression, as well as color Doppler and duplex ultrasound were performed to evaluate the upper extremity deep venous system from the level of the subclavian vein and including the jugular, axillary, basilic, radial, ulnar and upper cephalic vein. Spectral Doppler was utilized to evaluate flow at rest and with distal augmentation maneuvers. COMPARISON:  None. FINDINGS: Contralateral Subclavian Vein: Respiratory phasicity is normal and symmetric with the symptomatic side. No evidence of thrombus. Normal compressibility. Internal Jugular Vein: No evidence of thrombus. Normal compressibility, respiratory phasicity and response to augmentation. Subclavian Vein: No evidence of thrombus. Normal compressibility, respiratory phasicity and response to augmentation. Axillary Vein: No evidence of thrombus. Normal compressibility, respiratory phasicity and response to augmentation. Cephalic Vein: No evidence of thrombus. Normal compressibility, respiratory phasicity and response to augmentation. Basilic Vein: No evidence of thrombus. Normal compressibility,  respiratory phasicity and response to augmentation. Brachial Veins: Proximal portion of left brachial vein is noncompressible consistent thrombus. Middle and distal portions appear normal. Radial Veins: No evidence of thrombus. Normal compressibility, respiratory phasicity and response to augmentation. Ulnar Veins: No evidence of thrombus. Normal compressibility, respiratory phasicity and response to augmentation. Venous Reflux:  None visualized. Other Findings: 2.3 x 1.8 x 0.7 cm complex abnormality is seen in  the midportion of the left forearm corresponds to palpable abnormality most consistent with old hematoma. IMPRESSION: Thrombosis of the proximal portion of left brachial vein is noted. No other thrombosis is noted in the left upper extremity. 2.3 cm complex abnormality seen in the soft tissues in the midportion of the left forearm most consistent with hematoma. Electronically Signed   By: Marijo Conception M.D.   On: 08/30/2019 13:44    ____________________________________________    PROCEDURES  Procedure(s) performed:    Procedures    Medications  Rivaroxaban (XARELTO) tablet 15 mg (has no administration in time range)     ____________________________________________   INITIAL IMPRESSION / ASSESSMENT AND PLAN / ED COURSE  Pertinent labs & imaging results that were available during my care of the patient were reviewed by me and considered in my medical decision making (see chart for details).  Review of the [place] CSRS was performed in accordance of the [place] prior to dispensing any controlled drugs.   Patient's diagnosis is consistent with DVT.  Vital signs and exam are reassuring.  Ultrasound consistent with thrombus in brachial vein.  Patient has good radial pulses.  No indication of bacterial infection.  Dr. Trula Slade with vascular surgery was consulted.  He reviewed patient's chart and previous note from Dr. Lucky Cowboy.  He recommends that patient be started on Xarelto.  He does not  recommend altering patient's current prescriptions for aspirin or Plavix.  He recommends that patient begin Xarelto starter pack and continue both aspirin and Plavix with outpatient follow-up with primary care and Dr. Lucky Cowboy.    Patient has been at the door, requesting to leave during her entire emergency room encounter.  I explained to patient several times that she needed an ultrasound and we need to wait for the ultrasound results.  Following ultrasound, she continued to request to leave and I explained to her that I was waiting to hear back her ultrasound results.after discussing with patient that she had a DVT in her left arm, she continues to ask to leave.  I discussed with her that I still wanted to discuss her case with vascular specialist.  After speaking with Dr. Adalberto Cole, plan of care was discussed with the patient and she is comfortable with this plan.  I explained to begin Xarelto and continue her current medications.  She understands plan and is agreeable with the plan.  She will begin Xarelto.  She understands the risks of a DVT and that these can be fatal if left untreated.  Patient eloped in the emergency department prior to receiving any medications or her paperwork.  Patient will be discharged home with prescriptions for Xarelto. Patient is to follow up with primary care and vascular surgery as directed. Patient is given ED precautions to return to the ED for any worsening or new symptoms.  Tammy Armstrong was evaluated in Emergency Department on 08/30/2019 for the symptoms described in the history of present illness. She was evaluated in the context of the global COVID-19 pandemic, which necessitated consideration that the patient might be at risk for infection with the SARS-CoV-2 virus that causes COVID-19. Institutional protocols and algorithms that pertain to the evaluation of patients at risk for COVID-19 are in a state of rapid change based on information released by regulatory bodies including  the CDC and federal and state organizations. These policies and algorithms were followed during the patient's care in the ED.   ____________________________________________  FINAL CLINICAL IMPRESSION(S) / ED DIAGNOSES  Final diagnoses:  Arm swelling  Acute deep vein thrombosis (DVT) of brachial vein of left upper extremity (HCC)      NEW MEDICATIONS STARTED DURING THIS VISIT:  ED Discharge Orders         Ordered    Rivaroxaban (XARELTO STARTER PACK) 15 & 20 MG TBPK     08/30/19 1435              This chart was dictated using voice recognition software/Dragon. Despite best efforts to proofread, errors can occur which can change the meaning. Any change was purely unintentional.    Laban Emperor, PA-C 08/30/19 [place], MD 09/03/19 [phone]

## 2019-08-30 NOTE — ED Notes (Signed)
Pt with localized palpable soft lump to L forearm upon assessment. Pt denies worsening pain upon palpation. Pt also with noted bruising to L forearm, pt states where previous IV insertion was. Pt states "I saw that commercial where they said if you have a bump...Marland Kitchen"

## 2019-08-30 NOTE — Discharge Instructions (Addendum)
You have a blood clot in your arm.  Please continue taking all of your regular medications.  Please start Xarelto.  Please call Dr. Bunnie Domino office and your primary care doctors office in the morning for a follow-up appointment.  Please return to the emergency department for any shortness of breath, chest pain, change or worsening of symptoms or any other symptoms concerning to you.Marland Kitchen

## 2019-08-30 NOTE — ED Triage Notes (Signed)
First RN note: Pt presents to ED via [facility] with c/o bump to L arm. Per EMS bump has increased in size since last Saturday. Per EMS pt states bump started after an IV was started on her last Saturday. EMS reports VSS, reports hx of DM and HTN. Upon arrival pt can be heard on cell phone speaking in clear sentences without difficulty.

## 2019-08-31 ENCOUNTER — Telehealth: Payer: Self-pay | Admitting: Emergency Medicine

## 2019-08-31 NOTE — Telephone Encounter (Signed)
Called patient due to eloped prior to instructions.  She has not gotten her xarelto.  I [facility]  She asked if it would be delivered. I told that she needs to call the pharmacy and request delivery.  She says she has f/u appt with her doctor on Wednesday.

## 2019-08-31 NOTE — Telephone Encounter (Signed)
Patient called me back and says the pharmacy did not receive the prescription.  Called the rx to [facility] as it was written.  They will deliver it to her.

## 2019-09-02 DIAGNOSIS — Z86718 Personal history of other venous thrombosis and embolism: Secondary | ICD-10-CM | POA: Insufficient documentation

## 2019-09-16 ENCOUNTER — Encounter (INDEPENDENT_AMBULATORY_CARE_PROVIDER_SITE_OTHER): Payer: Medicare Other

## 2019-09-16 ENCOUNTER — Ambulatory Visit (INDEPENDENT_AMBULATORY_CARE_PROVIDER_SITE_OTHER): Payer: Medicare Other | Admitting: Nurse Practitioner

## 2019-09-17 ENCOUNTER — Ambulatory Visit: Payer: Medicare Other | Attending: Internal Medicine

## 2019-09-17 DIAGNOSIS — Z23 Encounter for immunization: Secondary | ICD-10-CM

## 2019-09-17 NOTE — Progress Notes (Signed)
   Covid-19 Vaccination Clinic  Name:  Tammy Armstrong    MRN: [id number] DOB: 08-10-1950  09/17/2019  [name] was observed post Covid-19 immunization for 15 minutes without incidence. She was provided with Vaccine Information Sheet and instruction to access the V-Safe system.   [name] was instructed to call 911 with any severe reactions post vaccine: Marland Kitchen Difficulty breathing  . Swelling of your face and throat  . A fast heartbeat  . A bad rash all over your body  . Dizziness and weakness    Immunizations Administered    Name Date Dose VIS Date Route   Pfizer COVID-19 Vaccine 09/17/2019 11:39 AM 0.3 mL 07/03/2019 Intramuscular   Manufacturer: [place]   Lot: [id number]   [place]: [id number]

## 2019-09-18 ENCOUNTER — Ambulatory Visit: Payer: Medicare Other

## 2019-10-13 ENCOUNTER — Ambulatory Visit: Payer: Medicare Other | Attending: Internal Medicine

## 2019-10-13 ENCOUNTER — Ambulatory Visit: Payer: Medicare Other

## 2019-10-13 DIAGNOSIS — Z23 Encounter for immunization: Secondary | ICD-10-CM

## 2019-10-13 NOTE — Progress Notes (Signed)
   Covid-19 Vaccination Clinic  Name:  Tammy Armstrong    MRN: [id number] DOB: 1950-07-25  10/13/2019  Ms. Taper was observed post Covid-19 immunization for 15 minutes without incident. She was provided with Vaccine Information Sheet and instruction to access the V-Safe system.   [name] was instructed to call 911 with any severe reactions post vaccine: Marland Kitchen Difficulty breathing  . Swelling of face and throat  . A fast heartbeat  . A bad rash all over body  . Dizziness and weakness   Immunizations Administered    Name Date Dose VIS Date Route   Pfizer COVID-19 Vaccine 10/13/2019 11:03 AM 0.3 mL 07/03/2019 Intramuscular   Manufacturer: [employer]   Lot: [id number]   [place]: [id number]

## 2019-10-26 ENCOUNTER — Other Ambulatory Visit: Payer: Self-pay

## 2019-10-26 DIAGNOSIS — F17210 Nicotine dependence, cigarettes, uncomplicated: Secondary | ICD-10-CM | POA: Diagnosis present

## 2019-10-26 DIAGNOSIS — K5909 Other constipation: Secondary | ICD-10-CM | POA: Diagnosis present

## 2019-10-26 DIAGNOSIS — Z7901 Long term (current) use of anticoagulants: Secondary | ICD-10-CM

## 2019-10-26 DIAGNOSIS — F329 Major depressive disorder, single episode, unspecified: Secondary | ICD-10-CM | POA: Diagnosis present

## 2019-10-26 DIAGNOSIS — Z23 Encounter for immunization: Secondary | ICD-10-CM

## 2019-10-26 DIAGNOSIS — E7800 Pure hypercholesterolemia, unspecified: Secondary | ICD-10-CM | POA: Diagnosis present

## 2019-10-26 DIAGNOSIS — Z791 Long term (current) use of non-steroidal anti-inflammatories (NSAID): Secondary | ICD-10-CM

## 2019-10-26 DIAGNOSIS — E119 Type 2 diabetes mellitus without complications: Secondary | ICD-10-CM | POA: Diagnosis present

## 2019-10-26 DIAGNOSIS — D62 Acute posthemorrhagic anemia: Secondary | ICD-10-CM | POA: Diagnosis present

## 2019-10-26 DIAGNOSIS — Z79899 Other long term (current) drug therapy: Secondary | ICD-10-CM

## 2019-10-26 DIAGNOSIS — Z7951 Long term (current) use of inhaled steroids: Secondary | ICD-10-CM

## 2019-10-26 DIAGNOSIS — K219 Gastro-esophageal reflux disease without esophagitis: Secondary | ICD-10-CM | POA: Diagnosis present

## 2019-10-26 DIAGNOSIS — Z7982 Long term (current) use of aspirin: Secondary | ICD-10-CM

## 2019-10-26 DIAGNOSIS — Z7902 Long term (current) use of antithrombotics/antiplatelets: Secondary | ICD-10-CM

## 2019-10-26 DIAGNOSIS — F419 Anxiety disorder, unspecified: Secondary | ICD-10-CM | POA: Diagnosis present

## 2019-10-26 DIAGNOSIS — Z86718 Personal history of other venous thrombosis and embolism: Secondary | ICD-10-CM

## 2019-10-26 DIAGNOSIS — I10 Essential (primary) hypertension: Secondary | ICD-10-CM | POA: Diagnosis present

## 2019-10-26 DIAGNOSIS — G4700 Insomnia, unspecified: Secondary | ICD-10-CM | POA: Diagnosis present

## 2019-10-26 DIAGNOSIS — J449 Chronic obstructive pulmonary disease, unspecified: Secondary | ICD-10-CM | POA: Diagnosis present

## 2019-10-26 DIAGNOSIS — Z20822 Contact with and (suspected) exposure to covid-19: Secondary | ICD-10-CM | POA: Diagnosis present

## 2019-10-26 DIAGNOSIS — Z90710 Acquired absence of both cervix and uterus: Secondary | ICD-10-CM

## 2019-10-26 DIAGNOSIS — K921 Melena: Secondary | ICD-10-CM | POA: Diagnosis not present

## 2019-10-26 DIAGNOSIS — Z8673 Personal history of transient ischemic attack (TIA), and cerebral infarction without residual deficits: Secondary | ICD-10-CM

## 2019-10-26 DIAGNOSIS — Z7984 Long term (current) use of oral hypoglycemic drugs: Secondary | ICD-10-CM

## 2019-10-26 LAB — CBC WITH DIFFERENTIAL/PLATELET
Abs Immature Granulocytes: 0.04 10*3/uL (ref 0.00–0.07)
Basophils Absolute: 0.1 10*3/uL (ref 0.0–0.1)
Basophils Relative: 1 %
Eosinophils Absolute: 0.3 10*3/uL (ref 0.0–0.5)
Eosinophils Relative: 3 %
HCT: 19 % — ABNORMAL LOW (ref 36.0–46.0)
Hemoglobin: 5.3 g/dL — ABNORMAL LOW (ref 12.0–15.0)
Immature Granulocytes: 0 %
Lymphocytes Relative: 31 %
Lymphs Abs: 3 10*3/uL (ref 0.7–4.0)
MCH: 26.6 pg (ref 26.0–34.0)
MCHC: 27.9 g/dL — ABNORMAL LOW (ref 30.0–36.0)
MCV: 95.5 fL (ref 80.0–100.0)
Monocytes Absolute: 0.7 10*3/uL (ref 0.1–1.0)
Monocytes Relative: 7 %
Neutro Abs: 5.7 10*3/uL (ref 1.7–7.7)
Neutrophils Relative %: 58 %
Platelets: 367 10*3/uL (ref 150–400)
RBC: 1.99 MIL/uL — ABNORMAL LOW (ref 3.87–5.11)
RDW: 14.8 % (ref 11.5–15.5)
WBC: 9.8 10*3/uL (ref 4.0–10.5)
nRBC: 0 % (ref 0.0–0.2)

## 2019-10-26 LAB — BASIC METABOLIC PANEL
Anion gap: 9 (ref 5–15)
BUN: 11 mg/dL (ref 8–23)
CO2: 21 mmol/L — ABNORMAL LOW (ref 22–32)
Calcium: 8.5 mg/dL — ABNORMAL LOW (ref 8.9–10.3)
Chloride: 108 mmol/L (ref 98–111)
Creatinine, Ser: 0.9 mg/dL (ref 0.44–1.00)
GFR calc Af Amer: >60 mL/min (ref >60)
GFR calc non Af Amer: >60 mL/min (ref >60)
Glucose, Bld: 146 mg/dL — ABNORMAL HIGH (ref 70–99)
Potassium: 3.5 mmol/L (ref 3.5–5.1)
Sodium: 138 mmol/L (ref 135–145)

## 2019-10-26 NOTE — ED Triage Notes (Addendum)
Pt to ED via EMS from home. Pt state she went in for routine labs today at PCP and was called this evening staying her hgb was 5.6, pt states she has noticed blood in her stool.Pt also c/o dizziness. Pt states she takes a blood thinner

## 2019-10-27 ENCOUNTER — Emergency Department: Payer: Medicare Other

## 2019-10-27 ENCOUNTER — Inpatient Hospital Stay
Admission: EM | Admit: 2019-10-27 | Discharge: 2019-10-29 | DRG: 378 | Disposition: A | Discharge status: Home Health | Payer: Medicare Other | Attending: Family Medicine | Admitting: Family Medicine

## 2019-10-27 ENCOUNTER — Encounter: Payer: Self-pay | Admitting: Radiology

## 2019-10-27 DIAGNOSIS — K921 Melena: Secondary | ICD-10-CM | POA: Diagnosis present

## 2019-10-27 DIAGNOSIS — F419 Anxiety disorder, unspecified: Secondary | ICD-10-CM | POA: Diagnosis present

## 2019-10-27 DIAGNOSIS — Z90710 Acquired absence of both cervix and uterus: Secondary | ICD-10-CM | POA: Diagnosis not present

## 2019-10-27 DIAGNOSIS — D62 Acute posthemorrhagic anemia: Secondary | ICD-10-CM

## 2019-10-27 DIAGNOSIS — Z7984 Long term (current) use of oral hypoglycemic drugs: Secondary | ICD-10-CM | POA: Diagnosis not present

## 2019-10-27 DIAGNOSIS — Z7902 Long term (current) use of antithrombotics/antiplatelets: Secondary | ICD-10-CM | POA: Diagnosis not present

## 2019-10-27 DIAGNOSIS — F17210 Nicotine dependence, cigarettes, uncomplicated: Secondary | ICD-10-CM | POA: Diagnosis present

## 2019-10-27 DIAGNOSIS — Z79899 Other long term (current) drug therapy: Secondary | ICD-10-CM | POA: Diagnosis not present

## 2019-10-27 DIAGNOSIS — I10 Essential (primary) hypertension: Secondary | ICD-10-CM | POA: Diagnosis present

## 2019-10-27 DIAGNOSIS — E7800 Pure hypercholesterolemia, unspecified: Secondary | ICD-10-CM | POA: Diagnosis present

## 2019-10-27 DIAGNOSIS — K219 Gastro-esophageal reflux disease without esophagitis: Secondary | ICD-10-CM | POA: Diagnosis present

## 2019-10-27 DIAGNOSIS — K922 Gastrointestinal hemorrhage, unspecified: Secondary | ICD-10-CM

## 2019-10-27 DIAGNOSIS — Z23 Encounter for immunization: Secondary | ICD-10-CM | POA: Diagnosis present

## 2019-10-27 DIAGNOSIS — F329 Major depressive disorder, single episode, unspecified: Secondary | ICD-10-CM | POA: Diagnosis present

## 2019-10-27 DIAGNOSIS — G4700 Insomnia, unspecified: Secondary | ICD-10-CM | POA: Diagnosis present

## 2019-10-27 DIAGNOSIS — Z7901 Long term (current) use of anticoagulants: Secondary | ICD-10-CM | POA: Diagnosis not present

## 2019-10-27 DIAGNOSIS — E119 Type 2 diabetes mellitus without complications: Secondary | ICD-10-CM | POA: Diagnosis present

## 2019-10-27 DIAGNOSIS — Z20822 Contact with and (suspected) exposure to covid-19: Secondary | ICD-10-CM | POA: Diagnosis present

## 2019-10-27 DIAGNOSIS — Z8673 Personal history of transient ischemic attack (TIA), and cerebral infarction without residual deficits: Secondary | ICD-10-CM | POA: Diagnosis not present

## 2019-10-27 DIAGNOSIS — D649 Anemia, unspecified: Secondary | ICD-10-CM

## 2019-10-27 DIAGNOSIS — Z7951 Long term (current) use of inhaled steroids: Secondary | ICD-10-CM | POA: Diagnosis not present

## 2019-10-27 DIAGNOSIS — Z791 Long term (current) use of non-steroidal anti-inflammatories (NSAID): Secondary | ICD-10-CM | POA: Diagnosis not present

## 2019-10-27 DIAGNOSIS — K5909 Other constipation: Secondary | ICD-10-CM | POA: Diagnosis present

## 2019-10-27 DIAGNOSIS — D500 Iron deficiency anemia secondary to blood loss (chronic): Secondary | ICD-10-CM

## 2019-10-27 DIAGNOSIS — J449 Chronic obstructive pulmonary disease, unspecified: Secondary | ICD-10-CM | POA: Diagnosis present

## 2019-10-27 DIAGNOSIS — Z7982 Long term (current) use of aspirin: Secondary | ICD-10-CM | POA: Diagnosis not present

## 2019-10-27 DIAGNOSIS — Z86718 Personal history of other venous thrombosis and embolism: Secondary | ICD-10-CM | POA: Diagnosis not present

## 2019-10-27 LAB — CBC WITH DIFFERENTIAL/PLATELET
Abs Immature Granulocytes: 0.03 10*3/uL (ref 0.00–0.07)
Basophils Absolute: 0 10*3/uL (ref 0.0–0.1)
Basophils Relative: 1 %
Eosinophils Absolute: 0.2 10*3/uL (ref 0.0–0.5)
Eosinophils Relative: 3 %
HCT: 22.3 % — ABNORMAL LOW (ref 36.0–46.0)
Hemoglobin: 6.7 g/dL — ABNORMAL LOW (ref 12.0–15.0)
Immature Granulocytes: 0 %
Lymphocytes Relative: 26 %
Lymphs Abs: 2.1 10*3/uL (ref 0.7–4.0)
MCH: 27.8 pg (ref 26.0–34.0)
MCHC: 30 g/dL (ref 30.0–36.0)
MCV: 92.5 fL (ref 80.0–100.0)
Monocytes Absolute: 0.7 10*3/uL (ref 0.1–1.0)
Monocytes Relative: 9 %
Neutro Abs: 5.1 10*3/uL (ref 1.7–7.7)
Neutrophils Relative %: 61 %
Platelets: 308 10*3/uL (ref 150–400)
RBC: 2.41 MIL/uL — ABNORMAL LOW (ref 3.87–5.11)
RDW: 14 % (ref 11.5–15.5)
WBC: 8.3 10*3/uL (ref 4.0–10.5)
nRBC: 0 % (ref 0.0–0.2)

## 2019-10-27 LAB — CBC
HCT: 28.2 % — ABNORMAL LOW (ref 36.0–46.0)
Hemoglobin: 8.9 g/dL — ABNORMAL LOW (ref 12.0–15.0)
MCH: 28 pg (ref 26.0–34.0)
MCHC: 31.6 g/dL (ref 30.0–36.0)
MCV: 88.7 fL (ref 80.0–100.0)
Platelets: 309 10*3/uL (ref 150–400)
RBC: 3.18 MIL/uL — ABNORMAL LOW (ref 3.87–5.11)
RDW: 14.4 % (ref 11.5–15.5)
WBC: 7.3 10*3/uL (ref 4.0–10.5)
nRBC: 0 % (ref 0.0–0.2)

## 2019-10-27 LAB — PROTIME-INR
INR: 1.1 (ref 0.8–1.2)
Prothrombin Time: 13.7 seconds (ref 11.4–15.2)

## 2019-10-27 LAB — GLUCOSE, CAPILLARY
Glucose-Capillary: 150 mg/dL — ABNORMAL HIGH (ref 70–99)
Glucose-Capillary: 181 mg/dL — ABNORMAL HIGH (ref 70–99)
Glucose-Capillary: 159 mg/dL — ABNORMAL HIGH (ref 70–99)
Glucose-Capillary: 320 mg/dL — ABNORMAL HIGH (ref 70–99)
Glucose-Capillary: 294 mg/dL — ABNORMAL HIGH (ref 70–99)

## 2019-10-27 LAB — SARS CORONAVIRUS 2 (TAT 6-24 HRS): SARS Coronavirus 2: NEGATIVE

## 2019-10-27 LAB — PREPARE RBC (CROSSMATCH)

## 2019-10-27 LAB — HEMOGLOBIN A1C
Hgb A1c MFr Bld: 5.6 % (ref 4.8–5.6)
Mean Plasma Glucose: 114.02 mg/dL

## 2019-10-27 MED ORDER — TRAZODONE HCL 50 MG PO TABS
50.0000 mg | ORAL_TABLET | Freq: Every day | ORAL | Status: DC
  Administered 2019-10-27 – 2019-10-28 (×2): 50 mg via ORAL
  Filled 2019-10-27 (×2): qty 1

## 2019-10-27 MED ORDER — ONDANSETRON HCL 4 MG/2ML IJ SOLN: 4.0000 mg | Freq: Four times a day (QID) | INTRAMUSCULAR | Status: DC | PRN

## 2019-10-27 MED ORDER — QUETIAPINE FUMARATE 25 MG PO TABS
25.0000 mg | ORAL_TABLET | Freq: Three times a day (TID) | ORAL | Status: DC
  Administered 2019-10-27 – 2019-10-29 (×6): 25 mg via ORAL
  Filled 2019-10-27 (×6): qty 1

## 2019-10-27 MED ORDER — HYDROCODONE-ACETAMINOPHEN 5-325 MG PO TABS: 1.0000 | ORAL_TABLET | ORAL | Status: DC | PRN

## 2019-10-27 MED ORDER — IOHEXOL 350 MG/ML SOLN
100.0000 mL | Freq: Once | INTRAVENOUS | Status: AC | PRN
  Administered 2019-10-27: 100 mL via INTRAVENOUS

## 2019-10-27 MED ORDER — PANTOPRAZOLE SODIUM 40 MG PO TBEC
40.0000 mg | DELAYED_RELEASE_TABLET | Freq: Every day | ORAL | Status: DC
  Administered 2019-10-27 – 2019-10-29 (×3): 40 mg via ORAL
  Filled 2019-10-27 (×3): qty 1

## 2019-10-27 MED ORDER — AMLODIPINE BESYLATE 10 MG PO TABS
10.0000 mg | ORAL_TABLET | Freq: Every day | ORAL | Status: DC
  Administered 2019-10-27 – 2019-10-29 (×3): 10 mg via ORAL
  Filled 2019-10-27: qty 1
  Filled 2019-10-27: qty 2
  Filled 2019-10-27: qty 1

## 2019-10-27 MED ORDER — BUSPIRONE HCL 15 MG PO TABS
15.0000 mg | ORAL_TABLET | Freq: Three times a day (TID) | ORAL | Status: DC
  Administered 2019-10-27 – 2019-10-29 (×6): 15 mg via ORAL
  Filled 2019-10-27 (×6): qty 1
  Filled 2019-10-27: qty 3
  Filled 2019-10-27: qty 1

## 2019-10-27 MED ORDER — DULOXETINE HCL 30 MG PO CPEP
60.0000 mg | ORAL_CAPSULE | Freq: Every day | ORAL | Status: DC
  Administered 2019-10-27 – 2019-10-29 (×3): 60 mg via ORAL
  Filled 2019-10-27 (×2): qty 2
  Filled 2019-10-27: qty 1

## 2019-10-27 MED ORDER — DOCUSATE SODIUM 100 MG PO CAPS
100.0000 mg | ORAL_CAPSULE | Freq: Two times a day (BID) | ORAL | Status: DC
  Administered 2019-10-28 – 2019-10-29 (×3): 100 mg via ORAL
  Filled 2019-10-27 (×3): qty 1

## 2019-10-27 MED ORDER — QUETIAPINE FUMARATE 25 MG PO TABS
25.0000 mg | ORAL_TABLET | Freq: Every day | ORAL | Status: DC
  Administered 2019-10-27 – 2019-10-28 (×2): 25 mg via ORAL
  Filled 2019-10-27 (×2): qty 1

## 2019-10-27 MED ORDER — ACETAMINOPHEN 325 MG PO TABS
650.0000 mg | ORAL_TABLET | Freq: Four times a day (QID) | ORAL | Status: DC | PRN
  Administered 2019-10-27 – 2019-10-29 (×3): 650 mg via ORAL
  Filled 2019-10-27 (×3): qty 2

## 2019-10-27 MED ORDER — BISACODYL 5 MG PO TBEC: 5.0000 mg | DELAYED_RELEASE_TABLET | Freq: Every day | ORAL | Status: DC | PRN

## 2019-10-27 MED ORDER — PNEUMOCOCCAL VAC POLYVALENT 25 MCG/0.5ML IJ INJ
0.5000 mL | INJECTION | INTRAMUSCULAR | Status: AC
  Administered 2019-10-29: 0.5 mL via INTRAMUSCULAR
  Filled 2019-10-27: qty 0.5

## 2019-10-27 MED ORDER — ZIPRASIDONE HCL 20 MG PO CAPS
20.0000 mg | ORAL_CAPSULE | Freq: Two times a day (BID) | ORAL | Status: DC
  Administered 2019-10-27 – 2019-10-29 (×5): 20 mg via ORAL
  Filled 2019-10-27 (×7): qty 1

## 2019-10-27 MED ORDER — ACETAMINOPHEN 650 MG RE SUPP: 650.0000 mg | Freq: Four times a day (QID) | RECTAL | Status: DC | PRN

## 2019-10-27 MED ORDER — INSULIN ASPART 100 UNIT/ML ~~LOC~~ SOLN
0.0000 [IU] | Freq: Three times a day (TID) | SUBCUTANEOUS | Status: DC
  Administered 2019-10-27 (×2): 3 [IU] via SUBCUTANEOUS
  Administered 2019-10-27 – 2019-10-28 (×2): 2 [IU] via SUBCUTANEOUS
  Administered 2019-10-28 – 2019-10-29 (×4): 3 [IU] via SUBCUTANEOUS
  Filled 2019-10-27 (×8): qty 1

## 2019-10-27 MED ORDER — SODIUM CHLORIDE 0.9 % IV SOLN: Freq: Once | INTRAVENOUS | Status: DC

## 2019-10-27 MED ORDER — ATORVASTATIN CALCIUM 20 MG PO TABS
80.0000 mg | ORAL_TABLET | Freq: Every day | ORAL | Status: DC
  Administered 2019-10-27 – 2019-10-29 (×3): 80 mg via ORAL
  Filled 2019-10-27 (×3): qty 4

## 2019-10-27 MED ORDER — CLONAZEPAM 1 MG PO TABS: 1.0000 mg | ORAL_TABLET | Freq: Every day | ORAL | Status: DC | PRN

## 2019-10-27 MED ORDER — SODIUM CHLORIDE 0.9 % IV SOLN
10.0000 mL/h | Freq: Once | INTRAVENOUS | Status: AC
  Administered 2019-10-27: 10 mL/h via INTRAVENOUS

## 2019-10-27 MED ORDER — ONDANSETRON HCL 4 MG PO TABS: 4.0000 mg | ORAL_TABLET | Freq: Four times a day (QID) | ORAL | Status: DC | PRN

## 2019-10-27 NOTE — ED Notes (Signed)
BLOOD CONSENT WAS SIGNED ON A HARD COPY DUE TO COMPUTER SIGNATURE PAD MALFUNCTION. COPY WAS PLACED FOR MEDICAL RECORDS

## 2019-10-27 NOTE — Progress Notes (Signed)
PROGRESS NOTE    Tammy Armstrong  [id number] DOB: [birth year] DOA: 10/27/2019 PCP: Gayland Curry, MD    Assessment & Plan:   Principal Problem:   Lower GI bleed Active Problems:   Diabetes ([place])   COPD (chronic obstructive pulmonary disease) ([place])   Essential hypertension   Symptomatic anemia   Acute blood loss anemia    Tammy Armstrong is a 69 y.o. Caucasian female with medical history significant for hypertension, diabetes, COPD hepatitis C and depression who presented to the emergency room after her PCP: Said her hemoglobin was 5.6.  Patient states that for the past 2 weeks she has been having intermittent bright red blood per rectum.  She started having shortness of breath and dizziness a couple days prior.  Denies chest pains or palpitations   Lower GI bleed   Symptomatic anemia   Acute blood loss anemia -s/p 2 units packed red blood cells  -GI consult today    Diabetes (HCC) -Insulin sliding scale coverage    COPD (chronic obstructive pulmonary disease) (HCC) -Not acutely exacerbated -Continue home meds    Essential hypertension -Continue home amlodipine  Hx of seizure Anxiety and Depression Insomnia  --continue home meds   DVT prophylaxis: SCD/Compression stockings Code Status: Full code  Family Communication:  Disposition Plan: Home after GI eval, likely in 2 days   Subjective and Interval History:  Pt received 2u pRBC and felt much better afterwards.  No abdominal pain.  No diarrhea.  Chronically constipated.  No fever, dyspnea, chest pain, N/V.   Objective: Vitals:   10/27/19 1104 10/27/19 1612 10/27/19 1647 10/27/19 1801  BP: (!) 147/52 (!) 162/66 (!) 134/55 (!) 152/60  Pulse: 85 84 86 78  Resp: 18 (!) 22 (!) 26 20  Temp:  99.3 F (37.4 C)  98.9 F (37.2 C)  TempSrc:  Oral  Oral  SpO2: 100% 98% 100% 100%  Weight:      Height:        Intake/Output Summary (Last 24 hours) at 10/27/2019 1954 Last data filed at 10/27/2019 0543 Gross per 24  hour  Intake 890 ml  Output --  Net 890 ml   Filed Weights   10/26/19 2131  Weight: 67.6 kg    Examination:   Constitutional: NAD, AAOx3 HEENT: conjunctivae and lids normal, EOMI CV: RRR no M,R,G. Distal pulses +2.  No cyanosis.   RESP: CTA B/L, normal respiratory effort  GI: +BS, NTND Extremities: No effusions, edema, or tenderness in BLE SKIN: warm, dry and intact Neuro: II - XII grossly intact.  Sensation intact Psych: Normal mood and affect.  Appropriate judgement and reason   Data Reviewed: I have personally reviewed following labs and imaging studies  CBC: Recent Labs  Lab 10/26/19 2138 10/27/19 0612 10/27/19 0937  WBC 9.8 8.3 7.3  NEUTROABS 5.7 5.1  --   HGB 5.3* 6.7* 8.9*  HCT 19.0* 22.3* 28.2*  MCV 95.5 92.5 88.7  PLT 367 308 [id number]   Basic Metabolic Panel: Recent Labs  Lab 10/26/19 2138  NA 138  K 3.5  CL 108  CO2 21*  GLUCOSE 146*  BUN 11  CREATININE 0.90  CALCIUM 8.5*   GFR: Estimated Creatinine Clearance: 53.3 mL/min (by C-G formula based on SCr of 0.9 mg/dL). Liver Function Tests: No results for input(s): AST, ALT, ALKPHOS, BILITOT, PROT, ALBUMIN in the last 168 hours. No results for input(s): LIPASE, AMYLASE in the last 168 hours. No results for input(s): AMMONIA in the last 168 hours.  Coagulation Profile: Recent Labs  Lab 10/27/19 0205  INR 1.1   Cardiac Enzymes: No results for input(s): CKTOTAL, CKMB, CKMBINDEX, TROPONINI in the last 168 hours. BNP (last 3 results) No results for input(s): PROBNP in the last 8760 hours. HbA1C: Recent Labs    10/26/19 2138  HGBA1C 5.6   CBG: Recent Labs  Lab 10/27/19 0809 10/27/19 1149 10/27/19 1630  GLUCAP 150* 181* 159*   Lipid Profile: No results for input(s): CHOL, HDL, LDLCALC, TRIG, CHOLHDL, LDLDIRECT in the last 72 hours. Thyroid Function Tests: No results for input(s): TSH, T4TOTAL, FREET4, T3FREE, THYROIDAB in the last 72 hours. Anemia Panel: No results for input(s):  VITAMINB12, FOLATE, FERRITIN, TIBC, IRON, RETICCTPCT in the last 72 hours. Sepsis Labs: No results for input(s): PROCALCITON, LATICACIDVEN in the last 168 hours.  Recent Results (from the past 240 hour(s))  SARS CORONAVIRUS 2 (TAT 6-24 HRS) Nasopharyngeal Nasopharyngeal Swab     Status: None   Collection Time: 10/27/19  9:36 AM   Specimen: Nasopharyngeal Swab  Result Value Ref Range Status   SARS Coronavirus 2 NEGATIVE NEGATIVE Final    Comment: (NOTE) SARS-CoV-2 target nucleic acids are NOT DETECTED. The SARS-CoV-2 RNA is generally detectable in upper and lower respiratory specimens during the acute phase of infection. Negative results do not preclude SARS-CoV-2 infection, do not rule out co-infections with other pathogens, and should not be used as the sole basis for treatment or other patient management decisions. Negative results must be combined with clinical observations, patient history, and epidemiological information. The expected result is Negative. Fact Sheet for Patients: SugarRoll.be Fact Sheet for Healthcare Providers: https://www.woods-mathews.com/ This test is not yet approved or cleared by the [place] FDA and  has been authorized for detection and/or diagnosis of SARS-CoV-2 by FDA under an Emergency Use Authorization (EUA). This EUA will remain  in effect (meaning this test can be used) for the duration of the COVID-19 declaration under Section 56 4(b)(1) of the Act, 21 U.S.C. section 360bbb-3(b)(1), unless the authorization is terminated or revoked sooner. Performed at [facility], [place] [address] [place], [place] [address]       Radiology Studies: CT Angio Abd/Pel W and/or Wo Contrast  Result Date: 10/27/2019 CLINICAL DATA:  GI bleed. EXAM: CTA ABDOMEN AND PELVIS WITHOUT AND WITH CONTRAST TECHNIQUE: Multidetector CT imaging of the abdomen and pelvis was performed using the standard protocol during bolus  administration of intravenous contrast. Multiplanar reconstructed images and MIPs were obtained and reviewed to evaluate the vascular anatomy. CONTRAST:  180mL OMNIPAQUE IOHEXOL 350 MG/ML SOLN COMPARISON:  CT of the pelvis dated May 27, 2019 FINDINGS: VASCULAR Aorta: There are atherosclerotic changes throughout the abdominal aorta without evidence for an aneurysm. Celiac: There are atherosclerotic changes at the origin of the celiac axis resulting in mild stenosis. SMA: There is a high-grade stenosis of the proximal SMA. The mid distal SMA are patent. Renals: There is a single right renal artery with moderate atherosclerotic changes at the origin. There is a single left renal artery with a moderate to high-grade stenosis at the origin. IMA: Patent without evidence of aneurysm, dissection, vasculitis or significant stenosis. Inflow: There is moderate narrowing of the bilateral common iliac arteries proximally. There are atherosclerotic changes throughout the bilateral external iliac arteries. Proximal Outflow: Bilateral common femoral and visualized portions of the superficial and profunda femoral arteries are patent without evidence of aneurysm, dissection, vasculitis or significant stenosis. Veins: No obvious venous abnormality within the limitations of this arterial phase  study. Review of the MIP images confirms the above findings. NON-VASCULAR Lower chest: The lung bases are clear. The heart size is normal. Hepatobiliary: The liver surface appears somewhat nodular, raising concern for underlying cirrhosis. Status post cholecystectomy.There is no biliary ductal dilation. Pancreas: Normal contours without ductal dilatation. No peripancreatic fluid collection. Spleen: Unremarkable. Adrenals/Urinary Tract: --Adrenal glands: Unremarkable. --Right kidney/ureter: No hydronephrosis or radiopaque kidney stones. --Left kidney/ureter: Left kidney is slightly atrophic when compared to right. This is felt to be secondary  to vascular compromise at the origin of the left renal artery. --Urinary bladder: The urinary bladder is decompressed which limits evaluation. Stomach/Bowel: --Stomach/Duodenum: The stomach is moderately distended. --Small bowel: Unremarkable. --Colon: There are few scattered colonic diverticula without CT evidence for diverticulitis. --Appendix: Not visualized. No right lower quadrant inflammation or free fluid. Lymphatic: --No retroperitoneal lymphadenopathy. --No mesenteric lymphadenopathy. --No pelvic or inguinal lymphadenopathy. Reproductive: Status post hysterectomy. No adnexal mass. Other: No ascites or free air. The abdominal wall is normal. Musculoskeletal. There are degenerative changes throughout the lumbar spine without evidence for displaced fracture. IMPRESSION: VASCULAR 1. No evidence for active arterial GI bleeding. 2. Chronic vascular disease as detailed above. 3. Atherosclerotic changes of the abdominal aorta without evidence for an aneurysm. NON-VASCULAR 1. The liver surface appears somewhat nodular, raising concern for underlying cirrhosis. 2. Colonic diverticulosis without evidence of acute diverticulitis. 3. Aortic Atherosclerosis (ICD10-I70.0). Electronically Signed   By: Constance Holster M.D.   On: 10/27/2019 02:05     Scheduled Meds: . amLODipine  10 mg Oral Daily  . atorvastatin  80 mg Oral Daily  . busPIRone  15 mg Oral TID  . DULoxetine  60 mg Oral Daily  . insulin aspart  0-15 Units Subcutaneous TID WC  . pantoprazole  40 mg Oral Daily  . [START ON 10/28/2019] pneumococcal 23 valent vaccine  0.5 mL Intramuscular Tomorrow-1000  . QUEtiapine  25 mg Oral TID  . QUEtiapine  25 mg Oral QHS  . traZODone  50 mg Oral QHS  . ziprasidone  20 mg Oral BID   Continuous Infusions:   LOS: 0 days     Enzo Bi, MD [facility] If 7PM-7AM, please contact night-coverage 10/27/2019, 7:54 PM

## 2019-10-27 NOTE — ED Notes (Signed)
Breakfast tray provided to pt.

## 2019-10-27 NOTE — ED Notes (Signed)
Pt taken to CT at this time.

## 2019-10-27 NOTE — H&P (Signed)
History and Physical    Tammy Armstrong [id number] DOB: 1951-05-09 DOA: 10/27/2019  PCP: Gayland Curry, MD   Patient coming from: home  I have personally briefly reviewed patient's old medical records in [place]  Chief Complaint: shortness of breath, rectal bleeding  HPI: Tammy Armstrong is a 69 y.o. female with medical history significant for hypertension, diabetes, COPD hepatitis C and depression who presented to the emergency room after her PCP: Said her hemoglobin was 5.6.  Patient states that for the past 2 weeks she has been having intermittent bright red blood per rectum.  She started having shortness of breath and dizziness a couple days prior.  Denies chest pains or palpitations  ED Course: Vitals in the emergency room were within normal limits.  Hemoglobin 5.3.  CT angiogram abdomen and pelvis showing diverticulosis with no signs of active bleeding.  She was typed and crossed for 2 units and transfusion started in the emergency room Review of Systems: As per HPI otherwise 10 point review of systems negative.    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 She has a 52.00 pack-year smoking history. She has never used smokeless tobacco. She reports that she does not drink alcohol or use drugs.  Allergies  Allergen Reactions   Promethazine Other (See Comments)    studdering Other Reaction: IMPAIRS SPEECH, UNABLE TO VERB studdering Other Reaction: IMPAIRS SPEECH, UNABLE TO VERB    Sumatriptan     Other reaction(s): Other (See Comments) Other Reaction: PANIC ATTACK   Ciprofloxacin Other (See Comments)    Medication is contraindicated with some of pts other meds.   Delirium Delirium    Doxycycline Nausea And Vomiting   Imitrex [Sumatriptan Base] Other (See Comments)    Pt states that it "puts her out of this world".     Morphine And Related Nausea And Vomiting   Penicillins Nausea And Vomiting and Other (See Comments)    Pt is unable to answer additional questions about this medication.     Ranitidine Hcl Other (See Comments)    studdering studdering unknown    Zantac [Ranitidine Hcl] Other (See Comments)    Reaction:  Unknown    Ketorolac Tromethamine Rash    Family History  Problem Relation Age of Onset   Hypertension Mother    Heart attack Mother    Diabetes Mother    Anxiety disorder Mother    Stroke Mother  Diabetes Father    Diabetes Brother    Alcohol abuse Other        siblings   Hypertension Brother    Breast cancer Neg Hx      Prior to Admission medications   Medication Sig Start Date End Date Taking? Authorizing Provider  amLODipine (NORVASC) 10 MG tablet Take 10 mg by mouth daily. 09/17/19  Yes [provider]  acetaminophen (TYLENOL) 500 MG tablet Take 1,000 mg by mouth every 8 (eight) hours as needed for pain. 04/25/18   [provider]  albuterol (PROVENTIL HFA;VENTOLIN HFA) 108 (90 BASE) MCG/ACT inhaler Inhale 2 puffs into the lungs every 6 (six) hours as needed for  wheezing or shortness of breath.     [provider]  ASPIRIN LOW DOSE 81 MG EC tablet TAKE ONE TABLET BY MOUTH EVERY DAY. *DO NOT CRUSH* Patient taking differently: Take 81 mg by mouth daily.  03/08/17   Algernon Huxley, MD  atorvastatin (LIPITOR) 80 MG tablet Take 80 mg by mouth daily.    [provider]  busPIRone (BUSPAR) 15 MG tablet Take 15 mg by mouth 3 (three) times daily.  07/23/16   [provider]  clonazePAM (KLONOPIN) 1 MG tablet Take 1 mg by mouth 2 (two) times daily.    [provider]  clopidogrel (PLAVIX) 75 MG tablet TAKE ONE TABLET BY MOUTH EVERY DAY WITH BREAKFAST Patient taking differently: Take 75 mg by mouth daily.  11/13/16   Algernon Huxley, MD  diclofenac sodium (VOLTAREN) 1 % GEL Apply 2 g topically 4 (four) times daily. 01/10/17   [provider]  docusate sodium (COLACE) 100 MG capsule Take 100 mg by mouth daily.     [provider]  DULoxetine (CYMBALTA) 60 MG capsule Take 60 mg by mouth daily.     [provider]  ferrous sulfate (FERROUSUL) 325 (65 FE) MG tablet Take 325 mg by mouth daily with breakfast. 05/11/19 05/10/20  [provider]  fluticasone (FLONASE) 50 MCG/ACT nasal spray Place 2 sprays into both nostrils daily.    [provider]  fluticasone furoate-vilanterol (BREO ELLIPTA) 100-25 MCG/INH AEPB Inhale 1 puff into the lungs daily.    [provider]  gabapentin (NEURONTIN) 600 MG tablet Take 600 mg by mouth 3 (three) times daily. May take an extra dose at night if needed 05/09/18   [provider]  glipiZIDE (GLUCOTROL) 5 MG tablet Take 2.5-5 mg by mouth 2 (two) times daily before a meal. Take one tablet (5mg ) by mouth at breakfast and one-half tablet (2.5mg ) at lunch    [provider]  metoprolol (TOPROL-XL) 50 MG 24 hr tablet Take 50 mg by mouth daily.      [provider]  pantoprazole (PROTONIX) 40 MG tablet Take 40 mg by mouth 2 (two) times  daily.    [provider]  QUEtiapine (SEROQUEL) 25 MG tablet Take 25 mg by mouth 3 (three) times daily. Take one tablet (25 mg) by mouth every morning, one tablet (25 mg) at noon and two tablets (50mg ) at bedtime    [provider]  Rivaroxaban (XARELTO STARTER PACK) 15 & 20 MG TBPK Follow package directions: Take one 15mg  tablet by mouth twice a day. On day 22, switch to one 20mg  tablet once a day. Take with food. 08/30/19   Laban Emperor, PA-C  traZODone (DESYREL) 50 MG tablet Take 50 mg by mouth at bedtime. 04/18/19   [provider]  ziprasidone (  GEODON) 20 MG capsule Take 20 mg by mouth 2 (two) times daily. 08/23/18   [provider]    Physical Exam: Vitals:   10/26/19 2131 10/27/19 0122 10/27/19 0229  BP: (!) 136/49 (!) 153/55 (!) 153/57  Pulse: 86 78 81  Resp: 16 (!) 22 17  Temp: 98.7 F (37.1 C)  98.6 F (37 C)  TempSrc:   Oral  SpO2: 100% 100% 100%  Weight: 67.6 kg    Height: 5\' 1"  (1.549 m)       Vitals:   10/26/19 2131 10/27/19 0122 10/27/19 0229  BP: (!) 136/49 (!) 153/55 (!) 153/57  Pulse: 86 78 81  Resp: 16 (!) 22 17  Temp: 98.7 F (37.1 C)  98.6 F (37 C)  TempSrc:   Oral  SpO2: 100% 100% 100%  Weight: 67.6 kg    Height: 5\' 1"  (1.549 m)      Constitutional: Alert and awake, oriented x3, not in any acute distress. Eyes: PERLA, EOMI, irises appear normal, anicteric sclera,  ENMT: external ears and nose appear normal, normal hearing             Lips appears normal, oropharynx mucosa, tongue, posterior pharynx appear normal  Neck: neck appears normal, no masses, normal ROM, no thyromegaly, no JVD  CVS: S1-S2 clear, no murmur rubs or gallops,  , no carotid bruits, pedal pulses palpable, No LE edema Respiratory:  clear to auscultation bilaterally, no wheezing, rales or rhonchi. Respiratory effort normal. No accessory muscle use.  Abdomen: soft nontender, nondistended, normal bowel sounds, no hepatosplenomegaly, no  hernias Musculoskeletal: : no cyanosis, clubbing , no contractures or atrophy Neuro: Cranial nerves II-XII intact, sensation, reflexes normal, strength Psych: judgement and insight appear normal, stable mood and affect,  Skin: no rashes or lesions or ulcers, no induration or nodules   Labs on Admission: I have personally reviewed following labs and imaging studies  CBC: Recent Labs  Lab 10/26/19 2138  WBC 9.8  NEUTROABS 5.7  HGB 5.3*  HCT 19.0*  MCV 95.5  PLT [id number]   Basic Metabolic Panel: Recent Labs  Lab 10/26/19 2138  NA 138  K 3.5  CL 108  CO2 21*  GLUCOSE 146*  BUN 11  CREATININE 0.90  CALCIUM 8.5*   GFR: Estimated Creatinine Clearance: 53.3 mL/min (by C-G formula based on SCr of 0.9 mg/dL). Liver Function Tests: No results for input(s): AST, ALT, ALKPHOS, BILITOT, PROT, ALBUMIN in the last 168 hours. No results for input(s): LIPASE, AMYLASE in the last 168 hours. No results for input(s): AMMONIA in the last 168 hours. Coagulation Profile: Recent Labs  Lab 10/27/19 0205  INR 1.1   Cardiac Enzymes: No results for input(s): CKTOTAL, CKMB, CKMBINDEX, TROPONINI in the last 168 hours. BNP (last 3 results) No results for input(s): PROBNP in the last 8760 hours. HbA1C: No results for input(s): HGBA1C in the last 72 hours. CBG: No results for input(s): GLUCAP in the last 168 hours. Lipid Profile: No results for input(s): CHOL, HDL, LDLCALC, TRIG, CHOLHDL, LDLDIRECT in the last 72 hours. Thyroid Function Tests: No results for input(s): TSH, T4TOTAL, FREET4, T3FREE, THYROIDAB in the last 72 hours. Anemia Panel: No results for input(s): VITAMINB12, FOLATE, FERRITIN, TIBC, IRON, RETICCTPCT in the last 72 hours. Urine analysis:    Component Value Date/Time   COLORURINE YELLOW (A) 05/27/2019 0218   APPEARANCEUR CLEAR (A) 05/27/2019 0218   APPEARANCEUR Clear 07/10/2014 1749   LABSPEC 1.025 05/27/2019 0218   LABSPEC 1.025 07/10/2014 1749  PHURINE 5.0 05/27/2019  0218   GLUCOSEU NEGATIVE 05/27/2019 0218   GLUCOSEU Negative 07/10/2014 1749   HGBUR NEGATIVE 05/27/2019 [place] 05/27/2019 0218   BILIRUBINUR Negative 07/10/2014 [place] 05/27/2019 0218   PROTEINUR 30 (A) 05/27/2019 0218   UROBILINOGEN 1.0 07/02/2011 1005   NITRITE NEGATIVE 05/27/2019 0218   LEUKOCYTESUR NEGATIVE 05/27/2019 0218   LEUKOCYTESUR Negative 07/10/2014 1749    Radiological Exams on Admission: CT Angio Abd/Pel W and/or Wo Contrast  Result Date: 10/27/2019 CLINICAL DATA:  GI bleed. EXAM: CTA ABDOMEN AND PELVIS WITHOUT AND WITH CONTRAST TECHNIQUE: Multidetector CT imaging of the abdomen and pelvis was performed using the standard protocol during bolus administration of intravenous contrast. Multiplanar reconstructed images and MIPs were obtained and reviewed to evaluate the vascular anatomy. CONTRAST:  168mL OMNIPAQUE IOHEXOL 350 MG/ML SOLN COMPARISON:  CT of the pelvis dated May 27, 2019 FINDINGS: VASCULAR Aorta: There are atherosclerotic changes throughout the abdominal aorta without evidence for an aneurysm. Celiac: There are atherosclerotic changes at the origin of the celiac axis resulting in mild stenosis. SMA: There is a high-grade stenosis of the proximal SMA. The mid distal SMA are patent. Renals: There is a single right renal artery with moderate atherosclerotic changes at the origin. There is a single left renal artery with a moderate to high-grade stenosis at the origin. IMA: Patent without evidence of aneurysm, dissection, vasculitis or significant stenosis. Inflow: There is moderate narrowing of the bilateral common iliac arteries proximally. There are atherosclerotic changes throughout the bilateral external iliac arteries. Proximal Outflow: Bilateral common femoral and visualized portions of the superficial and profunda femoral arteries are patent without evidence of aneurysm, dissection, vasculitis or significant stenosis. Veins: No  obvious venous abnormality within the limitations of this arterial phase study. Review of the MIP images confirms the above findings. NON-VASCULAR Lower chest: The lung bases are clear. The heart size is normal. Hepatobiliary: The liver surface appears somewhat nodular, raising concern for underlying cirrhosis. Status post cholecystectomy.There is no biliary ductal dilation. Pancreas: Normal contours without ductal dilatation. No peripancreatic fluid collection. Spleen: Unremarkable. Adrenals/Urinary Tract: --Adrenal glands: Unremarkable. --Right kidney/ureter: No hydronephrosis or radiopaque kidney stones. --Left kidney/ureter: Left kidney is slightly atrophic when compared to right. This is felt to be secondary to vascular compromise at the origin of the left renal artery. --Urinary bladder: The urinary bladder is decompressed which limits evaluation. Stomach/Bowel: --Stomach/Duodenum: The stomach is moderately distended. --Small bowel: Unremarkable. --Colon: There are few scattered colonic diverticula without CT evidence for diverticulitis. --Appendix: Not visualized. No right lower quadrant inflammation or free fluid. Lymphatic: --No retroperitoneal lymphadenopathy. --No mesenteric lymphadenopathy. --No pelvic or inguinal lymphadenopathy. Reproductive: Status post hysterectomy. No adnexal mass. Other: No ascites or free air. The abdominal wall is normal. Musculoskeletal. There are degenerative changes throughout the lumbar spine without evidence for displaced fracture. IMPRESSION: VASCULAR 1. No evidence for active arterial GI bleeding. 2. Chronic vascular disease as detailed above. 3. Atherosclerotic changes of the abdominal aorta without evidence for an aneurysm. NON-VASCULAR 1. The liver surface appears somewhat nodular, raising concern for underlying cirrhosis. 2. Colonic diverticulosis without evidence of acute diverticulitis. 3. Aortic Atherosclerosis (ICD10-I70.0). Electronically Signed   By: Constance Holster M.D.   On: 10/27/2019 02:05    EKG: Independently reviewed.   Assessment/Plan Principal Problem:   Lower GI bleed   Symptomatic anemia   Acute blood loss anemia -Continue transfusion of 2 units packed red blood cells  -GI consult  Diabetes ([place]) -Insulin sliding scale coverage    COPD (chronic obstructive pulmonary disease) ([place]) -Not acutely exacerbated -Continue home meds    Essential hypertension -Continue home meds    DVT prophylaxis: SCD Code Status: full code  Family Communication:  none  Disposition Plan: Back to previous home environment Consults called: Gi  Status:inp    Athena Masse MD [facility]     10/27/2019, 2:52 AM

## 2019-10-27 NOTE — ED Notes (Signed)
Pt wanted to leave.  Advised pt to stay due to labwork.  Pt agreeable

## 2019-10-27 NOTE — Consult Note (Signed)
Tammy Armstrong , MD [address], [place], [place], [place], [address] [address], [place], [place], [place], [address] Phone: [phone]  Fax: [phone]  Consultation  Referring Provider:     Dr Damita Dunnings  Primary Care Physician:  Gayland Curry, MD Primary Gastroenterologist:  None         Reason for Consultation:     GI bleed  Date of Admission:  10/27/2019 Date of Consultation:  10/27/2019         HPI:   Tammy Armstrong is a 69 y.o. female with a history of HTN,COPD, presented to the ER after PCP checked Hb and was 5.6 grams . Some recent history of rectal bleeding.   Ct angiogram in the ER showed no acute bleeding, concern for liver cirrhosis by appearance ofliver. Diverticulosis of the colon.   2 months back Hb 13.5 grams and yesterday 5.3 grams with mcv 95.Normal BUN/Cr ratio, platelet count,INR1.1 . Hb 6.7 grams this am .   Home meds include Plavix and Xarelto. Recent DVT in Rt arm to complete xarelto in 11/2019 She states that she has been having blood in the stools for a week on and off.  Initially was large in quantity but have decreased.  Last bowel movement was yesterday which had some blood in it.  Denies any clear melena.  Does mention though that her stool was darker in color previously.  Denies any NSAID use.  Denies any abdominal pain.  Very comfortable at this point of time.  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 Authorizing Provider  acetaminophen (TYLENOL) 500 MG tablet Take 1,000 mg by mouth every 8 (eight) hours as needed for pain. 04/25/18  Yes [provider]  amLODipine (NORVASC) 10 MG tablet Take 10 mg by mouth daily. 09/17/19  Yes [provider]  ASPIRIN LOW DOSE 81 MG EC tablet TAKE ONE TABLET BY MOUTH EVERY DAY. *DO NOT CRUSH* Patient taking differently: Take 81 mg by mouth daily.  03/08/17  Yes Dew, Erskine Squibb, MD  atorvastatin (LIPITOR) 80 MG tablet Take 80 mg by mouth daily.   Yes [provider]  busPIRone (BUSPAR) 15 MG tablet Take 15 mg by mouth 3 (three) times daily.  07/23/16  Yes [provider]  clopidogrel (PLAVIX) 75 MG tablet TAKE ONE TABLET BY MOUTH EVERY DAY WITH BREAKFAST Patient taking differently: Take 75 mg by mouth daily.  11/13/16  Yes Dew, Erskine Squibb, MD  diclofenac sodium (VOLTAREN) 1 % GEL Apply 2 g topically 4 (four) times daily. 01/10/17  Yes [provider]  DULoxetine (CYMBALTA) 60 MG capsule Take 60 mg by mouth daily.    Yes [provider]  ferrous sulfate (FERROUSUL) 325 (65 FE) MG tablet Take 325 mg by mouth daily with breakfast. 05/11/19 05/10/20 Yes [provider]  glipiZIDE (GLUCOTROL) 5 MG tablet Take 2.5-5 mg by mouth 2 (two) times daily before a meal. Take one tablet (5mg ) by mouth at breakfast and one-half tablet (2.5mg ) at lunch   Yes [provider]  metFORMIN (GLUCOPHAGE) 500 MG tablet Take 500 mg by mouth 2 (two) times daily. 09/17/19  Yes [provider]  pantoprazole (PROTONIX) 40 MG  tablet Take 40 mg by mouth daily.    Yes [provider]  QUEtiapine (SEROQUEL) 25 MG tablet Take 25 mg by mouth 3 (three) times daily. Take one tablet (25 mg) by mouth every morning, one tablet (25 mg) at noon and two tablets (50mg ) at bedtime   Yes [provider]  traZODone (DESYREL) 50 MG tablet Take 50 mg by mouth at bedtime. 04/18/19  Yes [provider]  XARELTO 20 MG TABS tablet Take 20 mg by mouth daily. 09/22/19  Yes [provider]  ziprasidone (GEODON) 20 MG capsule Take 20 mg by mouth 2 (two) times daily. 08/23/18  Yes [provider]  clonazePAM (KLONOPIN) 1 MG tablet Take 1 mg by mouth 2 (two) times daily.    [provider]    Family History  Problem Relation Age of Onset   Hypertension Mother    Heart attack Mother    Diabetes Mother    Anxiety disorder Mother    Stroke Mother    Diabetes Father    Diabetes Brother    Alcohol abuse Other        siblings   Hypertension Brother    Breast cancer Neg Hx      Social History   Tobacco Use   Smoking status: Current Every Day Smoker    Packs/day: 1.00    Years: 52.00    Pack years: 52.00    Types: Cigarettes   Smokeless tobacco: Never Used  Substance Use Topics   Alcohol use: No   Drug use: No    Allergies as of 10/26/2019 - Review Complete 10/26/2019  Allergen Reaction Noted   Promethazine Other (See Comments) 12/11/2014   Sumatriptan  02/12/2015   Ciprofloxacin Other (See Comments) 12/11/2014   Doxycycline Nausea And Vomiting 01/18/2015   Imitrex [sumatriptan base] Other (See Comments) 07/01/2011   Morphine and related Nausea And Vomiting 07/01/2011   Penicillins Nausea And Vomiting and Other (See Comments) 01/18/2015   Ranitidine hcl Other (See Comments) 12/11/2014   Zantac [ranitidine hcl] Other (See Comments) 04/29/2015   Ketorolac tromethamine Rash 07/01/2011    Review of Systems:    All systems reviewed and negative except  where noted in HPI.   Physical Exam:  Vital signs in last 24 hours: Temp:  [98.3 F (36.8 C)-98.7 F (37.1 C)] 98.3 F (36.8 C) (04/06 0607) Pulse Rate:  [73-86] 76 (04/06 0730) Resp:  [16-23] 21 (04/06 0730) BP: (132-154)/(42-57) 141/48 (04/06 0730) SpO2:  [96 %-100 %] 98 % (04/06 0730) Weight:  [67.6 kg] 67.6 kg (04/05 2131)   General:   Pleasant, cooperative in NAD Head:  Normocephalic and atraumatic. Eyes:   No icterus.   Conjunctiva pink. PERRLA.  Ears:  Normal auditory acuity. Lungs: Respirations even and unlabored. Lungs clear to auscultation bilaterally.   No wheezes, crackles, or rhonchi.  Heart:  Regular rate and rhythm;  Without murmur, clicks, rubs or gallops Abdomen:  Soft, nondistended, nontender. Normal bowel sounds. No appreciable masses or hepatomegaly.  No rebound or guarding.  Neurologic:  Alert and oriented x3;  grossly normal neurologically. Psych:  Alert and cooperative. Normal affect.  LAB RESULTS: Recent Labs    10/26/19 2138 10/27/19 0612  WBC 9.8 8.3  HGB 5.3* 6.7*  HCT 19.0* 22.3*  PLT 367 308   BMET Recent Labs    10/26/19 2138  NA 138  K 3.5  CL 108  CO2 21*  GLUCOSE 146*  BUN 11  CREATININE 0.90  CALCIUM 8.5*   LFT No results for input(s): PROT, ALBUMIN, AST, ALT, ALKPHOS, BILITOT, BILIDIR, IBILI in the last 72 hours. PT/INR Recent Labs    10/27/19 0205  LABPROT 13.7  INR 1.1    STUDIES: CT Angio Abd/Pel W and/or Wo Contrast  Result Date: 10/27/2019 CLINICAL DATA:  GI bleed. EXAM: CTA ABDOMEN AND PELVIS WITHOUT AND WITH CONTRAST TECHNIQUE: Multidetector CT imaging of the abdomen and pelvis was performed using the standard protocol during bolus administration of intravenous contrast. Multiplanar reconstructed images and MIPs were obtained and reviewed to evaluate the vascular anatomy. CONTRAST:  167mL OMNIPAQUE IOHEXOL 350 MG/ML SOLN COMPARISON:  CT of the pelvis dated May 27, 2019 FINDINGS: VASCULAR Aorta: There are  atherosclerotic changes throughout the abdominal aorta without evidence for an aneurysm. Celiac: There are atherosclerotic changes at the origin of the celiac axis resulting in mild stenosis. SMA: There is a high-grade stenosis of the proximal SMA. The mid distal SMA are patent. Renals: There is a single right renal artery with moderate atherosclerotic changes at the origin. There is a single left renal artery with a moderate to high-grade stenosis at the origin. IMA: Patent without evidence of aneurysm, dissection, vasculitis or significant stenosis. Inflow: There is moderate narrowing of the bilateral common iliac arteries proximally. There are atherosclerotic changes throughout the bilateral external iliac arteries. Proximal Outflow: Bilateral common femoral and visualized portions of the superficial and profunda femoral arteries are patent without evidence of aneurysm, dissection, vasculitis or significant stenosis. Veins: No obvious venous abnormality within the limitations of this arterial phase study. Review of the MIP images confirms the above findings. NON-VASCULAR Lower chest: The lung bases are clear. The heart size is normal. Hepatobiliary: The liver surface appears somewhat nodular, raising concern for underlying cirrhosis. Status post cholecystectomy.There is no biliary ductal dilation. Pancreas: Normal contours without ductal dilatation. No peripancreatic fluid collection. Spleen: Unremarkable. Adrenals/Urinary Tract: --Adrenal glands: Unremarkable. --Right kidney/ureter: No hydronephrosis or radiopaque kidney stones. --Left kidney/ureter: Left kidney is slightly atrophic when compared to right. This is felt to be secondary to vascular compromise at the origin of the left renal artery. --Urinary bladder: The urinary bladder is decompressed which limits evaluation. Stomach/Bowel: --Stomach/Duodenum: The stomach is moderately distended. --Small bowel: Unremarkable. --Colon: There are few scattered  colonic diverticula without CT evidence for diverticulitis. --Appendix: Not visualized. No right lower quadrant inflammation or free fluid. Lymphatic: --No retroperitoneal lymphadenopathy. --No mesenteric lymphadenopathy. --No pelvic or inguinal lymphadenopathy. Reproductive: Status post hysterectomy. No adnexal mass. Other: No ascites or free air. The abdominal wall is normal. Musculoskeletal. There are degenerative changes throughout the lumbar spine without evidence for displaced fracture. IMPRESSION: VASCULAR 1. No evidence for active arterial GI bleeding. 2. Chronic vascular disease as  detailed above. 3. Atherosclerotic changes of the abdominal aorta without evidence for an aneurysm. NON-VASCULAR 1. The liver surface appears somewhat nodular, raising concern for underlying cirrhosis. 2. Colonic diverticulosis without evidence of acute diverticulitis. 3. Aortic Atherosclerosis (ICD10-I70.0). Electronically Signed   By: Constance Holster M.D.   On: 10/27/2019 02:05      Impression / Plan:   Tammy Armstrong is a 69 y.o. y/o female presents with rectal bleeding.  CT angiogram was negative for any acute bleed.  Diverticulosis of the colon noted.  She denies any prior GI evaluation including an EGD or colonoscopy.  She has been on Plavix as well as Xarelto. Unclear by the history if i she has only had a lower GI bleed or a combination of an upper and lower GI bleed.  She does attest to some black stools previously.  Plan 1.  She will need EGD and colonoscopy once off Xarelto for at least 2 days and Plavix for at least 3 to 5 days.  In the interim transfuse and monitor.  If there is a further drop in hemoglobin and concern for an active bleed obtain a tagged RBC scan.  I will follow the patient and once she is off her blood thinners we can plan for her procedures.  Thank you for involving me in the care of this patient.      LOS: 0 days   Tammy Bellows, MD  10/27/2019, 8:36 AM

## 2019-10-27 NOTE — ED Provider Notes (Signed)
[facility] Emergency Department Provider Note  ____________________________________________   First MD Initiated Contact with Patient 10/27/19 [phone]     (approximate)  I have reviewed the triage vital signs and the nursing notes.   HISTORY  Chief Complaint Abnormal Lab    HPI Tammy Armstrong is a 69 y.o. female with below list of previous medical conditions presents to the emergency department because she was notified by her primary care provider that her hemoglobin was 5.6 which was noted on routine blood work that was performed yesterday.  Patient does admit to bright red blood per rectum times a "couple of weeks".  Patient does admit to increasing fatigue, dyspnea and occasional chest discomfort.  Patient also admits to intermittent dizziness.  Patient denies any abdominal pain.     Past Medical History:  Diagnosis Date  . Anxiety   . Arthritis   . Carotid artery occlusion   . Cigarette smoker one half pack a day or less   . COPD (chronic obstructive pulmonary disease) ([place]) 07/04/2011  . Depression   . Diabetes mellitus   . Diabetes mellitus 07/04/2011  . GERD (gastroesophageal reflux disease)   . Hepatitis C   . Hypercholesterolemia   . Hypertension   . Migraines   . Pancreatitis   . 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 Respiratory failure ([place]) 07/04/2011  . Seizure ([place]) 07/02/2011  . Shortness of breath dyspnea   . Stroke [facility])     Patient Active Problem List   Diagnosis Date Noted  . Symptomatic anemia 10/27/2019  . Acute blood loss anemia 10/27/2019  . Lower GI bleed 10/27/2019  . Syncope 05/27/2019  . Nausea & vomiting 05/27/2019  . Essential hypertension 05/27/2019  . AKI (acute kidney injury) ([place])   . PNA (pneumonia) 10/12/2018  . Tobacco use disorder 07/13/2016  . 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 Right upper extremity numbness   . TIA (transient ischemic attack) 06/27/2016  . Carotid stenosis 04/08/2015  .  Migraine 04/06/2015  . Dementia with behavioral disturbance ([place]) 01/19/2015  . Diabetes ([place]) 07/04/2011  . Anxiety 07/04/2011  . COPD (chronic obstructive pulmonary disease) ([place]) 07/04/2011  . Purulent bronchitis ([place]) 07/04/2011  . 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 ABDOMINAL HYSTERECTOMY    . APPENDECTOMY    . BACK SURGERY  2012   cyst removed from spine ,   . CHOLECYSTECTOMY    . ENDARTERECTOMY Left 05/18/2015   Procedure: ENDARTERECTOMY CAROTID;  Surgeon: Algernon Huxley, MD;  Location: [facility] ORS;  Service: Vascular;  Laterality: Left;    Prior to Admission medications   Medication Sig Start Date End Date Taking? Authorizing Provider  acetaminophen (TYLENOL) 500 MG tablet Take 1,000 mg by mouth every 8 (eight) hours as needed for pain. 04/25/18  Yes [provider]  amLODipine (NORVASC) 10 MG tablet Take 10 mg by mouth daily. 09/17/19  Yes [provider]  ASPIRIN LOW DOSE 81 MG EC tablet TAKE ONE TABLET BY MOUTH EVERY DAY. *DO NOT CRUSH* Patient taking differently: Take 81 mg by mouth daily.  03/08/17  Yes Dew, Erskine Squibb, MD  atorvastatin (LIPITOR) 80 MG tablet Take 80 mg by mouth daily.   Yes [provider]  busPIRone (BUSPAR) 15 MG tablet Take 15 mg by mouth 3 (three) times daily.  07/23/16  Yes [provider]  clopidogrel (PLAVIX) 75 MG tablet TAKE ONE TABLET BY MOUTH EVERY DAY WITH  BREAKFAST Patient taking differently: Take 75 mg by mouth daily.  11/13/16  Yes Dew, Erskine Squibb, MD  diclofenac sodium (VOLTAREN) 1 % GEL Apply 2 g topically 4 (four) times daily. 01/10/17  Yes [provider]  DULoxetine (CYMBALTA) 60 MG capsule Take 60 mg by mouth daily.    Yes [provider]  ferrous sulfate (FERROUSUL) 325 (65 FE) MG tablet Take 325 mg by mouth daily with breakfast. 05/11/19 05/10/20 Yes [provider]  glipiZIDE (GLUCOTROL) 5 MG tablet Take 2.5-5 mg by mouth  2 (two) times daily before a meal. Take one tablet (5mg ) by mouth at breakfast and one-half tablet (2.5mg ) at lunch   Yes [provider]  metFORMIN (GLUCOPHAGE) 500 MG tablet Take 500 mg by mouth 2 (two) times daily. 09/17/19  Yes [provider]  pantoprazole (PROTONIX) 40 MG tablet Take 40 mg by mouth daily.    Yes [provider]  QUEtiapine (SEROQUEL) 25 MG tablet Take 25 mg by mouth 3 (three) times daily. Take one tablet (25 mg) by mouth every morning, one tablet (25 mg) at noon and two tablets (50mg ) at bedtime   Yes [provider]  traZODone (DESYREL) 50 MG tablet Take 50 mg by mouth at bedtime. 04/18/19  Yes [provider]  XARELTO 20 MG TABS tablet Take 20 mg by mouth daily. 09/22/19  Yes [provider]  ziprasidone (GEODON) 20 MG capsule Take 20 mg by mouth 2 (two) times daily. 08/23/18  Yes [provider]  clonazePAM (KLONOPIN) 1 MG tablet Take 1 mg by mouth 2 (two) times daily.    [provider]    Allergies Promethazine, Sumatriptan, Ciprofloxacin, Doxycycline, Imitrex [sumatriptan base], Morphine and related, Penicillins, Ranitidine hcl, Zantac [ranitidine hcl], and Ketorolac tromethamine  Family History  Problem Relation Age of Onset  . Hypertension Mother   . Heart attack Mother   . Diabetes Mother   . Anxiety disorder Mother   . Stroke Mother   . Diabetes Father   . Diabetes Brother   . Alcohol abuse Other        siblings  . Hypertension Brother   . Breast cancer Neg Hx     Social History Social History   Tobacco Use  . Smoking status: Current Every Day Smoker    Packs/day: 1.00    Years: 52.00    Pack years: 52.00    Types: Cigarettes  . Smokeless tobacco: Never Used  Substance Use Topics  . Alcohol use: No  . Drug use: No    Review of Systems Constitutional: No fever/chills Eyes: No visual changes. ENT: No sore throat. Cardiovascular: Denies chest pain. Respiratory: Denies  shortness of breath. Gastrointestinal: No abdominal pain.  No nausea, no vomiting.  No diarrhea.  No constipation. Genitourinary: Negative for dysuria. Musculoskeletal: Negative for neck pain.  Negative for back pain. Integumentary: Negative for rash. Neurological: Negative for headaches, focal weakness or numbness.   ____________________________________________   PHYSICAL EXAM:  VITAL SIGNS: ED Triage Vitals [10/26/19 2131]  Enc Vitals Group     BP (!) 136/49     Pulse Rate 86     Resp 16     Temp 98.7 F (37.1 C)     Temp src      SpO2 100 %     Weight 67.6 kg (149 lb)     Height 1.549 m (5\' 1" )     Head Circumference      Peak Flow  Pain Score 7     Pain Loc      Pain Edu?      Excl. in [place]?     Constitutional: Alert and oriented.  Eyes: Conjunctivae are normal.  Mouth/Throat: Patient is wearing a mask. Neck: No stridor.  No meningeal signs.   Cardiovascular: Normal rate, regular rhythm. Good peripheral circulation. Grossly normal heart sounds. Respiratory: Normal respiratory effort.  No retractions. Gastrointestinal: Soft and nontender. No distention.  Musculoskeletal: No lower extremity tenderness nor edema. No gross deformities of extremities. Neurologic:  Normal speech and language. No gross focal neurologic deficits are appreciated.  Skin:  Skin is warm, dry and intact. Psychiatric: Mood and affect are normal. Speech and behavior are normal.  ____________________________________________   LABS (all labs ordered are listed, but only abnormal results are displayed)  Labs Reviewed  CBC WITH DIFFERENTIAL/PLATELET - Abnormal; Notable for the following components:      Result Value   RBC 1.99 (*)    Hemoglobin 5.3 (*)    HCT 19.0 (*)    MCHC 27.9 (*)    All other components within normal limits  BASIC METABOLIC PANEL - Abnormal; Notable for the following components:   CO2 21 (*)    Glucose, Bld 146 (*)    Calcium 8.5 (*)    All other components  within normal limits  PROTIME-INR  HEMOGLOBIN A1C  TYPE AND SCREEN  PREPARE RBC (CROSSMATCH)   ____________________________________________  EKG  ED ECG REPORT I, [place] N Chace Bisch, the attending physician, personally viewed and interpreted this ECG.   Date: 10/26/2019  EKG Time: 9:31 PM  Rate: 87  Rhythm: Normal sinus rhythm  Axis: Normal  Intervals: Normal  ST&T Change: None  ____________________________________________  RADIOLOGY I, [place] N Kura Bethards, personally viewed and evaluated these images (plain radiographs) as part of my medical decision making, as well as reviewing the written report by the radiologist.  ED MD interpretation: CTA abdomen pelvis revealed no evidence of active arterial GI bleed diverticulosis without evidence of diverticulitis  Official radiology report(s): CT Angio Abd/Pel W and/or Wo Contrast  Result Date: 10/27/2019 CLINICAL DATA:  GI bleed. EXAM: CTA ABDOMEN AND PELVIS WITHOUT AND WITH CONTRAST TECHNIQUE: Multidetector CT imaging of the abdomen and pelvis was performed using the standard protocol during bolus administration of intravenous contrast. Multiplanar reconstructed images and MIPs were obtained and reviewed to evaluate the vascular anatomy. CONTRAST:  128mL OMNIPAQUE IOHEXOL 350 MG/ML SOLN COMPARISON:  CT of the pelvis dated May 27, 2019 FINDINGS: VASCULAR Aorta: There are atherosclerotic changes throughout the abdominal aorta without evidence for an aneurysm. Celiac: There are atherosclerotic changes at the origin of the celiac axis resulting in mild stenosis. SMA: There is a high-grade stenosis of the proximal SMA. The mid distal SMA are patent. Renals: There is a single right renal artery with moderate atherosclerotic changes at the origin. There is a single left renal artery with a moderate to high-grade stenosis at the origin. IMA: Patent without evidence of aneurysm, dissection, vasculitis or significant stenosis. Inflow: There is moderate  narrowing of the bilateral common iliac arteries proximally. There are atherosclerotic changes throughout the bilateral external iliac arteries. Proximal Outflow: Bilateral common femoral and visualized portions of the superficial and profunda femoral arteries are patent without evidence of aneurysm, dissection, vasculitis or significant stenosis. Veins: No obvious venous abnormality within the limitations of this arterial phase study. Review of the MIP images confirms the above findings. NON-VASCULAR Lower chest: The lung bases are clear. The heart  size is normal. Hepatobiliary: The liver surface appears somewhat nodular, raising concern for underlying cirrhosis. Status post cholecystectomy.There is no biliary ductal dilation. Pancreas: Normal contours without ductal dilatation. No peripancreatic fluid collection. Spleen: Unremarkable. Adrenals/Urinary Tract: --Adrenal glands: Unremarkable. --Right kidney/ureter: No hydronephrosis or radiopaque kidney stones. --Left kidney/ureter: Left kidney is slightly atrophic when compared to right. This is felt to be secondary to vascular compromise at the origin of the left renal artery. --Urinary bladder: The urinary bladder is decompressed which limits evaluation. Stomach/Bowel: --Stomach/Duodenum: The stomach is moderately distended. --Small bowel: Unremarkable. --Colon: There are few scattered colonic diverticula without CT evidence for diverticulitis. --Appendix: Not visualized. No right lower quadrant inflammation or free fluid. Lymphatic: --No retroperitoneal lymphadenopathy. --No mesenteric lymphadenopathy. --No pelvic or inguinal lymphadenopathy. Reproductive: Status post hysterectomy. No adnexal mass. Other: No ascites or free air. The abdominal wall is normal. Musculoskeletal. There are degenerative changes throughout the lumbar spine without evidence for displaced fracture. IMPRESSION: VASCULAR 1. No evidence for active arterial GI bleeding. 2. Chronic vascular  disease as detailed above. 3. Atherosclerotic changes of the abdominal aorta without evidence for an aneurysm. NON-VASCULAR 1. The liver surface appears somewhat nodular, raising concern for underlying cirrhosis. 2. Colonic diverticulosis without evidence of acute diverticulitis. 3. Aortic Atherosclerosis (ICD10-I70.0). Electronically Signed   By: Constance Holster M.D.   On: 10/27/2019 02:05    ____________________________________________   PROCEDURES    .Critical Care Performed by: Gregor Hams, MD Authorized by: Gregor Hams, MD   Critical care provider statement:    Critical care time (minutes):  30   Critical care time was exclusive of:  Separately billable procedures and treating other patients (GI bleed with severe anemia)   Critical care was time spent personally by me on the following activities:  Development of treatment plan with patient or surrogate, discussions with consultants, evaluation of patient's response to treatment, examination of patient, obtaining history from patient or surrogate, ordering and performing treatments and interventions, ordering and review of laboratory studies, ordering and review of radiographic studies, pulse oximetry, re-evaluation of patient's condition and review of old charts     ____________________________________________   INITIAL IMPRESSION / MDM / [place] / ED COURSE  As part of my medical decision making, I reviewed the following data within the electronic MEDICAL RECORD NUMBER 69 year old female presented with above-stated history and physical exam secondary to GI bleed with severe anemia.  Patient's hemoglobin 5.3 in the emergency department.  Spoke with the patient regarding blood transfusion and pain informed consent.  CT scan of the abdomen pelvis revealed evidence of diverticulosis however no active extravasation.  Patient discussed with Dr. Damita Dunnings for hospital admission for further evaluation and management of  GI bleed with severe anemia     ____________________________________________  FINAL CLINICAL IMPRESSION(S) / ED DIAGNOSES  Final diagnoses:  Gastrointestinal hemorrhage, unspecified gastrointestinal hemorrhage type  Iron deficiency anemia due to chronic blood loss     MEDICATIONS GIVEN DURING THIS VISIT:  Medications  acetaminophen (TYLENOL) tablet 650 mg (has no administration in time range)    Or  acetaminophen (TYLENOL) suppository 650 mg (has no administration in time range)  ondansetron (ZOFRAN) tablet 4 mg (has no administration in time range)    Or  ondansetron (ZOFRAN) injection 4 mg (has no administration in time range)  HYDROcodone-acetaminophen (NORCO/VICODIN) 5-325 MG per tablet 1-2 tablet (has no administration in time range)  insulin aspart (novoLOG) injection 0-15 Units (has no administration in time range)  0.9 %  sodium chloride infusion (10 mL/hr Intravenous New Bag/Given 10/27/19 0226)  iohexol (OMNIPAQUE) 350 MG/ML injection 100 mL (100 mLs Intravenous Contrast Given 10/27/19 0128)     ED Discharge Orders    None      *Please note:  EUNISE WIGGIN was evaluated in Emergency Department on 10/27/2019 for the symptoms described in the history of present illness. She was evaluated in the context of the global COVID-19 pandemic, which necessitated consideration that the patient might be at risk for infection with the SARS-CoV-2 virus that causes COVID-19. Institutional protocols and algorithms that pertain to the evaluation of patients at risk for COVID-19 are in a state of rapid change based on information released by regulatory bodies including the CDC and federal and state organizations. These policies and algorithms were followed during the patient's care in the ED.  Some ED evaluations and interventions may be delayed as a result of limited staffing during the pandemic.*  Note:  This document was prepared using Dragon voice recognition software and may include  unintentional dictation errors.   Gregor Hams, MD 10/27/19 [phone]

## 2019-10-28 ENCOUNTER — Telehealth (INDEPENDENT_AMBULATORY_CARE_PROVIDER_SITE_OTHER): Payer: Self-pay

## 2019-10-28 LAB — BPAM RBC
Blood Product Expiration Date: 202105062359
Blood Product Expiration Date: 202105062359
ISSUE DATE / TIME: 202104060214
ISSUE DATE / TIME: 202104060524
Unit Type and Rh: 5100
Unit Type and Rh: 5100

## 2019-10-28 LAB — TYPE AND SCREEN
ABO/RH(D): O POS
Antibody Screen: NEGATIVE
Unit division: 0
Unit division: 0

## 2019-10-28 LAB — CBC
HCT: 28.3 % — ABNORMAL LOW (ref 36.0–46.0)
Hemoglobin: 8.7 g/dL — ABNORMAL LOW (ref 12.0–15.0)
MCH: 27.7 pg (ref 26.0–34.0)
MCHC: 30.7 g/dL (ref 30.0–36.0)
MCV: 90.1 fL (ref 80.0–100.0)
Platelets: 302 10*3/uL (ref 150–400)
RBC: 3.14 MIL/uL — ABNORMAL LOW (ref 3.87–5.11)
RDW: 14.6 % (ref 11.5–15.5)
WBC: 6.2 10*3/uL (ref 4.0–10.5)
nRBC: 0 % (ref 0.0–0.2)

## 2019-10-28 LAB — BASIC METABOLIC PANEL
Anion gap: 5 (ref 5–15)
BUN: 14 mg/dL (ref 8–23)
CO2: 25 mmol/L (ref 22–32)
Calcium: 8.4 mg/dL — ABNORMAL LOW (ref 8.9–10.3)
Chloride: 108 mmol/L (ref 98–111)
Creatinine, Ser: 0.73 mg/dL (ref 0.44–1.00)
GFR calc Af Amer: >60 mL/min (ref >60)
GFR calc non Af Amer: >60 mL/min (ref >60)
Glucose, Bld: 119 mg/dL — ABNORMAL HIGH (ref 70–99)
Potassium: 4.1 mmol/L (ref 3.5–5.1)
Sodium: 138 mmol/L (ref 135–145)

## 2019-10-28 LAB — GLUCOSE, CAPILLARY
Glucose-Capillary: 137 mg/dL — ABNORMAL HIGH (ref 70–99)
Glucose-Capillary: 186 mg/dL — ABNORMAL HIGH (ref 70–99)
Glucose-Capillary: 166 mg/dL — ABNORMAL HIGH (ref 70–99)
Glucose-Capillary: 156 mg/dL — ABNORMAL HIGH (ref 70–99)

## 2019-10-28 LAB — MAGNESIUM: Magnesium: 1.9 mg/dL (ref 1.7–2.4)

## 2019-10-28 MED ORDER — ENSURE MAX PROTEIN PO LIQD
11.0000 [oz_av] | Freq: Two times a day (BID) | ORAL | Status: DC
  Administered 2019-10-28 – 2019-10-29 (×3): 11 [oz_av] via ORAL
  Filled 2019-10-28: qty 330

## 2019-10-28 NOTE — Telephone Encounter (Signed)
I returned the  call to pam and made her aware of the note above from the NP .

## 2019-10-28 NOTE — Progress Notes (Signed)
PROGRESS NOTE    Tammy Armstrong  [id number] DOB: [birth year] DOA: 10/27/2019 PCP: Gayland Curry, MD    Assessment & Plan:   Principal Problem:   Lower GI bleed Active Problems:   Diabetes ([place])   COPD (chronic obstructive pulmonary disease) ([place])   Essential hypertension   Symptomatic anemia   Acute blood loss anemia    Tammy Armstrong is a 69 y.o. Caucasian female with medical history significant for hypertension, diabetes, COPD hepatitis C and depression who presented to the emergency room after her PCP: Said her hemoglobin was 5.6.  Patient states that for the past 2 weeks she has been having intermittent bright red blood per rectum.  She started having shortness of breath and dizziness a couple days prior.  Denies chest pains or palpitations   Lower GI bleed   Symptomatic anemia   Acute blood loss anemia --Pt was on ASA, Xarelto and Plavix prior to presentation. -s/p 2 units packed red blood cells  -GI consulted, will perform EGD/colonoscopy after Xarelto and Plavix washout (pt last took Plavix on Monday 4/5.    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 Arita Miss, was consulted, who recommended she start xarelto for her upper extremity DVT, in addition to her plavix and ASA." PLAN: --I advised pt today to stop ASA 81 after discharge, if she still needs to be on Xarelto and Plavix, since triple blood thinner therapy greatly increases the risk of bleeding.   DVT prophylaxis: SCD/Compression stockings Code Status: Full code  Family Communication:  Disposition Plan: Home after EGD/colonoscopy,  likely in 2 days   Subjective and Interval History:  Pt reported feeling much better after the blood transfusion.  Has had 2 BM's and didn't note blood.  No fever, dyspnea, chest pain, abdominal pain, N/V/D, dysuria.   Objective: Vitals:   10/27/19 2058 10/28/19 0114 10/28/19 0641 10/28/19 1135  BP: (!) 145/54 (!) 153/64 (!) 147/65 (!) 135/55  Pulse: 78 87 76 76  Resp:  20  15  Temp: 98.7 F (37.1 C) 98.1 F (36.7 C) 98.6 F (37 C) 98.5 F (36.9 C)  TempSrc: Oral Oral Oral Oral  SpO2: 98% 100% 100% 100%  Weight:      Height:        Intake/Output Summary (Last 24 hours) at 10/28/2019 1746 Last data filed at 10/28/2019 1702 Gross per 24 hour  Intake --  Output 150 ml  Net -150 ml   Filed Weights   10/26/19 2131  Weight: 67.6 kg    Examination:   Constitutional: NAD, AAOx3 HEENT: conjunctivae and lids normal, EOMI CV: RRR no M,R,G. Distal pulses +2.  No cyanosis.   RESP: CTA B/L, normal respiratory effort  GI: +BS, NTND Extremities: No effusions, edema, or tenderness in BLE SKIN: warm, dry and intact Neuro: II - XII grossly intact.  Sensation intact Psych: Normal mood and affect.  Appropriate judgement and reason   Data Reviewed: I have personally reviewed following labs and imaging studies  CBC: Recent Labs  Lab 10/26/19 2138 10/27/19 0612 10/27/19 0937 10/28/19 0447  WBC 9.8 8.3 7.3 6.2  NEUTROABS 5.7  5.1  --   --   HGB 5.3* 6.7* 8.9* 8.7*  HCT 19.0* 22.3* 28.2* 28.3*  MCV 95.5 92.5 88.7 90.1  PLT 367 308 309 [id number]   Basic Metabolic Panel: Recent Labs  Lab 10/26/19 2138 10/28/19 0447  NA 138 138  K 3.5 4.1  CL 108 108  CO2 21* 25  GLUCOSE 146* 119*  BUN 11 14  CREATININE 0.90 0.73  CALCIUM 8.5* 8.4*  MG  --  1.9   GFR: Estimated Creatinine Clearance: 60 mL/min (by C-G formula based on SCr of 0.73 mg/dL). Liver Function Tests: No results for input(s): AST, ALT, ALKPHOS, BILITOT, PROT, ALBUMIN in the last 168 hours. No results for input(s):  LIPASE, AMYLASE in the last 168 hours. No results for input(s): AMMONIA in the last 168 hours. Coagulation Profile: Recent Labs  Lab 10/27/19 0205  INR 1.1   Cardiac Enzymes: No results for input(s): CKTOTAL, CKMB, CKMBINDEX, TROPONINI in the last 168 hours. BNP (last 3 results) No results for input(s): PROBNP in the last 8760 hours. HbA1C: Recent Labs    10/26/19 2138  HGBA1C 5.6   CBG: Recent Labs  Lab 10/27/19 2128 10/27/19 2209 10/28/19 0746 10/28/19 1136 10/28/19 1651  GLUCAP 320* 294* 137* 186* 166*   Lipid Profile: No results for input(s): CHOL, HDL, LDLCALC, TRIG, CHOLHDL, LDLDIRECT in the last 72 hours. Thyroid Function Tests: No results for input(s): TSH, T4TOTAL, FREET4, T3FREE, THYROIDAB in the last 72 hours. Anemia Panel: No results for input(s): VITAMINB12, FOLATE, FERRITIN, TIBC, IRON, RETICCTPCT in the last 72 hours. Sepsis Labs: No results for input(s): PROCALCITON, LATICACIDVEN in the last 168 hours.  Recent Results (from the past 240 hour(s))  SARS CORONAVIRUS 2 (TAT 6-24 HRS) Nasopharyngeal Nasopharyngeal Swab     Status: None   Collection Time: 10/27/19  9:36 AM   Specimen: Nasopharyngeal Swab  Result Value Ref Range Status   SARS Coronavirus 2 NEGATIVE NEGATIVE Final    Comment: (NOTE) SARS-CoV-2 target nucleic acids are NOT DETECTED. The SARS-CoV-2 RNA is generally detectable in upper and lower respiratory specimens during the acute phase of infection. Negative results do not preclude SARS-CoV-2 infection, do not rule out co-infections with other pathogens, and should not be used as the sole basis for treatment or other patient management decisions. Negative results must be combined with clinical observations, patient history, and epidemiological information. The expected result is Negative. Fact Sheet for Patients: SugarRoll.be Fact Sheet for Healthcare  Providers: https://www.woods-mathews.com/ This test is not yet approved or cleared by the [place] FDA and  has been authorized for detection and/or diagnosis of SARS-CoV-2 by FDA under an Emergency Use Authorization (EUA). This EUA will remain  in effect (meaning this test can be used) for the duration of the COVID-19 declaration under Section 56 4(b)(1) of the Act, 21 U.S.C. section 360bbb-3(b)(1), unless the authorization is terminated or revoked sooner. Performed at [facility], [place] [address], [place], [place] [address]       Radiology Studies: CT Angio Abd/Pel W and/or Wo Contrast  Result Date: 10/27/2019 CLINICAL DATA:  GI bleed. EXAM: CTA ABDOMEN AND PELVIS WITHOUT AND WITH CONTRAST TECHNIQUE: Multidetector CT imaging of the abdomen and pelvis was performed using the standard protocol during bolus administration of intravenous contrast. Multiplanar reconstructed images and MIPs were obtained and reviewed to evaluate the vascular anatomy. CONTRAST:  127mL OMNIPAQUE IOHEXOL 350 MG/ML SOLN COMPARISON:  CT of the pelvis dated May 27, 2019 FINDINGS: VASCULAR Aorta: There are atherosclerotic  changes throughout the abdominal aorta without evidence for an aneurysm. Celiac: There are atherosclerotic changes at the origin of the celiac axis resulting in mild stenosis. SMA: There is a high-grade stenosis of the proximal SMA. The mid distal SMA are patent. Renals: There is a single right renal artery with moderate atherosclerotic changes at the origin. There is a single left renal artery with a moderate to high-grade stenosis at the origin. IMA: Patent without evidence of aneurysm, dissection, vasculitis or significant stenosis. Inflow: There is moderate narrowing of the bilateral common iliac arteries proximally. There are atherosclerotic changes throughout the bilateral external iliac arteries. Proximal Outflow: Bilateral common femoral and visualized portions of the  superficial and profunda femoral arteries are patent without evidence of aneurysm, dissection, vasculitis or significant stenosis. Veins: No obvious venous abnormality within the limitations of this arterial phase study. Review of the MIP images confirms the above findings. NON-VASCULAR Lower chest: The lung bases are clear. The heart size is normal. Hepatobiliary: The liver surface appears somewhat nodular, raising concern for underlying cirrhosis. Status post cholecystectomy.There is no biliary ductal dilation. Pancreas: Normal contours without ductal dilatation. No peripancreatic fluid collection. Spleen: Unremarkable. Adrenals/Urinary Tract: --Adrenal glands: Unremarkable. --Right kidney/ureter: No hydronephrosis or radiopaque kidney stones. --Left kidney/ureter: Left kidney is slightly atrophic when compared to right. This is felt to be secondary to vascular compromise at the origin of the left renal artery. --Urinary bladder: The urinary bladder is decompressed which limits evaluation. Stomach/Bowel: --Stomach/Duodenum: The stomach is moderately distended. --Small bowel: Unremarkable. --Colon: There are few scattered colonic diverticula without CT evidence for diverticulitis. --Appendix: Not visualized. No right lower quadrant inflammation or free fluid. Lymphatic: --No retroperitoneal lymphadenopathy. --No mesenteric lymphadenopathy. --No pelvic or inguinal lymphadenopathy. Reproductive: Status post hysterectomy. No adnexal mass. Other: No ascites or free air. The abdominal wall is normal. Musculoskeletal. There are degenerative changes throughout the lumbar spine without evidence for displaced fracture. IMPRESSION: VASCULAR 1. No evidence for active arterial GI bleeding. 2. Chronic vascular disease as detailed above. 3. Atherosclerotic changes of the abdominal aorta without evidence for an aneurysm. NON-VASCULAR 1. The liver surface appears somewhat nodular, raising concern for underlying cirrhosis. 2.  Colonic diverticulosis without evidence of acute diverticulitis. 3. Aortic Atherosclerosis (ICD10-I70.0). Electronically Signed   By: Constance Holster M.D.   On: 10/27/2019 02:05     Scheduled Meds: . amLODipine  10 mg Oral Daily  . atorvastatin  80 mg Oral Daily  . busPIRone  15 mg Oral TID  . docusate sodium  100 mg Oral BID  . DULoxetine  60 mg Oral Daily  . insulin aspart  0-15 Units Subcutaneous TID WC  . pantoprazole  40 mg Oral Daily  . pneumococcal 23 valent vaccine  0.5 mL Intramuscular Tomorrow-1000  . Ensure Max Protein  11 oz Oral BID  . QUEtiapine  25 mg Oral TID  . QUEtiapine  25 mg Oral QHS  . traZODone  50 mg Oral QHS  . ziprasidone  20 mg Oral BID   Continuous Infusions:   LOS: 1 day     Enzo Bi, MD [facility] If 7PM-7AM, please contact night-coverage 10/28/2019, 5:46 PM

## 2019-10-28 NOTE — TOC Initial Note (Signed)
Transition of Care [facility]) - Initial/Assessment Note    Patient Details  Name: Tammy Armstrong MRN: [id number] Date of Birth: [birth year]  Transition of Care [facility]) CM/SW Contact:    Candie Chroman, LCSW Phone Number: 10/28/2019, 10:41 AM  Clinical Narrative:  Readmission prevention screen complete. CSW met with patient. No supports at bedside. CSW introduced role and explained that discharge planning would be discussed. Patient's PCP is Gayland Curry, MD at [facility] in [place]. Pharmacy is [facility]. No issues obtaining medications. Patient does not drive. Her peer-support specialist transports her as needed. Patient has a walker at home. She has an aide through [place] come to her home three times a week for two hours. No further concerns. CSW encouraged patient to contact CSW as needed. CSW will continue to follow patient for support and facilitate return home when stable.                Expected Discharge Plan: [place] Barriers to Discharge: Continued Medical Work up   Patient Goals and CMS Choice     Choice offered to / list presented to : NA  Expected Discharge Plan and Services Expected Discharge Plan: [place] Acute Care Choice: Resumption of Svcs/PTA Provider Living arrangements for the past 2 months: Apartment                                      Prior Living Arrangements/Services Living arrangements for the past 2 months: Apartment   Patient language and need for interpreter reviewed:: Yes Do you feel safe going back to the place where you live?: Yes      Need for Family Participation in Patient Care: Yes (Comment) Care giver support system in place?: Yes (comment) Current home services: DME, Homehealth aide Criminal Activity/Legal Involvement Pertinent to Current Situation/Hospitalization: No - Comment as needed  Activities of Daily Living Home Assistive Devices/Equipment: Walker (specify type) ADL  Screening (condition at time of admission) Patient's cognitive ability adequate to safely complete daily activities?: Yes Is the patient deaf or have difficulty hearing?: Yes Does the patient have difficulty seeing, even when wearing glasses/contacts?: No Does the patient have difficulty concentrating, remembering, or making decisions?: No Patient able to express need for assistance with ADLs?: Yes Does the patient have difficulty dressing or bathing?: Yes Independently performs ADLs?: No Communication: Independent Dressing (OT): Needs assistance Is this a change from baseline?: Pre-admission baseline Grooming: Independent Feeding: Independent Bathing: Needs assistance Is this a change from baseline?: Pre-admission baseline Toileting: Independent In/Out Bed: Independent Walks in Home: Independent Does the patient have difficulty walking or climbing stairs?: Yes Weakness of Legs: None Weakness of Arms/Hands: None  Permission Sought/Granted                  Emotional Assessment Appearance:: Appears stated age Attitude/Demeanor/Rapport: Engaged, Gracious Affect (typically observed): Accepting, Appropriate, Calm, Pleasant Orientation: : Oriented to Self, Oriented to Place, Oriented to  Time, Oriented to Situation Alcohol / Substance Use: Not Applicable Psych Involvement: No (comment)  Admission diagnosis:  Iron deficiency anemia due to chronic blood loss [D50.0] Lower GI bleed [K92.2] Gastrointestinal hemorrhage, unspecified gastrointestinal hemorrhage type [K92.2] Patient Active Problem List   Diagnosis Date Noted  . Symptomatic anemia 10/27/2019  . Acute blood loss anemia 10/27/2019  . Lower GI bleed 10/27/2019  . Syncope 05/27/2019  .  Nausea & vomiting 05/27/2019  . Essential hypertension 05/27/2019  . AKI (acute kidney injury) ([place])   . PNA (pneumonia) 10/12/2018  . Tobacco use disorder 07/13/2016  . 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 Right upper extremity  numbness   . TIA (transient ischemic attack) 06/27/2016  . Carotid stenosis 04/08/2015  . Migraine 04/06/2015  . Dementia with behavioral disturbance ([place]) 01/19/2015  . Diabetes ([place]) 07/04/2011  . Anxiety 07/04/2011  . COPD (chronic obstructive pulmonary disease) ([place]) 07/04/2011  . Purulent bronchitis ([place]) 07/04/2011  . 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

## 2019-10-28 NOTE — Telephone Encounter (Signed)
In reviewing the notes the last time the patient was seen in our office was 07/07/2019 and she was instructed to stay on ASA and plavix at that time.  She went to the ED on 08/30/2019  the vascular surgeon on call, Dr. Arita Miss, was consulted, who recommended she start xarelto for her upper extremity DVT, in addition to her plavix and ASA.  We have not seen her since that time.  So that is what she was taking with our last contact with her.

## 2019-10-28 NOTE — Telephone Encounter (Signed)
Pam from [facility] [place]  Called to make [place] aware that the pt was recently in the hospital with a clot in her arm and was D/C'd home on Plavix and xarelto . The pt also has a Gi bleed and has to have a blood transfusion.  Pam said that Dr. Astrid Divine would like to know the pt's med's that she takes daily in regards to blood thinners. Please advise.

## 2019-10-28 NOTE — Progress Notes (Signed)
Initial Nutrition Assessment  DOCUMENTATION CODES:   Not applicable  INTERVENTION:   Ensure Max protein supplement BID, each supplement provides 150kcal and 30g of protein.  NUTRITION DIAGNOSIS:   Increased nutrient needs related to chronic illness(COPD) as evidenced by increased estimated needs.  GOAL:   Patient will meet greater than or equal to 90% of their needs  MONITOR:   PO intake, Supplement acceptance, Labs, Weight trends, Skin, I & O's  REASON FOR ASSESSMENT:   Malnutrition Screening Tool    ASSESSMENT:   69 y.o. female with medical history significant for hypertension, diabetes, COPD hepatitis C and depression who is admitted with GIB   Pt reports good appetite and oral intake pta and today. Pt reports that she is eating 100% of meals in hospital. RD discussed with pt the importance of adequate protein needed to build back up hemoglobin. Pt would like to try chocolate Ensure Max. Per chart, pt appears fairly weight stable at baseline. Pt reports a recent 10lb intentional weight loss, since 05/2019. RD will add supplements to help pt meet her estimated needs.   Medications reviewed and include: colace, insulin, protonix  Labs reviewed: Hgb 8.7(L), Hct 28.3(L) cbgs- 137, 186 x 24 hrs AIC 5.6- 4/5  NUTRITION - FOCUSED PHYSICAL EXAM:    Most Recent Value  Orbital Region  No depletion  Upper Arm Region  No depletion  Thoracic and Lumbar Region  No depletion  Buccal Region  No depletion  Temple Region  No depletion  Clavicle Bone Region  No depletion  Clavicle and Acromion Bone Region  No depletion  Scapular Bone Region  No depletion  Dorsal Hand  No depletion  Patellar Region  No depletion  Anterior Thigh Region  No depletion  Posterior Calf Region  No depletion  Edema (RD Assessment)  None  Hair  Reviewed  Eyes  Reviewed  Mouth  Reviewed  Skin  Reviewed  Nails  Reviewed     Diet Order:   Diet Order            Diet Carb Modified Fluid  consistency: Thin; Room service appropriate? Yes  Diet effective now             EDUCATION NEEDS:   Education needs have been addressed  Skin:  Skin Assessment: Reviewed RN Assessment  Last BM:  4/7- TYPE 1  Height:   Ht Readings from Last 1 Encounters:  10/26/19 5\' 1"  (1.549 m)    Weight:   Wt Readings from Last 1 Encounters:  10/26/19 67.6 kg    Ideal Body Weight:  47.7 kg  BMI:  Body mass index is 28.15 kg/m.  Estimated Nutritional Needs:   Kcal:  1500-1700kcal/day  Protein:  75-85g/day  Fluid:  1.4L/day  Koleen Distance MS, RD, LDN Please refer to [facility] for RD and/or RD on-call/weekend/after hours pager

## 2019-10-29 LAB — CBC
HCT: 29.6 % — ABNORMAL LOW (ref 36.0–46.0)
Hemoglobin: 9.3 g/dL — ABNORMAL LOW (ref 12.0–15.0)
MCH: 27.7 pg (ref 26.0–34.0)
MCHC: 31.4 g/dL (ref 30.0–36.0)
MCV: 88.1 fL (ref 80.0–100.0)
Platelets: 314 10*3/uL (ref 150–400)
RBC: 3.36 MIL/uL — ABNORMAL LOW (ref 3.87–5.11)
RDW: 14.4 % (ref 11.5–15.5)
WBC: 7.4 10*3/uL (ref 4.0–10.5)
nRBC: 0 % (ref 0.0–0.2)

## 2019-10-29 LAB — MAGNESIUM: Magnesium: 2.1 mg/dL (ref 1.7–2.4)

## 2019-10-29 LAB — BASIC METABOLIC PANEL
Anion gap: 10 (ref 5–15)
BUN: 26 mg/dL — ABNORMAL HIGH (ref 8–23)
CO2: 24 mmol/L (ref 22–32)
Calcium: 8.8 mg/dL — ABNORMAL LOW (ref 8.9–10.3)
Chloride: 104 mmol/L (ref 98–111)
Creatinine, Ser: 0.79 mg/dL (ref 0.44–1.00)
GFR calc Af Amer: >60 mL/min (ref >60)
GFR calc non Af Amer: >60 mL/min (ref >60)
Glucose, Bld: 166 mg/dL — ABNORMAL HIGH (ref 70–99)
Potassium: 3.7 mmol/L (ref 3.5–5.1)
Sodium: 138 mmol/L (ref 135–145)

## 2019-10-29 LAB — GLUCOSE, CAPILLARY
Glucose-Capillary: 164 mg/dL — ABNORMAL HIGH (ref 70–99)
Glucose-Capillary: 161 mg/dL — ABNORMAL HIGH (ref 70–99)

## 2019-10-29 NOTE — Progress Notes (Signed)
Sallyanne Havers  A and O x 4 VSS. Pt tolerating diet well. No complaints of pain or nausea. IV removed intact, prescriptions given. Pt voices understanding of discharge instructions with no further questions. Pt discharged via wheelchair with axillary.   Allergies as of 10/29/2019      Reactions   Promethazine Other (See Comments)   studdering Other Reaction: IMPAIRS SPEECH, UNABLE TO VERB studdering Other Reaction: IMPAIRS SPEECH, UNABLE TO VERB   Sumatriptan    Other reaction(s): Other (See Comments) Other Reaction: PANIC ATTACK   Ciprofloxacin Other (See Comments)   Medication is contraindicated with some of pts other meds.   Delirium Delirium   Doxycycline Nausea And Vomiting   Imitrex [sumatriptan Base] Other (See Comments)   Pt states that it "puts her out of this world".     Morphine And Related Nausea And Vomiting   Penicillins Nausea And Vomiting, Other (See Comments)   Pt is unable to answer additional questions about this medication.     Ranitidine Hcl Other (See Comments)   studdering studdering unknown   Zantac [ranitidine Hcl] Other (See Comments)   Reaction:  Unknown    Ketorolac Tromethamine Rash      Medication List    STOP taking these medications   Aspirin Low Dose 81 MG EC tablet Generic drug: aspirin   clopidogrel 75 MG tablet Commonly known as: PLAVIX   Xarelto 20 MG Tabs tablet Generic drug: rivaroxaban     TAKE these medications   acetaminophen 500 MG tablet Commonly known as: TYLENOL Take 1,000 mg by mouth every 8 (eight) hours as needed for pain.   amLODipine 10 MG tablet Commonly known as: NORVASC Take 10 mg by mouth daily.   atorvastatin 80 MG tablet Commonly known as: LIPITOR Take 80 mg by mouth daily.   busPIRone 15 MG tablet Commonly known as: BUSPAR Take 15 mg by mouth 3 (three) times daily.   clonazePAM 1 MG tablet Commonly known as: KLONOPIN Take 1 mg by mouth 2 (two) times daily.   diclofenac sodium 1 % Gel Commonly known  as: VOLTAREN Apply 2 g topically 4 (four) times daily.   DULoxetine 60 MG capsule Commonly known as: CYMBALTA Take 60 mg by mouth daily.   FerrouSul 325 (65 FE) MG tablet Generic drug: ferrous sulfate Take 325 mg by mouth daily with breakfast.   glipiZIDE 5 MG tablet Commonly known as: GLUCOTROL Take 2.5-5 mg by mouth 2 (two) times daily before a meal. Take one tablet (5mg ) by mouth at breakfast and one-half tablet (2.5mg ) at lunch   metFORMIN 500 MG tablet Commonly known as: GLUCOPHAGE Take 500 mg by mouth 2 (two) times daily.   pantoprazole 40 MG tablet Commonly known as: PROTONIX Take 40 mg by mouth daily.   QUEtiapine 25 MG tablet Commonly known as: SEROQUEL Take 25 mg by mouth 3 (three) times daily. Take one tablet (25 mg) by mouth every morning, one tablet (25 mg) at noon and two tablets (50mg ) at bedtime   traZODone 50 MG tablet Commonly known as: DESYREL Take 50 mg by mouth at bedtime.   ziprasidone 20 MG capsule Commonly known as: GEODON Take 20 mg by mouth 2 (two) times daily.       Vitals:   10/29/19 0428 10/29/19 1249  BP: (!) 169/58 (!) 133/47  Pulse: 79 83  Resp: 20 16  Temp: 98.3 F (36.8 C) 98.2 F (36.8 C)  SpO2: 98% 98%    Jeffie Spivack Payton Mccallum

## 2019-10-29 NOTE — Discharge Summary (Signed)
Physician Discharge Summary  Tammy Armstrong [id number] DOB: [birth year] DOA: 10/27/2019  PCP: Gayland Curry, MD  Admit date: 10/27/2019 Discharge date: 10/29/2019  Admitted From: Home  Disposition:  Home   Recommendations for Outpatient Follow-up:  1. Follow up with GI Dr. Vicente Males in 1 week 2. Follow up with Dr. Lucky Cowboy in 4 weeks 3. Follow up with Dr. Astrid Divine PCP in 1-2 weeks 4. Dr. Astrid Divine or Dr. Vicente Males: Please send new script for Xarelto to patient's pharmacy after endoscopy so that they can repackage it for her in blister packs 5. Please obtain CBC in 1 week    Home Health: None  Equipment/Devices: None  Discharge Condition: Good  CODE STATUS: FULL Diet recommendation: Regular  Brief/Interim Summary: Tammy Armstrong a 69 y.o.Caucasian femalewith medical history significant forhypertension, diabetes, COPD hepatitis C and depression who presented to the emergency room after her PCP said her hemoglobin was 5.6. Patient states that for the past 2 weeks she has been having intermittent bright red blood per rectum.       PRINCIPAL HOSPITAL DIAGNOSIS: Hematochezia    Discharge Diagnoses:   Lower GI bleed Symptomatic anemia Acute blood loss anemia Pt was on ASA, Xarelto and Plavix prior to presentation.  Aspirin, Xarelto, and Plavix were all stopped.  The patient was transfused 2 units of packed red blood cells.  Her hematochezia completely ceased, her hemoglobin came up and stabilized, and she had no further signs of instability or ongoing bleeding.  EGD will be arranged in a short interval with GI.  The patient should hold aspirin, Xarelto, and Plavix for endoscopy.  After endoscopy she should resume Xarelto to complete 3 months total from the time of diagnosis of a DVT.  After that she should stop Xarelto and resume baby aspirin alone under the direction of Dr. Laurence Compton.   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 Loleta Books: You were admitted for rectal bleeding.   This is common, and to be sure what it was from, you need endoscopy.  Thankfully, the bleeding has stopped, and not restarted, and so the safest thing to do would be to go home and follow up with Dr. Vicente Males. Dr. Georgeann Oppenheim office will arrange the endoscopy and call you.  For now:  DO NOT TAKE aspirin, clopidogrel/Plavix, or rivaroxaban/Xarelto  After your endoscopy, resume the Xarelto.  You should then take Xarelto to complete 3 months from when you started in February  After you complete 3 months of Xarelto, you should switch back to taking aspirin 81 mg daily alone  Follow up with Dr. Lucky Cowboy to review this with him   Increase activity slowly   Complete by: As directed      Allergies as of 10/29/2019      Reactions   Promethazine Other (See Comments)   studdering Other Reaction: IMPAIRS SPEECH, UNABLE TO VERB studdering Other Reaction: IMPAIRS SPEECH, UNABLE TO VERB   Sumatriptan    Other reaction(s): Other (See Comments) Other Reaction: PANIC ATTACK   Ciprofloxacin Other (See Comments)   Medication is contraindicated with some of pts other meds.   Delirium Delirium  Doxycycline Nausea And Vomiting   Imitrex [sumatriptan Base] Other (See Comments)   Pt states that it "puts her out of this world".     Morphine And Related Nausea And Vomiting   Penicillins Nausea And Vomiting, Other (See Comments)   Pt is unable to answer additional questions about this medication.     Ranitidine Hcl Other (See Comments)   studdering studdering unknown   Zantac [ranitidine Hcl] Other (See Comments)   Reaction:  Unknown    Ketorolac Tromethamine Rash      Medication  List    STOP taking these medications   Aspirin Low Dose 81 MG EC tablet Generic drug: aspirin   clopidogrel 75 MG tablet Commonly known as: PLAVIX   Xarelto 20 MG Tabs tablet Generic drug: rivaroxaban     TAKE these medications   acetaminophen 500 MG tablet Commonly known as: TYLENOL Take 1,000 mg by mouth every 8 (eight) hours as needed for pain.   amLODipine 10 MG tablet Commonly known as: NORVASC Take 10 mg by mouth daily.   atorvastatin 80 MG tablet Commonly known as: LIPITOR Take 80 mg by mouth daily.   busPIRone 15 MG tablet Commonly known as: BUSPAR Take 15 mg by mouth 3 (three) times daily.   clonazePAM 1 MG tablet Commonly known as: KLONOPIN Take 1 mg by mouth 2 (two) times daily.   diclofenac sodium 1 % Gel Commonly known as: VOLTAREN Apply 2 g topically 4 (four) times daily.   DULoxetine 60 MG capsule Commonly known as: CYMBALTA Take 60 mg by mouth daily.   FerrouSul 325 (65 FE) MG tablet Generic drug: ferrous sulfate Take 325 mg by mouth daily with breakfast.   glipiZIDE 5 MG tablet Commonly known as: GLUCOTROL Take 2.5-5 mg by mouth 2 (two) times daily before a meal. Take one tablet (5mg ) by mouth at breakfast and one-half tablet (2.5mg ) at lunch   metFORMIN 500 MG tablet Commonly known as: GLUCOPHAGE Take 500 mg by mouth 2 (two) times daily.   pantoprazole 40 MG tablet Commonly known as: PROTONIX Take 40 mg by mouth daily.   QUEtiapine 25 MG tablet Commonly known as: SEROQUEL Take 25 mg by mouth 3 (three) times daily. Take one tablet (25 mg) by mouth every morning, one tablet (25 mg) at noon and two tablets (50mg ) at bedtime   traZODone 50 MG tablet Commonly known as: DESYREL Take 50 mg by mouth at bedtime.   ziprasidone 20 MG capsule Commonly known as: GEODON Take 20 mg by mouth 2 (two) times daily.      Follow-up Information    Gayland Curry, MD. Go on 11/03/2019.   Specialty: Family Medicine Why: 9am Contact  information: [place] [address] [phone]        Jonathon Bellows, MD. Go on 12/01/2019.   Specialty: Gastroenterology Why: Juliane Lack information: [place] [address] [phone]        Algernon Huxley, MD. Schedule an appointment as soon as possible for a visit in 1 month.   Specialties: Vascular Surgery, Radiology, Interventional Cardiology Why: Office will call patient back. Contact information: [place] [address] [phone]          Allergies  Allergen Reactions  . Promethazine Other (See Comments)    studdering Other Reaction: IMPAIRS SPEECH, UNABLE TO VERB studdering Other Reaction: IMPAIRS SPEECH, UNABLE TO VERB   . Sumatriptan     Other reaction(s): Other (See Comments)  Other Reaction: PANIC ATTACK  . Ciprofloxacin Other (See Comments)    Medication is contraindicated with some of pts other meds.   Delirium Delirium   . Doxycycline Nausea And Vomiting  . Imitrex [Sumatriptan Base] Other (See Comments)    Pt states that it "puts her out of this world".    . Morphine And Related Nausea And Vomiting  . Penicillins Nausea And Vomiting and Other (See Comments)    Pt is unable to answer additional questions about this medication.    . Ranitidine Hcl Other (See Comments)    studdering studdering unknown   . Zantac [Ranitidine Hcl] Other (See Comments)    Reaction:  Unknown   . Ketorolac Tromethamine Rash    Consultations:  Gastroenterology   Procedures/Studies: CT Angio Abd/Pel W and/or Wo Contrast  Result Date: 10/27/2019 CLINICAL DATA:  GI bleed. EXAM: CTA ABDOMEN AND PELVIS WITHOUT AND WITH CONTRAST TECHNIQUE: Multidetector CT imaging of the abdomen and pelvis was performed using the standard protocol during bolus administration of intravenous contrast. Multiplanar reconstructed images and MIPs were obtained and reviewed to evaluate the vascular anatomy. CONTRAST:  159mL  OMNIPAQUE IOHEXOL 350 MG/ML SOLN COMPARISON:  CT of the pelvis dated May 27, 2019 FINDINGS: VASCULAR Aorta: There are atherosclerotic changes throughout the abdominal aorta without evidence for an aneurysm. Celiac: There are atherosclerotic changes at the origin of the celiac axis resulting in mild stenosis. SMA: There is a high-grade stenosis of the proximal SMA. The mid distal SMA are patent. Renals: There is a single right renal artery with moderate atherosclerotic changes at the origin. There is a single left renal artery with a moderate to high-grade stenosis at the origin. IMA: Patent without evidence of aneurysm, dissection, vasculitis or significant stenosis. Inflow: There is moderate narrowing of the bilateral common iliac arteries proximally. There are atherosclerotic changes throughout the bilateral external iliac arteries. Proximal Outflow: Bilateral common femoral and visualized portions of the superficial and profunda femoral arteries are patent without evidence of aneurysm, dissection, vasculitis or significant stenosis. Veins: No obvious venous abnormality within the limitations of this arterial phase study. Review of the MIP images confirms the above findings. NON-VASCULAR Lower chest: The lung bases are clear. The heart size is normal. Hepatobiliary: The liver surface appears somewhat nodular, raising concern for underlying cirrhosis. Status post cholecystectomy.There is no biliary ductal dilation. Pancreas: Normal contours without ductal dilatation. No peripancreatic fluid collection. Spleen: Unremarkable. Adrenals/Urinary Tract: --Adrenal glands: Unremarkable. --Right kidney/ureter: No hydronephrosis or radiopaque kidney stones. --Left kidney/ureter: Left kidney is slightly atrophic when compared to right. This is felt to be secondary to vascular compromise at the origin of the left renal artery. --Urinary bladder: The urinary bladder is decompressed which limits evaluation. Stomach/Bowel:  --Stomach/Duodenum: The stomach is moderately distended. --Small bowel: Unremarkable. --Colon: There are few scattered colonic diverticula without CT evidence for diverticulitis. --Appendix: Not visualized. No right lower quadrant inflammation or free fluid. Lymphatic: --No retroperitoneal lymphadenopathy. --No mesenteric lymphadenopathy. --No pelvic or inguinal lymphadenopathy. Reproductive: Status post hysterectomy. No adnexal mass. Other: No ascites or free air. The abdominal wall is normal. Musculoskeletal. There are degenerative changes throughout the lumbar spine without evidence for displaced fracture. IMPRESSION: VASCULAR 1. No evidence for active arterial GI bleeding. 2. Chronic vascular disease as detailed above. 3. Atherosclerotic changes of the abdominal aorta without evidence for an aneurysm. NON-VASCULAR 1. The liver surface appears somewhat nodular, raising concern for underlying cirrhosis. 2. Colonic diverticulosis without evidence of acute diverticulitis. 3. Aortic Atherosclerosis (ICD10-I70.0). Electronically  Signed   By: Constance Holster M.D.   On: 10/27/2019 02:05       Subjective: Patient has had several brown bowel movements.  She has had no dizziness with standing, no melena, no vomiting, no heme or hematochezia.  Discharge Exam: Vitals:   10/29/19 0428 10/29/19 1249  BP: (!) 169/58 (!) 133/47  Pulse: 79 83  Resp: 20 16  Temp: 98.3 F (36.8 C) 98.2 F (36.8 C)  SpO2: 98% 98%   Vitals:   10/28/19 1135 10/28/19 2047 10/29/19 0428 10/29/19 1249  BP: (!) 135/55 (!) 151/54 (!) 169/58 (!) 133/47  Pulse: 76 75 79 83  Resp: 15 20 20 16   Temp: 98.5 F (36.9 C) 98.6 F (37 C) 98.3 F (36.8 C) 98.2 F (36.8 C)  TempSrc: Oral Oral Oral Oral  SpO2: 100% 100% 98% 98%  Weight:      Height:        General: Pt is alert, awake, not in acute distress Cardiovascular: RRR, nl S1-S2, no murmurs appreciated.   No LE edema.   Respiratory: Normal respiratory rate and rhythm.   CTAB without rales or wheezes. Abdominal: Abdomen soft and non-tender.  No distension or HSM.   Neuro/Psych: Strength symmetric in upper and lower extremities.  Judgment and insight appear normal.   The results of significant diagnostics from this hospitalization (including imaging, microbiology, ancillary and laboratory) are listed below for reference.     Microbiology: Recent Results (from the past 240 hour(s))  SARS CORONAVIRUS 2 (TAT 6-24 HRS) Nasopharyngeal Nasopharyngeal Swab     Status: None   Collection Time: 10/27/19  9:36 AM   Specimen: Nasopharyngeal Swab  Result Value Ref Range Status   SARS Coronavirus 2 NEGATIVE NEGATIVE Final    Comment: (NOTE) SARS-CoV-2 target nucleic acids are NOT DETECTED. The SARS-CoV-2 RNA is generally detectable in upper and lower respiratory specimens during the acute phase of infection. Negative results do not preclude SARS-CoV-2 infection, do not rule out co-infections with other pathogens, and should not be used as the sole basis for treatment or other patient management decisions. Negative results must be combined with clinical observations, patient history, and epidemiological information. The expected result is Negative. Fact Sheet for Patients: SugarRoll.be Fact Sheet for Healthcare Providers: https://www.woods-mathews.com/ This test is not yet approved or cleared by the [place] FDA and  has been authorized for detection and/or diagnosis of SARS-CoV-2 by FDA under an Emergency Use Authorization (EUA). This EUA will remain  in effect (meaning this test can be used) for the duration of the COVID-19 declaration under Section 56 4(b)(1) of the Act, 21 U.S.C. section 360bbb-3(b)(1), unless the authorization is terminated or revoked sooner. Performed at [facility], [place] [address] [place], [place] [address]      Labs: BNP (last 3 results) No results for input(s): BNP in the last  8760 hours. Basic Metabolic Panel: Recent Labs  Lab 10/26/19 2138 10/28/19 0447 10/29/19 0432  NA 138 138 138  K 3.5 4.1 3.7  CL 108 108 104  CO2 21* 25 24  GLUCOSE 146* 119* 166*  BUN 11 14 26*  CREATININE 0.90 0.73 0.79  CALCIUM 8.5* 8.4* 8.8*  MG  --  1.9 2.1   Liver Function Tests: No results for input(s): AST, ALT, ALKPHOS, BILITOT, PROT, ALBUMIN in the last 168 hours. No results for input(s): LIPASE, AMYLASE in the last 168 hours. No results for input(s): AMMONIA in the last 168 hours. CBC: Recent Labs  Lab  10/26/19 2138 10/27/19 0612 10/27/19 0937 10/28/19 0447 10/29/19 0432  WBC 9.8 8.3 7.3 6.2 7.4  NEUTROABS 5.7 5.1  --   --   --   HGB 5.3* 6.7* 8.9* 8.7* 9.3*  HCT 19.0* 22.3* 28.2* 28.3* 29.6*  MCV 95.5 92.5 88.7 90.1 88.1  PLT 367 308 309 302 314   Cardiac Enzymes: No results for input(s): CKTOTAL, CKMB, CKMBINDEX, TROPONINI in the last 168 hours. BNP: Invalid input(s): POCBNP CBG: Recent Labs  Lab 10/28/19 1136 10/28/19 1651 10/28/19 2117 10/29/19 0731 10/29/19 1140  GLUCAP 186* 166* 156* 164* 161*   D-Dimer No results for input(s): DDIMER in the last 72 hours. Hgb A1c Recent Labs    10/26/19 2138  HGBA1C 5.6   Lipid Profile No results for input(s): CHOL, HDL, LDLCALC, TRIG, CHOLHDL, LDLDIRECT in the last 72 hours. Thyroid function studies No results for input(s): TSH, T4TOTAL, T3FREE, THYROIDAB in the last 72 hours.  Invalid input(s): FREET3 Anemia work up No results for input(s): VITAMINB12, FOLATE, FERRITIN, TIBC, IRON, RETICCTPCT in the last 72 hours. Urinalysis    Component Value Date/Time   COLORURINE YELLOW (A) 05/27/2019 0218   APPEARANCEUR CLEAR (A) 05/27/2019 0218   APPEARANCEUR Clear 07/10/2014 1749   LABSPEC 1.025 05/27/2019 0218   LABSPEC 1.025 07/10/2014 1749   PHURINE 5.0 05/27/2019 0218   GLUCOSEU NEGATIVE 05/27/2019 0218   GLUCOSEU Negative 07/10/2014 1749   HGBUR NEGATIVE 05/27/2019 0218   BILIRUBINUR NEGATIVE  05/27/2019 0218   BILIRUBINUR Negative 07/10/2014 1749   KETONESUR NEGATIVE 05/27/2019 0218   PROTEINUR 30 (A) 05/27/2019 0218   UROBILINOGEN 1.0 07/02/2011 1005   NITRITE NEGATIVE 05/27/2019 0218   LEUKOCYTESUR NEGATIVE 05/27/2019 0218   LEUKOCYTESUR Negative 07/10/2014 1749   Sepsis Labs Invalid input(s): PROCALCITONIN,  WBC,  LACTICIDVEN Microbiology Recent Results (from the past 240 hour(s))  SARS CORONAVIRUS 2 (TAT 6-24 HRS) Nasopharyngeal Nasopharyngeal Swab     Status: None   Collection Time: 10/27/19  9:36 AM   Specimen: Nasopharyngeal Swab  Result Value Ref Range Status   SARS Coronavirus 2 NEGATIVE NEGATIVE Final    Comment: (NOTE) SARS-CoV-2 target nucleic acids are NOT DETECTED. The SARS-CoV-2 RNA is generally detectable in upper and lower respiratory specimens during the acute phase of infection. Negative results do not preclude SARS-CoV-2 infection, do not rule out co-infections with other pathogens, and should not be used as the sole basis for treatment or other patient management decisions. Negative results must be combined with clinical observations, patient history, and epidemiological information. The expected result is Negative. Fact Sheet for Patients: SugarRoll.be Fact Sheet for Healthcare Providers: https://www.woods-mathews.com/ This test is not yet approved or cleared by the [place] FDA and  has been authorized for detection and/or diagnosis of SARS-CoV-2 by FDA under an Emergency Use Authorization (EUA). This EUA will remain  in effect (meaning this test can be used) for the duration of the COVID-19 declaration under Section 56 4(b)(1) of the Act, 21 U.S.C. section 360bbb-3(b)(1), unless the authorization is terminated or revoked sooner. Performed at [facility], [place] [address] [place], [place] [address]      Time coordinating discharge: 35 minutes The [place] controlled substances registry was  reviewed for this patient      SIGNED:   Edwin Dada, MD  [facility] 10/29/2019, 5:23 PM

## 2019-10-29 NOTE — TOC Transition Note (Signed)
Transition of Care [facility]) - CM/SW Discharge Note   Patient Details  Name: Tammy Armstrong MRN: [id number] Date of Birth: 11/22/1950  [facility] CM/SW Contact:  Candie Chroman, LCSW Phone Number: 10/29/2019, 1:35 PM   Clinical Narrative: Patient has orders to discharge home today. She has no local family to take her home and her peer-support specialist's car broke down. CSW confirmed address and filled out cab voucher. No further concerns. CSW signing off.    Final next level of care: [facility](Has PCS aide.) Barriers to Discharge: Barriers Resolved   Patient Goals and CMS Choice     Choice offered to / list presented to : NA  Discharge Placement                Patient to be transferred to facility by: Taxi   Patient and family notified of of transfer: 10/29/19  Discharge Plan and Services     Post Acute Care Choice: Resumption of Svcs/PTA Provider                               Social Determinants of Health (SDOH) Interventions     Readmission Risk Interventions Readmission Risk Prevention Plan 10/28/2019  Transportation Screening Complete  PCP or Specialist Appt within 3-5 Days Complete  HRI or [place] Complete  Social Work Consult for [place] Planning/Counseling Complete  Palliative Care Screening Not Applicable  Medication Review [employer]) Complete  Some recent data might be hidden

## 2019-11-03 ENCOUNTER — Telehealth: Payer: Self-pay

## 2019-11-03 NOTE — Telephone Encounter (Signed)
Called pt to schedule EGD/Colonoscopy and to discuss blood thinner medication holding.  Unable to contact, LVM to return call

## 2019-11-15 ENCOUNTER — Emergency Department
Admission: EM | Admit: 2019-11-15 | Discharge: 2019-11-15 | Discharge status: Against Medical Advice | Payer: Medicare Other

## 2019-11-15 NOTE — ED Notes (Signed)
Pt arrives via EMS with HTN. Per EMS, BP 187/78 with all other VS WDL. Pt is in NAD.

## 2019-11-15 NOTE — ED Notes (Signed)
Pt states that I am not going to wait out here for four hours. Pt denies any concerns at this time and has called herself a cab.

## 2019-11-18 NOTE — Telephone Encounter (Signed)
Called pt again to schedule endoscopy procedure.  Unable to contact, LVM to return call

## 2019-11-27 ENCOUNTER — Ambulatory Visit (INDEPENDENT_AMBULATORY_CARE_PROVIDER_SITE_OTHER): Payer: Medicare Other | Admitting: Vascular Surgery

## 2019-12-01 ENCOUNTER — Ambulatory Visit: Payer: Medicare Other | Admitting: Gastroenterology

## 2019-12-08 ENCOUNTER — Other Ambulatory Visit: Payer: Self-pay

## 2019-12-08 ENCOUNTER — Encounter (INDEPENDENT_AMBULATORY_CARE_PROVIDER_SITE_OTHER): Payer: Self-pay | Admitting: Nurse Practitioner

## 2019-12-08 ENCOUNTER — Ambulatory Visit (INDEPENDENT_AMBULATORY_CARE_PROVIDER_SITE_OTHER): Payer: Medicare Other | Admitting: Nurse Practitioner

## 2019-12-08 ENCOUNTER — Ambulatory Visit (INDEPENDENT_AMBULATORY_CARE_PROVIDER_SITE_OTHER): Payer: Medicare Other

## 2019-12-08 VITALS — BP 131/67 | HR 73 | Ht 61.0 in | Wt 150.0 lb

## 2019-12-08 DIAGNOSIS — I6523 Occlusion and stenosis of bilateral carotid arteries: Secondary | ICD-10-CM

## 2019-12-08 DIAGNOSIS — I10 Essential (primary) hypertension: Secondary | ICD-10-CM

## 2019-12-08 DIAGNOSIS — F17200 Nicotine dependence, unspecified, uncomplicated: Secondary | ICD-10-CM | POA: Diagnosis not present

## 2019-12-08 DIAGNOSIS — I82722 Chronic embolism and thrombosis of deep veins of left upper extremity: Secondary | ICD-10-CM

## 2019-12-08 NOTE — Progress Notes (Signed)
Subjective:    Patient ID: Tammy Armstrong, female    DOB: [birth year], 69 y.o.   MRN: [id number] Chief Complaint  Patient presents with  . Follow-up    U/S follow up     The patient is seen for follow up evaluation of carotid stenosis. The carotid stenosis followed by ultrasound.  The patient previously had a carotid endarterectomy done in 2017.  The patient has been lost to our office since 2017.  However we did see the patient incidentally has a consult wound that was believed to be a left brachial vein thrombus.  The patient was placed on Xarelto in addition to her Plavix and aspirin at that time.  About 3 months or so after that the patient notes that she was on her Xarelto due to a GI bleed.  He denies any chest pain or shortness of breath.  She denies any swelling of her upper extremity.  The patient denies amaurosis fugax. There is no recent history of TIA symptoms or focal motor deficits. There is no prior documented CVA.  The patient is taking enteric-coated aspirin 81 mg daily.  Patient continues to smoke.  There is no history of migraine headaches. There is no history of seizures.  The patient has a history of coronary artery disease, no recent episodes of angina or shortness of breath. The patient denies PAD or claudication symptoms. There is a history of hyperlipidemia which is being treated with a statin.    Carotid Duplex done today shows 1 to 39% stenosis bilaterally with a patent endarterectomy site on the left.   Review of Systems     Objective:   Physical Exam Vitals reviewed.  Constitutional:      Appearance: Normal appearance.  HENT:     Head: Normocephalic.  Cardiovascular:     Rate and Rhythm: Normal rate and regular rhythm.     Pulses:          Carotid pulses are on the right side with bruit.    Heart sounds: Murmur present.  Pulmonary:     Effort: Pulmonary effort is normal.  Skin:    General: Skin is warm.  Neurological:     Mental Status: She is  alert and oriented to person, place, and time.  Psychiatric:        Mood and Affect: Mood normal.        Behavior: Behavior normal.        Thought Content: Thought content normal.        Judgment: Judgment normal.     BP 131/67   Pulse 73   Ht 5\' 1"  (1.549 m)   Wt 150 lb (68 kg)   BMI 28.34 kg/m   Past Medical History:  Diagnosis Date  . Anxiety   . Arthritis   . Carotid artery occlusion   . Cigarette smoker one half pack a day or less   . COPD (chronic obstructive pulmonary disease) ([place]) 07/04/2011  . Depression   . Diabetes mellitus   . Diabetes mellitus 07/04/2011  . GERD (gastroesophageal reflux disease)   . Hepatitis C   . Hypercholesterolemia   . Hypertension   . Migraines   . Pancreatitis   . 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 Respiratory failure ([place]) 07/04/2011  . Seizure ([place]) 07/02/2011  . Shortness of breath dyspnea   . Stroke [facility])     Social History   Socioeconomic History  . Marital status: Single    Spouse name: Not on  file  . Number of children: 3  . Years of education: Not on file  . Highest education level: Not on file  Occupational History  . Not on file  Tobacco Use  . Smoking status: Current Every Day Smoker    Packs/day: 0.50    Years: 52.00    Pack years: 26.00    Types: Cigarettes  . Smokeless tobacco: Never Used  Substance and Sexual Activity  . Alcohol use: No  . Drug use: No  . Sexual activity: Not Currently  Other Topics Concern  . Not on file  Social History Narrative  . Not on file   Social Determinants of Health   Financial Resource Strain:   . Difficulty of Paying Living Expenses:   Food Insecurity:   . Worried About Charity fundraiser in the Last Year:   . Arboriculturist in the Last Year:   Transportation Needs:   . Film/video editor (Medical):   Marland Kitchen Lack of Transportation (Non-Medical):   Physical Activity:   . Days of Exercise per Week:   . Minutes of Exercise per Session:   Stress:   .  Feeling of Stress :   Social Connections:   . Frequency of Communication with Friends and Family:   . Frequency of Social Gatherings with Friends and Family:   . Attends Religious Services:   . Active Member of Clubs or Organizations:   . Attends Archivist Meetings:   Marland Kitchen Marital Status:   Intimate Partner Violence:   . Fear of Current or Ex-Partner:   . Emotionally Abused:   Marland Kitchen Physically Abused:   . Sexually Abused:     Past Surgical History:  Procedure Laterality Date  . ABDOMINAL HYSTERECTOMY    . APPENDECTOMY    . BACK SURGERY  2012   cyst removed from spine , [place]  . CHOLECYSTECTOMY    . ENDARTERECTOMY Left 05/18/2015   Procedure: ENDARTERECTOMY CAROTID;  Surgeon: Algernon Huxley, MD;  Location: [facility] ORS;  Service: Vascular;  Laterality: Left;    Family History  Problem Relation Age of Onset  . Hypertension Mother   . Heart attack Mother   . Diabetes Mother   . Anxiety disorder Mother   . Stroke Mother   . Diabetes Father   . Diabetes Brother   . Alcohol abuse Other        siblings  . Hypertension Brother   . Breast cancer Neg Hx     Allergies  Allergen Reactions  . Promethazine Other (See Comments)    studdering Other Reaction: IMPAIRS SPEECH, UNABLE TO VERB studdering Other Reaction: IMPAIRS SPEECH, UNABLE TO VERB   . Sumatriptan     Other reaction(s): Other (See Comments) Other Reaction: PANIC ATTACK  . Ciprofloxacin Other (See Comments)    Medication is contraindicated with some of pts other meds.   Delirium Delirium   . Doxycycline Nausea And Vomiting  . Imitrex [Sumatriptan Base] Other (See Comments)    Pt states that it "puts her out of this world".    . Morphine And Related Nausea And Vomiting  . Penicillins Nausea And Vomiting and Other (See Comments)    Pt is unable to answer additional questions about this medication.    . Ranitidine Hcl Other (See Comments)    studdering studdering unknown   . Zantac [Ranitidine Hcl]  Other (See Comments)    Reaction:  Unknown   . Ketorolac Tromethamine Rash  Assessment & Plan:   1. Bilateral carotid artery stenosis Recommend:  Given the patient's asymptomatic subcritical stenosis no further invasive testing or surgery at this time.  Duplex ultrasound shows 39% stenosis bilaterally with a patent left endarterectomy site  Continue antiplatelet therapy as prescribed Continue management of CAD, HTN and Hyperlipidemia Healthy heart diet,  encouraged exercise at least 4 times per week Follow up in 12 months with duplex ultrasound and physical exam   2. Tobacco use disorder Smoking cessation was discussed, 3-10 minutes spent on this topic specifically   3. Essential hypertension Continue antihypertensive medications as already ordered, these medications have been reviewed and there are no changes at this time.   4. 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  At this time the patient did not have any swelling or issues with her left upper extremity.  The previous ultrasound indicates that the thrombus was likely chronic at that time.  Patient will continue with aspirin and Plavix at this time.  Current Outpatient Medications on File Prior to Visit  Medication Sig Dispense Refill  . acetaminophen (TYLENOL) 500 MG tablet Take 1,000 mg by mouth every 8 (eight) hours as needed for pain.    Marland Kitchen albuterol (VENTOLIN HFA) 108 (90 Base) MCG/ACT inhaler     . amLODipine (NORVASC) 10 MG tablet Take 10 mg by mouth daily.    Marland Kitchen atorvastatin (LIPITOR) 80 MG tablet Take 80 mg by mouth daily.    . busPIRone (BUSPAR) 15 MG tablet Take 15 mg by mouth 3 (three) times daily.     . DULoxetine (CYMBALTA) 60 MG capsule Take 60 mg by mouth daily.     . ferrous sulfate (FERROUSUL) 325 (65 FE) MG tablet Take 325 mg by mouth daily with breakfast.    . glipiZIDE  (GLUCOTROL) 5 MG tablet Take 2.5-5 mg by mouth 2 (two) times daily before a meal. Take one tablet (5mg ) by mouth at breakfast and one-half tablet (2.5mg ) at lunch    . metFORMIN (GLUCOPHAGE) 500 MG tablet Take 500 mg by mouth 2 (two) times daily.    . pantoprazole (PROTONIX) 40 MG tablet Take 40 mg by mouth daily.     . QUEtiapine (SEROQUEL) 25 MG tablet Take 25 mg by mouth 3 (three) times daily. Take one tablet (25 mg) by mouth every morning, one tablet (25 mg) at noon and two tablets (50mg ) at bedtime    . clonazePAM (KLONOPIN) 1 MG tablet Take 1 mg by mouth 2 (two) times daily.    . diclofenac sodium (VOLTAREN) 1 % GEL Apply 2 g topically 4 (four) times daily.    . traZODone (DESYREL) 50 MG tablet Take 50 mg by mouth at bedtime.    . ziprasidone (GEODON) 20 MG capsule Take 20 mg by mouth 2 (two) times daily.     No current facility-administered medications on file prior to visit.    There are no Patient Instructions on file for this visit. No follow-ups on file.   Kris Hartmann, NP

## 2019-12-15 ENCOUNTER — Ambulatory Visit (INDEPENDENT_AMBULATORY_CARE_PROVIDER_SITE_OTHER): Payer: Medicare Other | Admitting: Vascular Surgery

## 2020-01-07 ENCOUNTER — Ambulatory Visit: Payer: Medicare Other | Admitting: Gastroenterology

## 2020-02-15 ENCOUNTER — Other Ambulatory Visit: Payer: Self-pay

## 2020-02-16 ENCOUNTER — Other Ambulatory Visit: Payer: Self-pay

## 2020-02-16 ENCOUNTER — Ambulatory Visit (INDEPENDENT_AMBULATORY_CARE_PROVIDER_SITE_OTHER): Payer: Medicare Other | Admitting: Gastroenterology

## 2020-02-16 VITALS — BP 128/67 | HR 61 | Ht 61.0 in | Wt 163.0 lb

## 2020-02-16 DIAGNOSIS — K7460 Unspecified cirrhosis of liver: Secondary | ICD-10-CM

## 2020-02-16 DIAGNOSIS — K625 Hemorrhage of anus and rectum: Secondary | ICD-10-CM

## 2020-02-16 DIAGNOSIS — I6523 Occlusion and stenosis of bilateral carotid arteries: Secondary | ICD-10-CM

## 2020-02-16 MED ORDER — OMEPRAZOLE 20 MG PO CPDR: 20.0000 mg | DELAYED_RELEASE_CAPSULE | Freq: Every day | ORAL | 0 refills | Status: DC

## 2020-02-16 MED ORDER — NA SULFATE-K SULFATE-MG SULF 17.5-3.13-1.6 GM/177ML PO SOLN: 1.0000 | Freq: Once | ORAL | 0 refills | Status: AC

## 2020-02-16 NOTE — Progress Notes (Signed)
Jonathon Bellows MD, MRCP(U.K) [address]  [place]  [place], [place] [address]  Main: [phone]  Fax: [phone]   Primary Care Physician: Gayland Curry, MD  Primary Gastroenterologist:  Dr. Jonathon Bellows   Follow-up for GI bleed  HPI: Tammy Armstrong is a 69 y.o. female   Summary of history :  She was initially referred and seen by GI bleed in April 2021 when she was admitted on 10/27/2019.  She presented to the emergency room after an incidental finding of hemoglobin of 5.6 g.  Did have a history of rectal bleeding on and off for about a week..  CT angiogram performed in the emergency room showed features suggestive of cirrhosis but diverticulosis of the colon was also noted.  2 months prior hemoglobin of 13.5 g.  She was on Plavix and Xarelto.  Recent DVT in the right arm.  Plan was for an EGD and colonoscopy.  Plan was for an outpatient EGD off Plavix and Xarelto.  Our office tried contacting her to schedule her endoscopy but were not successful in doing so. Interval history   April 2021-02/16/2020  02/08/2020: Hemoglobin 11.6 g.  Iron studies are normal normal. Since hospital discharge she has been doing well.  No rectal bleeding.  Denies any NSAID use.  She is not on Plavix at this time.  Only on aspirin.  She says he has used cocaine many years back.  None recently.  Denies any excess alcohol use.  She has had 2 tattoos.  No known liver disease.  She was heavier in the past and her maximum she weighed 200 pounds.  Presently 163 pounds.  Presently BMI 30.  No other complaints.  Never had a colonoscopy.  Current Outpatient Medications  Medication Sig Dispense Refill  . acetaminophen (TYLENOL) 500 MG tablet Take 1,000 mg by mouth every 8 (eight) hours as needed for pain.    Marland Kitchen albuterol (VENTOLIN HFA) 108 (90 Base) MCG/ACT inhaler     . amLODipine (NORVASC) 10 MG tablet Take 10 mg by mouth daily.    Marland Kitchen aspirin 81 MG EC tablet Take by mouth.    Marland Kitchen atorvastatin (LIPITOR) 80 MG  tablet Take 80 mg by mouth daily.    . busPIRone (BUSPAR) 15 MG tablet Take 15 mg by mouth 3 (three) times daily.     . clonazePAM (KLONOPIN) 1 MG tablet Take 1 mg by mouth 2 (two) times daily.    . diclofenac sodium (VOLTAREN) 1 % GEL Apply 2 g topically 4 (four) times daily.    . DULoxetine (CYMBALTA) 60 MG capsule Take 60 mg by mouth daily.     . ferrous sulfate (FERROUSUL) 325 (65 FE) MG tablet Take 325 mg by mouth daily with breakfast.    . gabapentin (NEURONTIN) 600 MG tablet Take by mouth.    Marland Kitchen glipiZIDE (GLUCOTROL) 5 MG tablet Take 2.5-5 mg by mouth 2 (two) times daily before a meal. Take one tablet (5mg ) by mouth at breakfast and one-half tablet (2.5mg ) at lunch    . lisinopril (ZESTRIL) 5 MG tablet Take by mouth.    . metFORMIN (GLUCOPHAGE) 500 MG tablet Take 500 mg by mouth 2 (two) times daily.    . pantoprazole (PROTONIX) 40 MG tablet Take 40 mg by mouth daily.     . polyethylene glycol powder (GLYCOLAX/MIRALAX) 17 GM/SCOOP powder Take by mouth.    . QUEtiapine (SEROQUEL) 25 MG tablet Take 25 mg by mouth 3 (three) times daily. Take one tablet (  25 mg) by mouth every morning, one tablet (25 mg) at noon and two tablets (50mg ) at bedtime    . traZODone (DESYREL) 50 MG tablet Take 50 mg by mouth at bedtime.    . ziprasidone (GEODON) 20 MG capsule Take 20 mg by mouth 2 (two) times daily.     No current facility-administered medications for this visit.    Allergies as of 02/16/2020 - Review Complete 02/16/2020  Allergen Reaction Noted  . Promethazine Other (See Comments) 12/11/2014  . Sumatriptan  02/12/2015  . Ciprofloxacin Other (See Comments) 12/11/2014  . Doxycycline Nausea And Vomiting 01/18/2015  . Imitrex [sumatriptan base] Other (See Comments) 07/01/2011  . Morphine and related Nausea And Vomiting 07/01/2011  . Penicillins Nausea And Vomiting and Other (See Comments) 01/18/2015  . Ranitidine hcl Other (See Comments) 12/11/2014  . Zantac [ranitidine hcl] Other (See Comments)  04/29/2015  . Ketorolac tromethamine Rash 07/01/2011    ROS:  General: Negative for anorexia, weight loss, fever, chills, fatigue, weakness. ENT: Negative for hoarseness, difficulty swallowing , nasal congestion. CV: Negative for chest pain, angina, palpitations, dyspnea on exertion, peripheral edema.  Respiratory: Negative for dyspnea at rest, dyspnea on exertion, cough, sputum, wheezing.  GI: See history of present illness. GU:  Negative for dysuria, hematuria, urinary incontinence, urinary frequency, nocturnal urination.  Endo: Negative for unusual weight change.    Physical Examination:   BP 128/67   Pulse 61   Ht 5\' 1"  (1.549 m)   Wt 163 lb (73.9 kg)   BMI 30.80 kg/m   General: Well-nourished, well-developed in no acute distress.  Eyes: No icterus. Conjunctivae pink. Mouth: Oropharyngeal mucosa moist and pink , no lesions erythema or exudate. Lungs: Clear to auscultation bilaterally. Non-labored. Heart: Regular rate and rhythm, no murmurs rubs or gallops.  Abdomen: Bowel sounds are normal, nontender, nondistended, no hepatosplenomegaly or masses, no abdominal bruits or hernia , no rebound or guarding.   Extremities: No lower extremity edema. No clubbing or deformities. Neuro: Alert and oriented x 3.  Grossly intact. Skin: Warm and dry, no jaundice.   Psych: Alert and cooperative, normal mood and affect.   Imaging Studies: No results found.  Assessment and Plan:   Tammy Armstrong is a 69 y.o. y/o female to see me as a hospital follow-up for rectal bleeding and anemia.  Since discharge no further episodes of anemia.  That time she was on blood thinners for a DVT.  Presently off of it.  Never had a colonoscopy.  Incidental finding of cirrhosis on CT angiogram.  Prior history of drug use and obesity.  Presently a BMI of 30.  Much higher in the past.   Plan 1.  She requires a colonoscopy to evaluate for rectal bleeding as well as an endoscopy to screen for esophageal  varices 2.  Due to incidental finding of liver cirrhosis on CT scan in April 2021 will require full viral hepatitis and autoimmune liver work-up.  She will require a right upper quadrant ultrasound every 6 months. 3.  Avoid NSAIDs.  4.  Commence on famotidine 40 mg a day long-term as she is on anticoagulation with aspirin  Risk of further bleeding.   I have discussed alternative options, risks & benefits,  which include, but are not limited to, bleeding, infection, perforation,respiratory complication & drug reaction.  The patient agrees with this plan & written consent will be obtained.     Dr Jonathon Bellows  MD,MRCP [facility]) Follow up in 8 weeks

## 2020-02-16 NOTE — Addendum Note (Signed)
Addended by: Dorethea Clan on: 02/16/2020 02:26 PM   Modules accepted: Orders

## 2020-02-18 ENCOUNTER — Telehealth: Payer: Self-pay | Admitting: Gastroenterology

## 2020-02-18 NOTE — Telephone Encounter (Signed)
LVM for patient To call our office to reschedule 05/09/20 appt due to Doctor's schedule.

## 2020-02-19 LAB — ANA: Anti Nuclear Antibody (ANA): NEGATIVE

## 2020-02-19 LAB — ALPHA-1-ANTITRYPSIN: A-1 Antitrypsin: 136 mg/dL (ref 101–187)

## 2020-02-19 LAB — IMMUNOGLOBULINS A/E/G/M, SERUM
IgA/Immunoglobulin A, Serum: 211 mg/dL (ref 87–352)
IgE (Immunoglobulin E), Serum: 93 IU/mL (ref 6–495)
IgG (Immunoglobin G), Serum: 871 mg/dL (ref 586–1602)
IgM (Immunoglobulin M), Srm: 33 mg/dL (ref 26–217)

## 2020-02-19 LAB — MITOCHONDRIAL/SMOOTH MUSCLE AB PNL
Mitochondrial Ab: <20 Units (ref 0.0–20.0)
Smooth Muscle Ab: 9 Units (ref 0–19)

## 2020-02-19 LAB — CK: Total CK: 68 U/L (ref 32–182)

## 2020-02-19 LAB — IRON,TIBC AND FERRITIN PANEL
Ferritin: 19 ng/mL (ref 15–150)
Iron Saturation: 7 % — CL (ref 15–55)
Iron: 28 ug/dL (ref 27–139)
Total Iron Binding Capacity: 391 ug/dL (ref 250–450)
UIBC: 363 ug/dL (ref 118–369)

## 2020-02-19 LAB — HEPATITIS B SURFACE ANTIBODY,QUALITATIVE: Hep B Surface Ab, Qual: NONREACTIVE

## 2020-02-19 LAB — HEPATITIS A ANTIBODY, TOTAL: hep A Total Ab: NEGATIVE

## 2020-02-19 LAB — HEPATITIS B E ANTIGEN: Hep B E Ag: NEGATIVE

## 2020-02-19 LAB — HEPATITIS B SURFACE ANTIGEN: Hepatitis B Surface Ag: NEGATIVE

## 2020-02-19 LAB — CELIAC DISEASE AB SCREEN W/RFX
Antigliadin Abs, IgA: 3 units (ref 0–19)
Transglutaminase IgA: <2 U/mL (ref 0–3)

## 2020-02-19 LAB — HEPATITIS B CORE ANTIBODY, TOTAL: Hep B Core Total Ab: NEGATIVE

## 2020-02-19 LAB — CERULOPLASMIN: Ceruloplasmin: 21.3 mg/dL (ref 19.0–39.0)

## 2020-02-19 LAB — HEPATITIS C ANTIBODY: Hep C Virus Ab: 5 s/co ratio — ABNORMAL HIGH (ref 0.0–0.9)

## 2020-02-19 LAB — ANTI-MICROSOMAL ANTIBODY LIVER / KIDNEY: LKM1 Ab: 0.6 Units (ref 0.0–20.0)

## 2020-02-19 LAB — HEPATITIS B E ANTIBODY: Hep B E Ab: NEGATIVE

## 2020-03-11 ENCOUNTER — Other Ambulatory Visit
Admission: RE | Admit: 2020-03-11 | Discharge: 2020-03-11 | Disposition: A | Discharge status: Home / Self Care | Payer: Medicare Other | Source: Ambulatory Visit | Attending: Gastroenterology | Admitting: Gastroenterology

## 2020-03-11 ENCOUNTER — Other Ambulatory Visit: Payer: Self-pay

## 2020-03-11 DIAGNOSIS — Z01812 Encounter for preprocedural laboratory examination: Secondary | ICD-10-CM | POA: Diagnosis present

## 2020-03-11 DIAGNOSIS — Z20822 Contact with and (suspected) exposure to covid-19: Secondary | ICD-10-CM | POA: Insufficient documentation

## 2020-03-11 LAB — SARS CORONAVIRUS 2 (TAT 6-24 HRS): SARS Coronavirus 2: NEGATIVE

## 2020-03-15 ENCOUNTER — Other Ambulatory Visit: Payer: Self-pay

## 2020-03-15 ENCOUNTER — Ambulatory Visit: Payer: Medicare Other | Admitting: Certified Registered"

## 2020-03-15 ENCOUNTER — Encounter
Admission: RE | Disposition: A | Discharge status: Home / Self Care | Payer: Self-pay | Source: Home / Self Care | Attending: Gastroenterology

## 2020-03-15 ENCOUNTER — Encounter: Payer: Self-pay | Admitting: Gastroenterology

## 2020-03-15 ENCOUNTER — Ambulatory Visit
Admission: RE | Admit: 2020-03-15 | Discharge: 2020-03-15 | Disposition: A | Discharge status: Home / Self Care | Payer: Medicare Other | Attending: Gastroenterology | Admitting: Gastroenterology

## 2020-03-15 DIAGNOSIS — K625 Hemorrhage of anus and rectum: Secondary | ICD-10-CM | POA: Diagnosis present

## 2020-03-15 DIAGNOSIS — E1151 Type 2 diabetes mellitus with diabetic peripheral angiopathy without gangrene: Secondary | ICD-10-CM | POA: Insufficient documentation

## 2020-03-15 DIAGNOSIS — Z8249 Family history of ischemic heart disease and other diseases of the circulatory system: Secondary | ICD-10-CM | POA: Insufficient documentation

## 2020-03-15 DIAGNOSIS — F17210 Nicotine dependence, cigarettes, uncomplicated: Secondary | ICD-10-CM | POA: Insufficient documentation

## 2020-03-15 DIAGNOSIS — Z881 Allergy status to other antibiotic agents status: Secondary | ICD-10-CM | POA: Diagnosis not present

## 2020-03-15 DIAGNOSIS — K7460 Unspecified cirrhosis of liver: Secondary | ICD-10-CM | POA: Diagnosis not present

## 2020-03-15 DIAGNOSIS — Z880 Allergy status to penicillin: Secondary | ICD-10-CM | POA: Insufficient documentation

## 2020-03-15 DIAGNOSIS — K219 Gastro-esophageal reflux disease without esophagitis: Secondary | ICD-10-CM | POA: Diagnosis not present

## 2020-03-15 DIAGNOSIS — J449 Chronic obstructive pulmonary disease, unspecified: Secondary | ICD-10-CM | POA: Diagnosis not present

## 2020-03-15 DIAGNOSIS — I10 Essential (primary) hypertension: Secondary | ICD-10-CM | POA: Diagnosis not present

## 2020-03-15 DIAGNOSIS — Z7982 Long term (current) use of aspirin: Secondary | ICD-10-CM | POA: Diagnosis not present

## 2020-03-15 DIAGNOSIS — Z7984 Long term (current) use of oral hypoglycemic drugs: Secondary | ICD-10-CM | POA: Insufficient documentation

## 2020-03-15 DIAGNOSIS — Z818 Family history of other mental and behavioral disorders: Secondary | ICD-10-CM | POA: Insufficient documentation

## 2020-03-15 DIAGNOSIS — Z90710 Acquired absence of both cervix and uterus: Secondary | ICD-10-CM | POA: Diagnosis not present

## 2020-03-15 DIAGNOSIS — F319 Bipolar disorder, unspecified: Secondary | ICD-10-CM | POA: Insufficient documentation

## 2020-03-15 DIAGNOSIS — F0390 Unspecified dementia without behavioral disturbance: Secondary | ICD-10-CM | POA: Diagnosis not present

## 2020-03-15 DIAGNOSIS — M1990 Unspecified osteoarthritis, unspecified site: Secondary | ICD-10-CM | POA: Insufficient documentation

## 2020-03-15 DIAGNOSIS — I69398 Other sequelae of cerebral infarction: Secondary | ICD-10-CM | POA: Diagnosis not present

## 2020-03-15 DIAGNOSIS — R0600 Dyspnea, unspecified: Secondary | ICD-10-CM | POA: Insufficient documentation

## 2020-03-15 DIAGNOSIS — Z885 Allergy status to narcotic agent status: Secondary | ICD-10-CM | POA: Insufficient documentation

## 2020-03-15 DIAGNOSIS — Z8619 Personal history of other infectious and parasitic diseases: Secondary | ICD-10-CM | POA: Diagnosis not present

## 2020-03-15 DIAGNOSIS — E7800 Pure hypercholesterolemia, unspecified: Secondary | ICD-10-CM | POA: Insufficient documentation

## 2020-03-15 DIAGNOSIS — F419 Anxiety disorder, unspecified: Secondary | ICD-10-CM | POA: Insufficient documentation

## 2020-03-15 DIAGNOSIS — Z9049 Acquired absence of other specified parts of digestive tract: Secondary | ICD-10-CM | POA: Insufficient documentation

## 2020-03-15 DIAGNOSIS — R0602 Shortness of breath: Secondary | ICD-10-CM | POA: Insufficient documentation

## 2020-03-15 DIAGNOSIS — G43909 Migraine, unspecified, not intractable, without status migrainosus: Secondary | ICD-10-CM | POA: Diagnosis not present

## 2020-03-15 DIAGNOSIS — Z888 Allergy status to other drugs, medicaments and biological substances status: Secondary | ICD-10-CM | POA: Insufficient documentation

## 2020-03-15 DIAGNOSIS — Z811 Family history of alcohol abuse and dependence: Secondary | ICD-10-CM | POA: Insufficient documentation

## 2020-03-15 DIAGNOSIS — Z823 Family history of stroke: Secondary | ICD-10-CM | POA: Insufficient documentation

## 2020-03-15 DIAGNOSIS — Z833 Family history of diabetes mellitus: Secondary | ICD-10-CM | POA: Insufficient documentation

## 2020-03-15 DIAGNOSIS — R569 Unspecified convulsions: Secondary | ICD-10-CM | POA: Diagnosis not present

## 2020-03-15 HISTORY — PX: ESOPHAGOGASTRODUODENOSCOPY (EGD) WITH PROPOFOL: SHX5813

## 2020-03-15 HISTORY — PX: COLONOSCOPY WITH PROPOFOL: SHX5780

## 2020-03-15 LAB — GLUCOSE, CAPILLARY: Glucose-Capillary: 231 mg/dL — ABNORMAL HIGH (ref 70–99)

## 2020-03-15 SURGERY — COLONOSCOPY WITH PROPOFOL
Anesthesia: General

## 2020-03-15 MED ORDER — PROPOFOL 10 MG/ML IV BOLUS
INTRAVENOUS | Status: DC | PRN
  Administered 2020-03-15: 50 mg via INTRAVENOUS

## 2020-03-15 MED ORDER — PROPOFOL 500 MG/50ML IV EMUL
INTRAVENOUS | Status: DC | PRN
  Administered 2020-03-15: 120 ug/kg/min via INTRAVENOUS

## 2020-03-15 MED ORDER — LIDOCAINE HCL (PF) 2 % IJ SOLN
INTRAMUSCULAR | Status: AC
  Filled 2020-03-15: qty 5

## 2020-03-15 MED ORDER — LIDOCAINE HCL (CARDIAC) PF 100 MG/5ML IV SOSY
PREFILLED_SYRINGE | INTRAVENOUS | Status: DC | PRN
  Administered 2020-03-15: 50 mg via INTRAVENOUS

## 2020-03-15 MED ORDER — GLYCOPYRROLATE 0.2 MG/ML IJ SOLN
INTRAMUSCULAR | Status: AC
  Filled 2020-03-15: qty 1

## 2020-03-15 MED ORDER — PROPOFOL 500 MG/50ML IV EMUL
INTRAVENOUS | Status: AC
  Filled 2020-03-15: qty 50

## 2020-03-15 MED ORDER — GLYCOPYRROLATE 0.2 MG/ML IJ SOLN
INTRAMUSCULAR | Status: DC | PRN
  Administered 2020-03-15: .2 mg via INTRAVENOUS

## 2020-03-15 MED ORDER — SODIUM CHLORIDE 0.9 % IV SOLN: INTRAVENOUS | Status: DC

## 2020-03-15 NOTE — Anesthesia Postprocedure Evaluation (Signed)
Anesthesia Post Note  Patient: Tammy Armstrong  Procedure(s) Performed: COLONOSCOPY WITH PROPOFOL (N/A ) ESOPHAGOGASTRODUODENOSCOPY (EGD) WITH PROPOFOL (N/A )  Patient location during evaluation: Endoscopy Anesthesia Type: General Level of consciousness: awake and alert Pain management: pain level controlled Vital Signs Assessment: post-procedure vital signs reviewed and stable Respiratory status: spontaneous breathing, nonlabored ventilation, respiratory function stable and patient connected to nasal cannula oxygen Cardiovascular status: blood pressure returned to baseline and stable Postop Assessment: no apparent nausea or vomiting Anesthetic complications: no   No complications documented.   Last Vitals:  Vitals:   03/15/20 0930 03/15/20 0940  BP: 131/85 (!) 130/48  Pulse: 84 80  Resp: (!) 26 19  Temp:    SpO2: 98% 99%    Last Pain:  Vitals:   03/15/20 0910  TempSrc: Temporal  PainSc:                  Martha Clan

## 2020-03-15 NOTE — Transfer of Care (Signed)
Immediate Anesthesia Transfer of Care Note  Patient: Tammy Armstrong  Procedure(s) Performed: COLONOSCOPY WITH PROPOFOL (N/A ) ESOPHAGOGASTRODUODENOSCOPY (EGD) WITH PROPOFOL (N/A )  Patient Location: PACU and Endoscopy Unit  Anesthesia Type:General  Level of Consciousness: drowsy  Airway & Oxygen Therapy: Patient Spontanous Breathing  Post-op Assessment: Report given to RN  Post vital signs: stable  Last Vitals:  Vitals Value Taken Time  BP    Temp    Pulse    Resp    SpO2      Last Pain:  Vitals:   03/15/20 0817  TempSrc: Temporal  PainSc: 0-No pain         Complications: No complications documented.

## 2020-03-15 NOTE — Anesthesia Preprocedure Evaluation (Addendum)
Anesthesia Evaluation  Patient identified by MRN, date of birth, ID band Patient awake    Reviewed: Allergy & Precautions, NPO status , Patient's Chart, lab work & pertinent test results  History of Anesthesia Complications Negative for: history of anesthetic complications  Airway Mallampati: II  TM Distance: >3 FB Neck ROM: Limited    Dental  (+) Edentulous Upper, Edentulous Lower, Dental Advidsory Given   Pulmonary shortness of breath and with exertion, neg sleep apnea, COPD,  COPD inhaler, neg recent URI, Current Smoker and Patient abstained from smoking.,    Pulmonary exam normal breath sounds clear to auscultation       Cardiovascular Exercise Tolerance: Poor hypertension, Pt. on medications and Pt. 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:  Respiratory failure ([place]) 07/02/2011: Seizure ([place]) No date: Shortness of breath dyspnea No date: Stroke [facility])   Reproductive/Obstetrics                            Anesthesia Physical  Anesthesia Plan  ASA: IV  Anesthesia Plan: General   Post-op Pain Management:    Induction: Intravenous  PONV Risk Score and Plan: 2 and Propofol infusion and TIVA  Airway Management Planned: Natural Airway and Nasal Cannula  Additional Equipment:   Intra-op Plan:   Post-operative Plan:   Informed Consent: I have reviewed the patients History and Physical, chart, labs and discussed the procedure including the risks, benefits and alternatives for the proposed anesthesia with the patient or authorized representative who has indicated his/her understanding and acceptance.       Plan Discussed with: CRNA  Anesthesia Plan Comments:        Anesthesia Quick Evaluation

## 2020-03-15 NOTE — Op Note (Signed)
[facility] Patient Name: Tammy Armstrong Procedure Date: 03/15/2020 8:57 AM MRN: [id number] Account #: [id number] Date of Birth: 04-Apr-1951 Admit Type: Outpatient Age: 69 Room: [facility] ENDO ROOM 1 Gender: Female Note Status: Finalized Procedure:             Colonoscopy Indications:           Rectal bleeding Providers:             Jonathon Bellows MD, MD Referring MD:          Gayland Curry MD, MD (Referring MD) Medicines:             Monitored Anesthesia Care Complications:         No immediate complications. Procedure:             Pre-Anesthesia Assessment:                        - Prior to the procedure, a History and Physical was                         performed, and patient medications, allergies and                         sensitivities were reviewed. The patient's tolerance                         of previous anesthesia was reviewed.                        - The risks and benefits of the procedure and the                         sedation options and risks were discussed with the                         patient. All questions were answered and informed                         consent was obtained.                        - ASA Grade Assessment: III - A patient with severe                         systemic disease.                        After obtaining informed consent, the colonoscope was                         passed under direct vision. Throughout the procedure,                         the patient's blood pressure, pulse, and oxygen                         saturations were monitored continuously. The                         Colonoscope was introduced through the anus with the  intention of advancing to the cecum. The scope was                         advanced to the sigmoid colon before the procedure was                         aborted. Medications were given. The colonoscopy was                         performed with ease.  The patient tolerated the                         procedure well. The quality of the bowel preparation                         was poor. Findings:      The perianal and digital rectal examinations were normal.      A large amount of solid stool was found in the rectum and in the sigmoid       colon, interfering with visualization. Impression:            - Preparation of the colon was poor.                        - Stool in the rectum and in the sigmoid colon.                        - No specimens collected. Recommendation:        - Discharge patient to home (with escort).                        - Resume previous diet.                        - Continue present medications.                        - Repeat colonoscopy in 2 weeks because the bowel                         preparation was suboptimal. Procedure Code(s):     --- Professional ---                        [phone], 53, Colonoscopy, flexible; diagnostic,                         including collection of specimen(s) by brushing or                         washing, when performed (separate procedure) Diagnosis Code(s):     --- Professional ---                        K62.5, Hemorrhage of anus and rectum CPT copyright 2019 [employer]. All rights reserved. The codes documented in this report are preliminary and upon coder review may  be revised to meet current compliance requirements. Jonathon Bellows, MD Jonathon Bellows MD, MD 03/15/2020 9:09:23 AM This report has been signed electronically. Number of Addenda: 0 Note  Initiated On: 03/15/2020 8:57 AM Total Procedure Duration: 0 hours 0 minutes 26 seconds  Estimated Blood Loss:  Estimated blood loss: none.      [facility]

## 2020-03-15 NOTE — Op Note (Signed)
[facility] Patient Name: Tammy Armstrong Procedure Date: 03/15/2020 8:57 AM MRN: [id number] Account #: [id number] Date of Birth: 10/12/1950 Admit Type: Outpatient Age: 69 Room: [facility] ENDO ROOM 1 Gender: Female Note Status: Finalized Procedure:             Upper GI endoscopy Indications:           Cirrhosis rule out esophageal varices Providers:             Jonathon Bellows MD, MD Referring MD:          Gayland Curry MD, MD (Referring MD) Medicines:             Monitored Anesthesia Care Complications:         No immediate complications. Procedure:             Pre-Anesthesia Assessment:                        - Prior to the procedure, a History and Physical was                         performed, and patient medications, allergies and                         sensitivities were reviewed. The patient's tolerance                         of previous anesthesia was reviewed.                        - The risks and benefits of the procedure and the                         sedation options and risks were discussed with the                         patient. All questions were answered and informed                         consent was obtained.                        - ASA Grade Assessment: III - A patient with severe                         systemic disease.                        After obtaining informed consent, the endoscope was                         passed under direct vision. Throughout the procedure,                         the patient's blood pressure, pulse, and oxygen                         saturations were monitored continuously. The Endoscope                         was introduced through  the mouth, and advanced to the                         third part of duodenum. The upper GI endoscopy was                         accomplished with ease. The patient tolerated the                         procedure well. Findings:      The esophagus was normal.       The stomach was normal.      The examined duodenum was normal. Impression:            - Normal esophagus.                        - Normal stomach.                        - Normal examined duodenum.                        - No specimens collected. Recommendation:        - Perform a colonoscopy today. Procedure Code(s):     --- Professional ---                        [phone], Esophagogastroduodenoscopy, flexible,                         transoral; diagnostic, including collection of                         specimen(s) by brushing or washing, when performed                         (separate procedure) Diagnosis Code(s):     --- Professional ---                        K74.60, Unspecified cirrhosis of liver CPT copyright 2019 American Medical Association. All rights reserved. The codes documented in this report are preliminary and upon coder review may  be revised to meet current compliance requirements. Jonathon Bellows, MD Jonathon Bellows MD, MD 03/15/2020 9:05:21 AM This report has been signed electronically. Number of Addenda: 0 Note Initiated On: 03/15/2020 8:57 AM Estimated Blood Loss:  Estimated blood loss: none.      [facility]

## 2020-03-15 NOTE — H&P (Signed)
Jonathon Bellows, MD [address] [place], [place], [place], [address] [address], [place], [place], [place], [address] Phone: [phone]  Fax: [phone]  Primary Care Physician:  Gayland Curry, MD   Pre-Procedure History & Physical: HPI:  Tammy Armstrong is a 69 y.o. female is here for an endoscopy and colonoscopy    Past Medical History:  Diagnosis Date  . Anxiety   . Arthritis   . Carotid artery occlusion   . Cigarette smoker one half pack a day or less   . COPD (chronic obstructive pulmonary disease) ([place]) 07/04/2011  . Depression   . Diabetes mellitus   . Diabetes mellitus 07/04/2011  . GERD (gastroesophageal reflux disease)   . Hepatitis C   . Hypercholesterolemia   . Hypertension   . Migraines   . Pancreatitis   . 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 Respiratory failure ([place]) 07/04/2011  . Seizure ([place]) 07/02/2011  . Shortness of breath dyspnea   . Stroke [facility])     Past Surgical History:  Procedure Laterality Date  . ABDOMINAL HYSTERECTOMY    . APPENDECTOMY    . BACK SURGERY  2012   cyst removed from spine , [place]  . CHOLECYSTECTOMY    . ENDARTERECTOMY Left 05/18/2015   Procedure: ENDARTERECTOMY CAROTID;  Surgeon: Algernon Huxley, MD;  Location: [facility] ORS;  Service: Vascular;  Laterality: Left;    Prior to Admission medications   Medication Sig Start Date End Date Taking? Authorizing Provider  amLODipine (NORVASC) 10 MG tablet Take 10 mg by mouth daily. 09/17/19  Yes [provider]  aspirin 81 MG EC tablet Take by mouth. 02/08/20 02/07/21 Yes [provider]  DULoxetine (CYMBALTA) 60 MG capsule Take 60 mg by mouth daily.    Yes [provider]  gabapentin (NEURONTIN) 600 MG tablet Take by mouth. 02/08/20  Yes [provider]  glipiZIDE (GLUCOTROL) 5 MG tablet Take 2.5-5 mg by mouth 2 (two) times daily before a meal. Take one tablet (5mg ) by mouth at breakfast and one-half tablet (2.5mg ) at lunch   Yes  [provider]  lisinopril (ZESTRIL) 5 MG tablet Take by mouth. 12/26/19 12/25/20 Yes [provider]  metFORMIN (GLUCOPHAGE) 500 MG tablet Take 500 mg by mouth 2 (two) times daily. 09/17/19  Yes [provider]  QUEtiapine (SEROQUEL) 25 MG tablet Take 25 mg by mouth 3 (three) times daily. Take one tablet (25 mg) by mouth every morning, one tablet (25 mg) at noon and two tablets (50mg ) at bedtime   Yes [provider]  traZODone (DESYREL) 50 MG tablet Take 50 mg by mouth at bedtime. 04/18/19  Yes [provider]  acetaminophen (TYLENOL) 500 MG tablet Take 1,000 mg by mouth every 8 (eight) hours as needed for pain. 04/25/18   [provider]  albuterol (VENTOLIN HFA) 108 (90 Base) MCG/ACT inhaler  12/01/19   [provider]  atorvastatin (LIPITOR) 80 MG tablet Take 80 mg by mouth daily. Patient not taking: Reported on 03/15/2020    [provider]  busPIRone (BUSPAR) 15 MG tablet Take 15 mg by mouth 3 (three) times daily.  Patient not taking: Reported on 03/15/2020 07/23/16   [provider]  clonazePAM (KLONOPIN) 1 MG tablet Take 1 mg by mouth 2 (two) times daily. Patient not taking: Reported on 03/15/2020    [provider]  diclofenac sodium (VOLTAREN) 1 % GEL Apply 2 g topically 4 (four) times daily. 01/10/17   [provider]  ferrous sulfate (FERROUSUL) 325 (65 FE) MG tablet Take 325 mg by mouth daily with breakfast. 05/11/19 05/10/20  [provider]  omeprazole (PRILOSEC) 20 MG capsule Take 1 capsule (20 mg total) by mouth daily. Patient not taking: Reported on 03/15/2020 02/16/20   Jonathon Bellows, MD  pantoprazole (PROTONIX) 40 MG tablet Take 40 mg by mouth daily.  Patient not taking: Reported on 03/15/2020    [provider]  polyethylene glycol powder (GLYCOLAX/MIRALAX) 17 GM/SCOOP powder Take by mouth. 02/08/20   [provider]  ziprasidone (GEODON) 20 MG capsule Take 20 mg by  mouth 2 (two) times daily. Patient not taking: Reported on 03/15/2020 08/23/18   [provider]    Allergies as of 02/17/2020 - Review Complete 02/16/2020  Allergen Reaction Noted  . Promethazine Other (See Comments) 12/11/2014  . Sumatriptan  02/12/2015  . Ciprofloxacin Other (See Comments) 12/11/2014  . Doxycycline Nausea And Vomiting 01/18/2015  . Imitrex [sumatriptan base] Other (See Comments) 07/01/2011  . Morphine and related Nausea And Vomiting 07/01/2011  . Penicillins Nausea And Vomiting and Other (See Comments) 01/18/2015  . Ranitidine hcl Other (See Comments) 12/11/2014  . Zantac [ranitidine hcl] Other (See Comments) 04/29/2015  . Ketorolac tromethamine Rash 07/01/2011    Family History  Problem Relation Age of Onset  . Hypertension Mother   . Heart attack Mother   . Diabetes Mother   . Anxiety disorder Mother   . Stroke Mother   . Diabetes Father   . Diabetes Brother   . Alcohol abuse Other        siblings  . Hypertension Brother   . Breast cancer Neg Hx     Social History   Socioeconomic History  . Marital status: Single    Spouse name: Not on file  . Number of children: 3  . Years of education: Not on file  . Highest education level: Not on file  Occupational History  . Not on file  Tobacco Use  . Smoking status: Current Every Day Smoker    Packs/day: 0.50    Years: 52.00    Pack years: 26.00    Types: Cigarettes  . Smokeless tobacco: Never Used  Vaping Use  . Vaping Use: Never used  Substance and Sexual Activity  . Alcohol use: Yes    Comment: glass of wine every once in a while   . Drug use: No  . Sexual activity: Not Currently  Other Topics Concern  . Not on file  Social History Narrative  . Not on file   Social Determinants of Health   Financial Resource Strain:   . Difficulty of Paying Living Expenses: Not on file  Food Insecurity:   . Worried About Charity fundraiser in the Last Year: Not on file  . Ran Out of Food in the  Last Year: Not on file  Transportation Needs:   . Lack of Transportation (Medical): Not on file  . Lack of Transportation (Non-Medical): Not on file  Physical Activity:   . Days of Exercise per Week: Not on file  . Minutes of Exercise per Session: Not on file  Stress:   . Feeling of Stress : Not on file  Social Connections:   . Frequency of Communication with Friends and Family: Not on file  . Frequency of Social Gatherings with Friends and Family: Not on file  . Attends Religious Services: Not on file  . Active Member of Clubs or Organizations: Not on file  .  Attends Archivist Meetings: Not on file  . Marital Status: Not on file  Intimate Partner Violence:   . Fear of Current or Ex-Partner: Not on file  . Emotionally Abused: Not on file  . Physically Abused: Not on file  . Sexually Abused: Not on file    Review of Systems: See HPI, otherwise negative ROS  Physical Exam: BP (!) 143/67   Pulse 83   Temp (!) 97 F (36.1 C) (Temporal)   Resp 20   Ht 5\' 1"  (1.549 m)   Wt 72.6 kg   SpO2 100%   BMI 30.23 kg/m  General:   Alert,  pleasant and cooperative in NAD Head:  Normocephalic and atraumatic. Neck:  Supple; no masses or thyromegaly. Lungs:  Clear throughout to auscultation, normal respiratory effort.    Heart:  +S1, +S2, Regular rate and rhythm, No edema. Abdomen:  Soft, nontender and nondistended. Normal bowel sounds, without guarding, and without rebound.   Neurologic:  Alert and  oriented x4;  grossly normal neurologically.  Impression/Plan: Tammy Armstrong is here for an endoscopy and colonoscopy  to be performed for  evaluation of rectal bleeding and screening for esophageal varices    Risks, benefits, limitations, and alternatives regarding endoscopy have been reviewed with the patient.  Questions have been answered.  All parties agreeable.   Jonathon Bellows, MD  03/15/2020, 8:54 AM

## 2020-03-16 ENCOUNTER — Encounter: Payer: Self-pay | Admitting: Gastroenterology

## 2020-03-17 ENCOUNTER — Telehealth: Payer: Self-pay | Admitting: Gastroenterology

## 2020-03-17 NOTE — Telephone Encounter (Signed)
Patient states Dr. Vicente Males wanted her to have repeat procedure in 2 weeks. Patient requesting cb to sch procedure and get new prep sent in. Pt states she left a vm yesterday with no call back.

## 2020-03-23 ENCOUNTER — Other Ambulatory Visit: Payer: Self-pay

## 2020-03-23 DIAGNOSIS — K625 Hemorrhage of anus and rectum: Secondary | ICD-10-CM

## 2020-03-23 MED ORDER — PEG 3350-KCL-NABCB-NACL-NASULF 236 G PO SOLR: ORAL | 0 refills | Status: DC

## 2020-03-24 ENCOUNTER — Encounter: Payer: Self-pay | Admitting: Gastroenterology

## 2020-03-25 ENCOUNTER — Telehealth: Payer: Self-pay

## 2020-03-25 DIAGNOSIS — K7460 Unspecified cirrhosis of liver: Secondary | ICD-10-CM

## 2020-03-25 NOTE — Telephone Encounter (Signed)
-----   Message from Jonathon Bellows, MD sent at 03/24/2020 10:52 AM EDT ----- Inform Hepatitis C antibody is positive : check viral load and genotype. Follow up after   Dr Jonathon Bellows MD,MRCP [facility]) Gastroenterology/Hepatology Pager: [phone]

## 2020-03-25 NOTE — Telephone Encounter (Signed)
Called pt to inform of results and Dr. Anna's recommendations.  Unable to contact, LVM to return call 

## 2020-03-29 ENCOUNTER — Telehealth: Payer: Self-pay

## 2020-03-29 MED ORDER — NA SULFATE-K SULFATE-MG SULF 17.5-3.13-1.6 GM/177ML PO SOLN: 354.0000 mL | Freq: Once | ORAL | 0 refills | Status: AC

## 2020-03-29 NOTE — Telephone Encounter (Signed)
Patient states she will not take the [place] that she wants Suprep called in for her. Sent medication for Suprep to the pharmacy.

## 2020-03-29 NOTE — Telephone Encounter (Signed)
Patient verbalized understanding. What labs do we order

## 2020-03-30 NOTE — Addendum Note (Signed)
Addended by: Dorethea Clan on: 03/30/2020 11:25 AM   Modules accepted: Orders

## 2020-03-30 NOTE — Telephone Encounter (Signed)
Order labs.

## 2020-04-07 ENCOUNTER — Other Ambulatory Visit
Admission: RE | Admit: 2020-04-07 | Discharge: 2020-04-07 | Disposition: A | Discharge status: Home / Self Care | Payer: Medicare Other | Source: Ambulatory Visit | Attending: Gastroenterology | Admitting: Gastroenterology

## 2020-04-07 ENCOUNTER — Other Ambulatory Visit: Payer: Self-pay

## 2020-04-07 DIAGNOSIS — Z01812 Encounter for preprocedural laboratory examination: Secondary | ICD-10-CM | POA: Diagnosis present

## 2020-04-07 DIAGNOSIS — Z20822 Contact with and (suspected) exposure to covid-19: Secondary | ICD-10-CM | POA: Diagnosis not present

## 2020-04-08 LAB — SARS CORONAVIRUS 2 (TAT 6-24 HRS): SARS Coronavirus 2: NEGATIVE

## 2020-04-09 LAB — HCV RNA QUANT: Hepatitis C Quantitation: NOT DETECTED IU/mL

## 2020-04-09 LAB — HEPATITIS C GENOTYPE

## 2020-04-11 ENCOUNTER — Encounter
Admission: RE | Disposition: A | Discharge status: Home / Self Care | Payer: Self-pay | Source: Home / Self Care | Attending: Gastroenterology

## 2020-04-11 ENCOUNTER — Ambulatory Visit: Payer: Medicare Other | Admitting: Anesthesiology

## 2020-04-11 ENCOUNTER — Encounter: Payer: Self-pay | Admitting: Gastroenterology

## 2020-04-11 ENCOUNTER — Other Ambulatory Visit: Payer: Self-pay

## 2020-04-11 ENCOUNTER — Telehealth: Payer: Self-pay

## 2020-04-11 ENCOUNTER — Ambulatory Visit
Admission: RE | Admit: 2020-04-11 | Discharge: 2020-04-11 | Disposition: A | Discharge status: Home / Self Care | Payer: Medicare Other | Attending: Gastroenterology | Admitting: Gastroenterology

## 2020-04-11 DIAGNOSIS — F419 Anxiety disorder, unspecified: Secondary | ICD-10-CM | POA: Diagnosis not present

## 2020-04-11 DIAGNOSIS — Z90710 Acquired absence of both cervix and uterus: Secondary | ICD-10-CM | POA: Diagnosis not present

## 2020-04-11 DIAGNOSIS — I10 Essential (primary) hypertension: Secondary | ICD-10-CM | POA: Insufficient documentation

## 2020-04-11 DIAGNOSIS — R0602 Shortness of breath: Secondary | ICD-10-CM | POA: Diagnosis not present

## 2020-04-11 DIAGNOSIS — R569 Unspecified convulsions: Secondary | ICD-10-CM | POA: Insufficient documentation

## 2020-04-11 DIAGNOSIS — R519 Headache, unspecified: Secondary | ICD-10-CM | POA: Diagnosis not present

## 2020-04-11 DIAGNOSIS — Z7982 Long term (current) use of aspirin: Secondary | ICD-10-CM | POA: Insufficient documentation

## 2020-04-11 DIAGNOSIS — F0390 Unspecified dementia without behavioral disturbance: Secondary | ICD-10-CM | POA: Insufficient documentation

## 2020-04-11 DIAGNOSIS — Z79899 Other long term (current) drug therapy: Secondary | ICD-10-CM | POA: Diagnosis not present

## 2020-04-11 DIAGNOSIS — Z8619 Personal history of other infectious and parasitic diseases: Secondary | ICD-10-CM | POA: Insufficient documentation

## 2020-04-11 DIAGNOSIS — Z811 Family history of alcohol abuse and dependence: Secondary | ICD-10-CM | POA: Insufficient documentation

## 2020-04-11 DIAGNOSIS — Z7984 Long term (current) use of oral hypoglycemic drugs: Secondary | ICD-10-CM | POA: Diagnosis not present

## 2020-04-11 DIAGNOSIS — Z818 Family history of other mental and behavioral disorders: Secondary | ICD-10-CM | POA: Insufficient documentation

## 2020-04-11 DIAGNOSIS — E1151 Type 2 diabetes mellitus with diabetic peripheral angiopathy without gangrene: Secondary | ICD-10-CM | POA: Insufficient documentation

## 2020-04-11 DIAGNOSIS — Z8673 Personal history of transient ischemic attack (TIA), and cerebral infarction without residual deficits: Secondary | ICD-10-CM | POA: Diagnosis not present

## 2020-04-11 DIAGNOSIS — J449 Chronic obstructive pulmonary disease, unspecified: Secondary | ICD-10-CM | POA: Diagnosis not present

## 2020-04-11 DIAGNOSIS — Z881 Allergy status to other antibiotic agents status: Secondary | ICD-10-CM | POA: Insufficient documentation

## 2020-04-11 DIAGNOSIS — Z823 Family history of stroke: Secondary | ICD-10-CM | POA: Insufficient documentation

## 2020-04-11 DIAGNOSIS — Z9049 Acquired absence of other specified parts of digestive tract: Secondary | ICD-10-CM | POA: Diagnosis not present

## 2020-04-11 DIAGNOSIS — Z832 Family history of diseases of the blood and blood-forming organs and certain disorders involving the immune mechanism: Secondary | ICD-10-CM | POA: Insufficient documentation

## 2020-04-11 DIAGNOSIS — Z885 Allergy status to narcotic agent status: Secondary | ICD-10-CM | POA: Diagnosis not present

## 2020-04-11 DIAGNOSIS — Z7951 Long term (current) use of inhaled steroids: Secondary | ICD-10-CM | POA: Diagnosis not present

## 2020-04-11 DIAGNOSIS — F319 Bipolar disorder, unspecified: Secondary | ICD-10-CM | POA: Insufficient documentation

## 2020-04-11 DIAGNOSIS — Z880 Allergy status to penicillin: Secondary | ICD-10-CM | POA: Diagnosis not present

## 2020-04-11 DIAGNOSIS — K219 Gastro-esophageal reflux disease without esophagitis: Secondary | ICD-10-CM | POA: Diagnosis not present

## 2020-04-11 DIAGNOSIS — E7800 Pure hypercholesterolemia, unspecified: Secondary | ICD-10-CM | POA: Insufficient documentation

## 2020-04-11 DIAGNOSIS — I739 Peripheral vascular disease, unspecified: Secondary | ICD-10-CM | POA: Insufficient documentation

## 2020-04-11 DIAGNOSIS — K625 Hemorrhage of anus and rectum: Secondary | ICD-10-CM

## 2020-04-11 DIAGNOSIS — Z833 Family history of diabetes mellitus: Secondary | ICD-10-CM | POA: Insufficient documentation

## 2020-04-11 DIAGNOSIS — Z888 Allergy status to other drugs, medicaments and biological substances status: Secondary | ICD-10-CM | POA: Insufficient documentation

## 2020-04-11 DIAGNOSIS — F17210 Nicotine dependence, cigarettes, uncomplicated: Secondary | ICD-10-CM | POA: Insufficient documentation

## 2020-04-11 HISTORY — PX: COLONOSCOPY WITH PROPOFOL: SHX5780

## 2020-04-11 LAB — GLUCOSE, CAPILLARY: Glucose-Capillary: 205 mg/dL — ABNORMAL HIGH (ref 70–99)

## 2020-04-11 SURGERY — COLONOSCOPY WITH PROPOFOL
Anesthesia: General

## 2020-04-11 MED ORDER — SODIUM CHLORIDE 0.9 % IV SOLN: INTRAVENOUS | Status: DC

## 2020-04-11 MED ORDER — PROPOFOL 10 MG/ML IV BOLUS
INTRAVENOUS | Status: DC | PRN
  Administered 2020-04-11: 80 mg via INTRAVENOUS

## 2020-04-11 MED ORDER — PROPOFOL 500 MG/50ML IV EMUL
INTRAVENOUS | Status: DC | PRN
  Administered 2020-04-11: 140 ug/kg/min via INTRAVENOUS

## 2020-04-11 NOTE — Telephone Encounter (Signed)
Pt has been notified of results and Dr. Anna's recommendations. 

## 2020-04-11 NOTE — Telephone Encounter (Signed)
-----   Message from Jonathon Bellows, MD sent at 04/11/2020 12:08 PM EDT ----- Inform she does not have hepatitis C.

## 2020-04-11 NOTE — H&P (Signed)
Jonathon Bellows, MD [address] [place], [place], [place], [address] [address], [place], [place], [place], [address] Phone: [phone]  Fax: [phone]  Primary Care Physician:  Gayland Curry, MD   Pre-Procedure History & Physical: HPI:  Tammy Armstrong is a 69 y.o. female is here for an colonoscopy.   Past Medical History:  Diagnosis Date  . Anxiety   . Arthritis   . Carotid artery occlusion   . Cigarette smoker one half pack a day or less   . COPD (chronic obstructive pulmonary disease) ([place]) 07/04/2011  . Depression   . Diabetes mellitus   . Diabetes mellitus 07/04/2011  . GERD (gastroesophageal reflux disease)   . Hepatitis C   . Hypercholesterolemia   . Hypertension   . Migraines   . Pancreatitis   . 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 Respiratory failure ([place]) 07/04/2011  . Seizure ([place]) 07/02/2011  . Shortness of breath dyspnea   . Stroke [facility])     Past Surgical History:  Procedure Laterality Date  . ABDOMINAL HYSTERECTOMY    . APPENDECTOMY    . BACK SURGERY  2012   cyst removed from spine , [place]  . CHOLECYSTECTOMY    . COLONOSCOPY WITH PROPOFOL N/A 03/15/2020   Procedure: COLONOSCOPY WITH PROPOFOL;  Surgeon: Jonathon Bellows, MD;  Location[facility] ENDOSCOPY;  Service: Gastroenterology;  Laterality: N/A;  . ENDARTERECTOMY Left 05/18/2015   Procedure: ENDARTERECTOMY CAROTID;  Surgeon: Algernon Huxley, MD;  Location: [facility] ORS;  Service: Vascular;  Laterality: Left;  . ESOPHAGOGASTRODUODENOSCOPY (EGD) WITH PROPOFOL N/A 03/15/2020   Procedure: ESOPHAGOGASTRODUODENOSCOPY (EGD) WITH PROPOFOL;  Surgeon: Jonathon Bellows, MD;  Location: [facility] ENDOSCOPY;  Service: Gastroenterology;  Laterality: N/A;    Prior to Admission medications   Medication Sig Start Date End Date Taking? Authorizing Provider  amLODipine (NORVASC) 10 MG tablet Take 10 mg by mouth daily. 09/17/19  Yes [provider]  aspirin 81 MG EC tablet Take by mouth. 02/08/20 02/07/21 Yes  [provider]  lisinopril (ZESTRIL) 5 MG tablet Take by mouth. 12/26/19 12/25/20 Yes [provider]  metFORMIN (GLUCOPHAGE) 500 MG tablet Take 500 mg by mouth 2 (two) times daily. 09/17/19  Yes [provider]  acetaminophen (TYLENOL) 500 MG tablet Take 1,000 mg by mouth every 8 (eight) hours as needed for pain. 04/25/18   [provider]  albuterol (VENTOLIN HFA) 108 (90 Base) MCG/ACT inhaler  12/01/19   [provider]  atorvastatin (LIPITOR) 80 MG tablet Take 80 mg by mouth daily. Patient not taking: Reported on 03/15/2020    [provider]  busPIRone (BUSPAR) 15 MG tablet Take 15 mg by mouth 3 (three) times daily.  Patient not taking: Reported on 03/15/2020 07/23/16   [provider]  clonazePAM (KLONOPIN) 1 MG tablet Take 1 mg by mouth 2 (two) times daily. Patient not taking: Reported on 03/15/2020    [provider]  diclofenac sodium (VOLTAREN) 1 % GEL Apply 2 g topically 4 (four) times daily. 01/10/17   [provider]  DULoxetine (CYMBALTA) 60 MG capsule Take 60 mg by mouth daily.     [provider]  ferrous sulfate (FERROUSUL) 325 (65 FE) MG tablet Take 325 mg by mouth daily with breakfast. 05/11/19 05/10/20  [provider]  gabapentin (NEURONTIN) 600 MG tablet Take by mouth. 02/08/20   [provider]  glipiZIDE (GLUCOTROL) 5 MG tablet Take 2.5-5 mg by mouth 2 (two) times daily before a meal. Take  one tablet (5mg ) by mouth at breakfast and one-half tablet (2.5mg ) at lunch    [provider]  omeprazole (PRILOSEC) 20 MG capsule Take 1 capsule (20 mg total) by mouth daily. Patient not taking: Reported on 03/15/2020 02/16/20   Jonathon Bellows, MD  pantoprazole (PROTONIX) 40 MG tablet Take 40 mg by mouth daily.  Patient not taking: Reported on 03/15/2020    [provider]  polyethylene glycol (GOLYTELY) 236 g solution Drink 8 oz every 20-30 minutes until entire prep is finished.  03/23/20   Jonathon Bellows, MD  polyethylene glycol powder [facility] 17 GM/SCOOP powder Take by mouth. 02/08/20   [provider]  QUEtiapine (SEROQUEL) 25 MG tablet Take 25 mg by mouth 3 (three) times daily. Take one tablet (25 mg) by mouth every morning, one tablet (25 mg) at noon and two tablets (50mg ) at bedtime    [provider]  traZODone (DESYREL) 50 MG tablet Take 50 mg by mouth at bedtime. 04/18/19   [provider]  ziprasidone (GEODON) 20 MG capsule Take 20 mg by mouth 2 (two) times daily. Patient not taking: Reported on 03/15/2020 08/23/18   [provider]    Allergies as of 03/24/2020 - Review Complete 03/15/2020  Allergen Reaction Noted  . Promethazine Other (See Comments) 12/11/2014  . Sumatriptan  02/12/2015  . Ciprofloxacin Other (See Comments) 12/11/2014  . Doxycycline Nausea And Vomiting 01/18/2015  . Imitrex [sumatriptan base] Other (See Comments) 07/01/2011  . Morphine and related Nausea And Vomiting 07/01/2011  . Penicillins Nausea And Vomiting and Other (See Comments) 01/18/2015  . Ranitidine hcl Other (See Comments) 12/11/2014  . Zantac [ranitidine hcl] Other (See Comments) 04/29/2015  . Ketorolac tromethamine Rash 07/01/2011    Family History  Problem Relation Age of Onset  . Hypertension Mother   . Heart attack Mother   . Diabetes Mother   . Anxiety disorder Mother   . Stroke Mother   . Diabetes Father   . Diabetes Brother   . Alcohol abuse Other        siblings  . Hypertension Brother   . Breast cancer Neg Hx     Social History   Socioeconomic History  . Marital status: Single    Spouse name: Not on file  . Number of children: 3  . Years of education: Not on file  . Highest education level: Not on file  Occupational History  . Not on file  Tobacco Use  . Smoking status: Current Every Day Smoker    Packs/day: 0.50    Years: 52.00    Pack years: 26.00    Types: Cigarettes  . Smokeless tobacco: Never Used   Vaping Use  . Vaping Use: Never used  Substance and Sexual Activity  . Alcohol use: Not Currently    Comment: glass of wine every once in a while   . Drug use: No  . Sexual activity: Not Currently  Other Topics Concern  . Not on file  Social History Narrative  . Not on file   Social Determinants of Health   Financial Resource Strain:   . Difficulty of Paying Living Expenses: Not on file  Food Insecurity:   . Worried About Charity fundraiser in the Last Year: Not on file  . Ran Out of Food in the Last Year: Not on file  Transportation Needs:   . Lack of Transportation (Medical): Not on file  . Lack of Transportation (Non-Medical): Not on file  Physical Activity:   .  Days of Exercise per Week: Not on file  . Minutes of Exercise per Session: Not on file  Stress:   . Feeling of Stress : Not on file  Social Connections:   . Frequency of Communication with Friends and Family: Not on file  . Frequency of Social Gatherings with Friends and Family: Not on file  . Attends Religious Services: Not on file  . Active Member of Clubs or Organizations: Not on file  . Attends Archivist Meetings: Not on file  . Marital Status: Not on file  Intimate Partner Violence:   . Fear of Current or Ex-Partner: Not on file  . Emotionally Abused: Not on file  . Physically Abused: Not on file  . Sexually Abused: Not on file    Review of Systems: See HPI, otherwise negative ROS  Physical Exam: BP (!) 148/61   Pulse 75   Temp (!) 96.5 F (35.8 C) (Temporal)   Resp 17   Ht 5\' 1"  (1.549 m)   Wt 73.9 kg   SpO2 100%   BMI 30.80 kg/m  General:   Alert,  pleasant and cooperative in NAD Head:  Normocephalic and atraumatic. Neck:  Supple; no masses or thyromegaly. Lungs:  Clear throughout to auscultation, normal respiratory effort.    Heart:  +S1, +S2, Regular rate and rhythm, No edema. Abdomen:  Soft, nontender and nondistended. Normal bowel sounds, without guarding, and without  rebound.   Neurologic:  Alert and  oriented x4;  grossly normal neurologically.  Impression/Plan: Tammy Armstrong is here for an colonoscopy to be performed for rectal bleeding . Risks, benefits, limitations, and alternatives regarding  colonoscopy have been reviewed with the patient.  Questions have been answered.  All parties agreeable.   Jonathon Bellows, MD  04/11/2020, 8:42 AM

## 2020-04-11 NOTE — Transfer of Care (Signed)
Immediate Anesthesia Transfer of Care Note  Patient: Tammy Armstrong  Procedure(s) Performed: COLONOSCOPY WITH PROPOFOL (N/A )  Patient Location: PACU  Anesthesia Type:General  Level of Consciousness: sedated  Airway & Oxygen Therapy: Patient Spontanous Breathing and Patient connected to nasal cannula oxygen  Post-op Assessment: Report given to RN and Post -op Vital signs reviewed and stable  Post vital signs: Reviewed and stable  Last Vitals:  Vitals Value Taken Time  BP 88/72 04/11/20 0915  Temp 36.4 C 04/11/20 0856  Pulse 56 04/11/20 0915  Resp 19 04/11/20 0915  SpO2 99 % 04/11/20 0915  Vitals shown include unvalidated device data.  Last Pain:  Vitals:   04/11/20 0906  TempSrc:   PainSc: 0-No pain         Complications: No complications documented.

## 2020-04-11 NOTE — Op Note (Signed)
[facility] Gastroenterology Patient Name: Tammy Armstrong Procedure Date: 04/11/2020 8:47 AM MRN: [id number] Account #: [id number] Date of Birth: 08-14-1950 Admit Type: Outpatient Age: 69 Room: [facility] ENDO ROOM 4 Gender: Female Note Status: Finalized Procedure:             Colonoscopy Indications:           Rectal bleeding Providers:             Jonathon Bellows MD, MD Referring MD:          Gayland Curry MD, MD (Referring MD) Medicines:             Monitored Anesthesia Care Complications:         No immediate complications. Procedure:             Pre-Anesthesia Assessment:                        - Prior to the procedure, a History and Physical was                         performed, and patient medications, allergies and                         sensitivities were reviewed. The patient's tolerance                         of previous anesthesia was reviewed.                        - The risks and benefits of the procedure and the                         sedation options and risks were discussed with the                         patient. All questions were answered and informed                         consent was obtained.                        - ASA Grade Assessment: III - A patient with severe                         systemic disease.                        After obtaining informed consent, the colonoscope was                         passed under direct vision. Throughout the procedure,                         the patient's blood pressure, pulse, and oxygen                         saturations were monitored continuously. The                         Colonoscope was introduced through the anus with the  intention of advancing to the cecum. The scope was                         advanced to the sigmoid colon before the procedure was                         aborted. Medications were given. The colonoscopy was                         performed with ease.  The patient tolerated the                         procedure well. The quality of the bowel preparation                         was poor. Findings:      The perianal and digital rectal examinations were normal.      Solid stool was found in the rectum and in the sigmoid colon,       interfering with visualization. Impression:            - Preparation of the colon was poor.                        - Stool in the rectum and in the sigmoid colon.                        - No specimens collected. Recommendation:        - Discharge patient to home (with escort).                        - Resume previous diet.                        - Continue present medications.                        - Repeat colonoscopy in 4 weeks because the bowel                         preparation was suboptimal. Procedure Code(s):     --- Professional ---                        [phone], 53, Colonoscopy, flexible; diagnostic,                         including collection of specimen(s) by brushing or                         washing, when performed (separate procedure) Diagnosis Code(s):     --- Professional ---                        K62.5, Hemorrhage of anus and rectum CPT copyright 2019 American Medical Association. All rights reserved. The codes documented in this report are preliminary and upon coder review may  be revised to meet current compliance requirements. Jonathon Bellows, MD Jonathon Bellows MD, MD 04/11/2020 8:53:10 AM This report has been signed electronically. Number of Addenda: 0 Note Initiated On: 04/11/2020 8:47  AM Total Procedure Duration: 0 hours 0 minutes 36 seconds  Estimated Blood Loss:  Estimated blood loss: none.      [facility]

## 2020-04-11 NOTE — Progress Notes (Signed)
   04/11/20 0745  Clinical Encounter Type  Visited With Family  Visit Type Initial;Spiritual support  Referral From Chaplain  Consult/Referral To Chaplain  While rounding SDS waiting area, chaplain visited with pt's friend.Pt's friend said she is doing fine and all is well. Pt's friend said God is Good.

## 2020-04-11 NOTE — Anesthesia Postprocedure Evaluation (Signed)
Anesthesia Post Note  Patient: Tammy Armstrong  Procedure(s) Performed: COLONOSCOPY WITH PROPOFOL (N/A )  Patient location during evaluation: Endoscopy Anesthesia Type: General Level of consciousness: awake and alert Pain management: pain level controlled Vital Signs Assessment: post-procedure vital signs reviewed and stable Respiratory status: spontaneous breathing, nonlabored ventilation, respiratory function stable and patient connected to nasal cannula oxygen Cardiovascular status: blood pressure returned to baseline and stable Postop Assessment: no apparent nausea or vomiting Anesthetic complications: no   No complications documented.   Last Vitals:  Vitals:   04/11/20 0906 04/11/20 0916  BP: (!) 122/51 (!) 126/48  Pulse: (!) 55 (!) 56  Resp: 13 19  Temp:    SpO2: 99% 99%    Last Pain:  Vitals:   04/11/20 0916  TempSrc:   PainSc: 0-No pain                 Arita Miss

## 2020-04-11 NOTE — Anesthesia Preprocedure Evaluation (Signed)
Anesthesia Evaluation  Patient identified by MRN, date of birth, ID band Patient awake    Reviewed: Allergy & Precautions, NPO status , Patient's Chart, lab work & pertinent test results  History of Anesthesia Complications Negative for: history of anesthetic complications  Airway Mallampati: II  TM Distance: >3 FB Neck ROM: Limited    Dental  (+) Edentulous Upper, Edentulous Lower, Dental Advidsory Given   Pulmonary shortness of breath and with exertion, neg sleep apnea, COPD,  COPD inhaler, neg recent URI, Current SmokerPatient did not abstain from smoking.,  Never hospitalized for copd   Pulmonary exam normal breath sounds clear to auscultation       Cardiovascular Exercise Tolerance: Poor hypertension, Pt. on medications and Pt. on home beta blockers (-) angina+ Peripheral Vascular Disease  (-) Past MI and (-) Cardiac Stents Normal cardiovascular exam(-) dysrhythmias + Valvular Problems/Murmurs  Rhythm:Regular Rate:Normal  TTE 2017: Impressions:   - Normal LVF  Normal Wall Motion  EF=60%  Normal Right side  Normal ECHO. Normal study. No cardiac source of emboli was  indentified.    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 CVA, No Residual Symptoms    GI/Hepatic GERD  Medicated and Controlled,(+) Hepatitis -, C  Endo/Other  diabetes, Type 2  Renal/GU negative Renal ROS     Musculoskeletal  (+) Arthritis , Osteoarthritis,    Abdominal (+)  Abdomen: soft.    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[place]) 07/04/2011: Respiratory failure ([place]) 07/02/2011: Seizure ([place]) No date: Shortness of breath dyspnea No date: Stroke [facility])   Reproductive/Obstetrics                             Anesthesia Physical  Anesthesia Plan  ASA: III  Anesthesia Plan: General   Post-op Pain Management:    Induction: Intravenous  PONV Risk Score and Plan: 2 and Propofol infusion and TIVA  Airway Management Planned: Natural Airway and Nasal Cannula  Additional Equipment: None  Intra-op Plan:   Post-operative Plan:   Informed Consent: I have reviewed the patients History and Physical, chart, labs and discussed the procedure including the risks, benefits and alternatives for the proposed anesthesia with the patient or authorized representative who has indicated his/her understanding and acceptance.     Dental advisory given  Plan Discussed with: CRNA  Anesthesia Plan Comments: (Discussed risks of anesthesia with patient, including possibility of difficulty with spontaneous ventilation under anesthesia necessitating airway intervention, PONV, and rare risks such as cardiac or respiratory or neurological events. Patient understands.)        Anesthesia Quick Evaluation

## 2020-04-12 ENCOUNTER — Ambulatory Visit: Payer: Medicare Other | Admitting: Gastroenterology

## 2020-04-13 ENCOUNTER — Other Ambulatory Visit: Payer: Self-pay

## 2020-04-13 ENCOUNTER — Telehealth: Payer: Self-pay

## 2020-04-13 DIAGNOSIS — K625 Hemorrhage of anus and rectum: Secondary | ICD-10-CM

## 2020-04-13 MED ORDER — NA SULFATE-K SULFATE-MG SULF 17.5-3.13-1.6 GM/177ML PO SOLN: 1.0000 | Freq: Once | ORAL | 0 refills | Status: AC

## 2020-04-13 NOTE — Telephone Encounter (Signed)
Mailed patient new colonoscopy instructions. Called pt to inform her of new procedure scheduled. She verbalized understanding.

## 2020-04-21 ENCOUNTER — Ambulatory Visit: Payer: Medicare Other | Admitting: Gastroenterology

## 2020-05-06 ENCOUNTER — Other Ambulatory Visit: Admission: RE | Admit: 2020-05-06 | Payer: Medicare Other | Source: Ambulatory Visit

## 2020-05-09 ENCOUNTER — Ambulatory Visit: Payer: Medicare Other | Admitting: Gastroenterology

## 2020-05-20 ENCOUNTER — Telehealth: Payer: Self-pay | Admitting: Gastroenterology

## 2020-05-20 NOTE — Telephone Encounter (Signed)
Patient requesting to cancel procedure for 11.12.21 and would like a call back to resch after Christmas. Pt has canceled several times already FYI. Please cancel procedure and call pt back to discuss resch. Pt had ov with Dr. Vicente Males back on 7.27.21.

## 2020-06-03 ENCOUNTER — Ambulatory Visit: Admission: RE | Admit: 2020-06-03 | Payer: Medicare Other | Source: Home / Self Care | Admitting: Gastroenterology

## 2020-06-03 ENCOUNTER — Encounter: Admission: RE | Payer: Self-pay | Source: Home / Self Care

## 2020-06-03 SURGERY — COLONOSCOPY WITH PROPOFOL
Anesthesia: General

## 2020-06-23 ENCOUNTER — Ambulatory Visit: Payer: Medicare Other | Admitting: Gastroenterology

## 2020-11-04 ENCOUNTER — Other Ambulatory Visit: Payer: Self-pay | Admitting: Family Medicine

## 2020-11-04 DIAGNOSIS — Z1231 Encounter for screening mammogram for malignant neoplasm of breast: Secondary | ICD-10-CM

## 2020-11-04 DIAGNOSIS — Z780 Asymptomatic menopausal state: Secondary | ICD-10-CM

## 2020-11-08 ENCOUNTER — Encounter: Payer: Self-pay | Admitting: *Deleted

## 2020-12-02 ENCOUNTER — Other Ambulatory Visit (INDEPENDENT_AMBULATORY_CARE_PROVIDER_SITE_OTHER): Payer: Self-pay | Admitting: Nurse Practitioner

## 2020-12-02 DIAGNOSIS — I6523 Occlusion and stenosis of bilateral carotid arteries: Secondary | ICD-10-CM

## 2020-12-06 ENCOUNTER — Encounter (INDEPENDENT_AMBULATORY_CARE_PROVIDER_SITE_OTHER): Payer: Medicare Other

## 2020-12-06 ENCOUNTER — Ambulatory Visit (INDEPENDENT_AMBULATORY_CARE_PROVIDER_SITE_OTHER): Payer: Medicare Other | Admitting: Vascular Surgery

## 2021-01-09 ENCOUNTER — Institutional Professional Consult (permissible substitution): Payer: Medicare Other | Admitting: Pulmonary Disease

## 2021-05-01 ENCOUNTER — Emergency Department: Payer: Medicare Other

## 2021-05-01 ENCOUNTER — Other Ambulatory Visit: Payer: Self-pay

## 2021-05-01 ENCOUNTER — Observation Stay
Admission: EM | Admit: 2021-05-01 | Discharge: 2021-05-02 | Disposition: A | Discharge status: Home / Self Care | Payer: Medicare Other | Attending: Internal Medicine | Admitting: Internal Medicine

## 2021-05-01 DIAGNOSIS — E1165 Type 2 diabetes mellitus with hyperglycemia: Secondary | ICD-10-CM | POA: Insufficient documentation

## 2021-05-01 DIAGNOSIS — E119 Type 2 diabetes mellitus without complications: Secondary | ICD-10-CM

## 2021-05-01 DIAGNOSIS — K219 Gastro-esophageal reflux disease without esophagitis: Secondary | ICD-10-CM | POA: Diagnosis present

## 2021-05-01 DIAGNOSIS — F17210 Nicotine dependence, cigarettes, uncomplicated: Secondary | ICD-10-CM | POA: Diagnosis not present

## 2021-05-01 DIAGNOSIS — N390 Urinary tract infection, site not specified: Secondary | ICD-10-CM

## 2021-05-01 DIAGNOSIS — I10 Essential (primary) hypertension: Secondary | ICD-10-CM | POA: Insufficient documentation

## 2021-05-01 DIAGNOSIS — E7800 Pure hypercholesterolemia, unspecified: Secondary | ICD-10-CM | POA: Insufficient documentation

## 2021-05-01 DIAGNOSIS — F418 Other specified anxiety disorders: Secondary | ICD-10-CM | POA: Diagnosis present

## 2021-05-01 DIAGNOSIS — Z79899 Other long term (current) drug therapy: Secondary | ICD-10-CM | POA: Diagnosis not present

## 2021-05-01 DIAGNOSIS — F419 Anxiety disorder, unspecified: Secondary | ICD-10-CM | POA: Diagnosis present

## 2021-05-01 DIAGNOSIS — J449 Chronic obstructive pulmonary disease, unspecified: Secondary | ICD-10-CM | POA: Diagnosis not present

## 2021-05-01 DIAGNOSIS — Z23 Encounter for immunization: Secondary | ICD-10-CM | POA: Diagnosis not present

## 2021-05-01 DIAGNOSIS — A419 Sepsis, unspecified organism: Principal | ICD-10-CM | POA: Insufficient documentation

## 2021-05-01 DIAGNOSIS — R739 Hyperglycemia, unspecified: Secondary | ICD-10-CM | POA: Diagnosis present

## 2021-05-01 DIAGNOSIS — N3000 Acute cystitis without hematuria: Secondary | ICD-10-CM | POA: Insufficient documentation

## 2021-05-01 DIAGNOSIS — Z20822 Contact with and (suspected) exposure to covid-19: Secondary | ICD-10-CM | POA: Insufficient documentation

## 2021-05-01 DIAGNOSIS — Z7984 Long term (current) use of oral hypoglycemic drugs: Secondary | ICD-10-CM | POA: Diagnosis not present

## 2021-05-01 DIAGNOSIS — F17200 Nicotine dependence, unspecified, uncomplicated: Secondary | ICD-10-CM | POA: Diagnosis present

## 2021-05-01 LAB — URINALYSIS, COMPLETE (UACMP) WITH MICROSCOPIC
Bilirubin Urine: NEGATIVE
Glucose, UA: >=500 mg/dL — AB
Ketones, ur: 5 mg/dL — AB
Nitrite: POSITIVE — AB
Protein, ur: 30 mg/dL — AB
Specific Gravity, Urine: 1.021 (ref 1.005–1.030)
Squamous Epithelial / HPF: NONE SEEN (ref 0–5)
WBC, UA: >50 WBC/hpf — ABNORMAL HIGH (ref 0–5)
pH: 5 (ref 5.0–8.0)

## 2021-05-01 LAB — COMPREHENSIVE METABOLIC PANEL
ALT: 33 U/L (ref 0–44)
AST: 34 U/L (ref 15–41)
Albumin: 3.5 g/dL (ref 3.5–5.0)
Alkaline Phosphatase: 76 U/L (ref 38–126)
Anion gap: 10 (ref 5–15)
BUN: 15 mg/dL (ref 8–23)
CO2: 24 mmol/L (ref 22–32)
Calcium: 8.9 mg/dL (ref 8.9–10.3)
Chloride: 99 mmol/L (ref 98–111)
Creatinine, Ser: 0.87 mg/dL (ref 0.44–1.00)
GFR, Estimated: >60 mL/min (ref >60)
Glucose, Bld: 352 mg/dL — ABNORMAL HIGH (ref 70–99)
Potassium: 5 mmol/L (ref 3.5–5.1)
Sodium: 133 mmol/L — ABNORMAL LOW (ref 135–145)
Total Bilirubin: 0.9 mg/dL (ref 0.3–1.2)
Total Protein: 7 g/dL (ref 6.5–8.1)

## 2021-05-01 LAB — CBC WITH DIFFERENTIAL/PLATELET
Abs Immature Granulocytes: 0.11 10*3/uL — ABNORMAL HIGH (ref 0.00–0.07)
Basophils Absolute: 0.1 10*3/uL (ref 0.0–0.1)
Basophils Relative: 0 %
Eosinophils Absolute: 0.1 10*3/uL (ref 0.0–0.5)
Eosinophils Relative: 0 %
HCT: 42 % (ref 36.0–46.0)
Hemoglobin: 15.1 g/dL — ABNORMAL HIGH (ref 12.0–15.0)
Immature Granulocytes: 1 %
Lymphocytes Relative: 6 %
Lymphs Abs: 1.1 10*3/uL (ref 0.7–4.0)
MCH: 33.9 pg (ref 26.0–34.0)
MCHC: 36 g/dL (ref 30.0–36.0)
MCV: 94.4 fL (ref 80.0–100.0)
Monocytes Absolute: 1.2 10*3/uL — ABNORMAL HIGH (ref 0.1–1.0)
Monocytes Relative: 7 %
Neutro Abs: 15.4 10*3/uL — ABNORMAL HIGH (ref 1.7–7.7)
Neutrophils Relative %: 86 %
Platelets: 227 10*3/uL (ref 150–400)
RBC: 4.45 MIL/uL (ref 3.87–5.11)
RDW: 12.4 % (ref 11.5–15.5)
WBC: 18 10*3/uL — ABNORMAL HIGH (ref 4.0–10.5)
nRBC: 0 % (ref 0.0–0.2)

## 2021-05-01 LAB — RESP PANEL BY RT-PCR (FLU A&B, COVID) ARPGX2
Influenza A by PCR: NEGATIVE
Influenza B by PCR: NEGATIVE
SARS Coronavirus 2 by RT PCR: NEGATIVE

## 2021-05-01 LAB — LACTIC ACID, PLASMA
Lactic Acid, Venous: 2.7 mmol/L (ref 0.5–1.9)
Lactic Acid, Venous: 2.7 mmol/L (ref 0.5–1.9)

## 2021-05-01 LAB — PROTIME-INR
INR: 1.1 (ref 0.8–1.2)
Prothrombin Time: 14.3 seconds (ref 11.4–15.2)

## 2021-05-01 LAB — CBG MONITORING, ED: Glucose-Capillary: 361 mg/dL — ABNORMAL HIGH (ref 70–99)

## 2021-05-01 LAB — GLUCOSE, CAPILLARY: Glucose-Capillary: 199 mg/dL — ABNORMAL HIGH (ref 70–99)

## 2021-05-01 LAB — APTT: aPTT: 27 seconds (ref 24–36)

## 2021-05-01 LAB — PROCALCITONIN: Procalcitonin: 1.79 ng/mL

## 2021-05-01 LAB — HIV ANTIBODY (ROUTINE TESTING W REFLEX): HIV Screen 4th Generation wRfx: NONREACTIVE

## 2021-05-01 MED ORDER — DULOXETINE HCL 30 MG PO CPEP
60.0000 mg | ORAL_CAPSULE | Freq: Every day | ORAL | Status: DC
  Administered 2021-05-02: 60 mg via ORAL
  Filled 2021-05-01: qty 2

## 2021-05-01 MED ORDER — SODIUM CHLORIDE 0.9 % IV BOLUS
1000.0000 mL | Freq: Once | INTRAVENOUS | Status: AC
  Administered 2021-05-01: 1000 mL via INTRAVENOUS

## 2021-05-01 MED ORDER — ENOXAPARIN SODIUM 40 MG/0.4ML IJ SOSY
0.5000 mg/kg | PREFILLED_SYRINGE | INTRAMUSCULAR | Status: DC
  Administered 2021-05-01: 40 mg via SUBCUTANEOUS
  Filled 2021-05-01: qty 0.4

## 2021-05-01 MED ORDER — GABAPENTIN 600 MG PO TABS
600.0000 mg | ORAL_TABLET | Freq: Every day | ORAL | Status: DC
  Administered 2021-05-02: 600 mg via ORAL
  Filled 2021-05-01: qty 1

## 2021-05-01 MED ORDER — ONDANSETRON HCL 4 MG PO TABS: 4.0000 mg | ORAL_TABLET | Freq: Four times a day (QID) | ORAL | Status: DC | PRN

## 2021-05-01 MED ORDER — AMLODIPINE BESYLATE 10 MG PO TABS
10.0000 mg | ORAL_TABLET | Freq: Every day | ORAL | Status: DC
  Administered 2021-05-02: 10 mg via ORAL
  Filled 2021-05-01: qty 1

## 2021-05-01 MED ORDER — ALBUTEROL SULFATE (2.5 MG/3ML) 0.083% IN NEBU: 3.0000 mL | INHALATION_SOLUTION | Freq: Four times a day (QID) | RESPIRATORY_TRACT | Status: DC | PRN

## 2021-05-01 MED ORDER — ATORVASTATIN CALCIUM 20 MG PO TABS
80.0000 mg | ORAL_TABLET | Freq: Every day | ORAL | Status: DC
  Administered 2021-05-02: 10:00:00 80 mg via ORAL
  Filled 2021-05-01: qty 4

## 2021-05-01 MED ORDER — NICOTINE 21 MG/24HR TD PT24: 21.0000 mg | MEDICATED_PATCH | Freq: Every day | TRANSDERMAL | Status: DC | PRN

## 2021-05-01 MED ORDER — INSULIN ASPART 100 UNIT/ML IJ SOLN
0.0000 [IU] | Freq: Three times a day (TID) | INTRAMUSCULAR | Status: DC
  Administered 2021-05-02: 8 [IU] via SUBCUTANEOUS
  Filled 2021-05-01: qty 1

## 2021-05-01 MED ORDER — SODIUM CHLORIDE 0.9 % IV BOLUS (SEPSIS): 1000.0000 mL | Freq: Once | INTRAVENOUS | Status: DC

## 2021-05-01 MED ORDER — METOPROLOL SUCCINATE ER 50 MG PO TB24
50.0000 mg | ORAL_TABLET | Freq: Every day | ORAL | Status: DC
  Administered 2021-05-01 – 2021-05-02 (×2): 50 mg via ORAL
  Filled 2021-05-01 (×2): qty 1

## 2021-05-01 MED ORDER — TIZANIDINE HCL 2 MG PO TABS
2.0000 mg | ORAL_TABLET | Freq: Two times a day (BID) | ORAL | Status: DC | PRN
  Filled 2021-05-01 (×2): qty 1

## 2021-05-01 MED ORDER — INSULIN ASPART 100 UNIT/ML IJ SOLN
0.0000 [IU] | Freq: Every day | INTRAMUSCULAR | Status: DC
Start: 2021-05-01 — End: 2021-05-02

## 2021-05-01 MED ORDER — SODIUM CHLORIDE 0.9 % IV BOLUS (SEPSIS)
500.0000 mL | Freq: Once | INTRAVENOUS | Status: AC
  Administered 2021-05-01: 500 mL via INTRAVENOUS

## 2021-05-01 MED ORDER — SODIUM CHLORIDE 0.9 % IV SOLN: Freq: Once | INTRAVENOUS | Status: AC

## 2021-05-01 MED ORDER — INSULIN ASPART 100 UNIT/ML IJ SOLN
6.0000 [IU] | Freq: Once | INTRAMUSCULAR | Status: AC
  Administered 2021-05-01: 6 [IU] via INTRAVENOUS
  Filled 2021-05-01: qty 1

## 2021-05-01 MED ORDER — QUETIAPINE FUMARATE 25 MG PO TABS
25.0000 mg | ORAL_TABLET | Freq: Two times a day (BID) | ORAL | Status: DC
  Administered 2021-05-02: 10:00:00 25 mg via ORAL
  Filled 2021-05-01: qty 1

## 2021-05-01 MED ORDER — SODIUM CHLORIDE 0.9 % IV SOLN: 2.0000 g | INTRAVENOUS | Status: DC

## 2021-05-01 MED ORDER — ZIPRASIDONE HCL 20 MG PO CAPS
20.0000 mg | ORAL_CAPSULE | Freq: Two times a day (BID) | ORAL | Status: DC
  Administered 2021-05-01 – 2021-05-02 (×2): 20 mg via ORAL
  Filled 2021-05-01 (×4): qty 1

## 2021-05-01 MED ORDER — SODIUM CHLORIDE 0.9 % IV SOLN
500.0000 mg | INTRAVENOUS | Status: DC
  Administered 2021-05-01: 500 mg via INTRAVENOUS
  Filled 2021-05-01 (×2): qty 500

## 2021-05-01 MED ORDER — ACETAMINOPHEN 325 MG PO TABS: 650.0000 mg | ORAL_TABLET | Freq: Four times a day (QID) | ORAL | Status: DC | PRN

## 2021-05-01 MED ORDER — ONDANSETRON HCL 4 MG/2ML IJ SOLN
4.0000 mg | Freq: Once | INTRAMUSCULAR | Status: AC
  Administered 2021-05-01: 4 mg via INTRAVENOUS
  Filled 2021-05-01: qty 2

## 2021-05-01 MED ORDER — ONDANSETRON HCL 4 MG/2ML IJ SOLN: 4.0000 mg | Freq: Four times a day (QID) | INTRAMUSCULAR | Status: DC | PRN

## 2021-05-01 MED ORDER — ACETAMINOPHEN 325 MG PO TABS
650.0000 mg | ORAL_TABLET | Freq: Once | ORAL | Status: AC
  Administered 2021-05-01: 650 mg via ORAL
  Filled 2021-05-01: qty 2

## 2021-05-01 MED ORDER — BUSPIRONE HCL 15 MG PO TABS
15.0000 mg | ORAL_TABLET | Freq: Three times a day (TID) | ORAL | Status: DC
  Administered 2021-05-01 – 2021-05-02 (×2): 15 mg via ORAL
  Filled 2021-05-01 (×4): qty 1

## 2021-05-01 MED ORDER — INFLUENZA VAC A&B SA ADJ QUAD 0.5 ML IM PRSY
0.5000 mL | PREFILLED_SYRINGE | INTRAMUSCULAR | Status: AC
  Administered 2021-05-02: 0.5 mL via INTRAMUSCULAR
  Filled 2021-05-01: qty 0.5

## 2021-05-01 MED ORDER — LISINOPRIL 10 MG PO TABS
10.0000 mg | ORAL_TABLET | Freq: Two times a day (BID) | ORAL | Status: DC
  Administered 2021-05-01 – 2021-05-02 (×2): 10 mg via ORAL
  Filled 2021-05-01 (×2): qty 1

## 2021-05-01 MED ORDER — METFORMIN HCL 500 MG PO TABS
500.0000 mg | ORAL_TABLET | Freq: Two times a day (BID) | ORAL | Status: DC
  Administered 2021-05-02: 500 mg via ORAL
  Filled 2021-05-01 (×2): qty 1

## 2021-05-01 MED ORDER — VALBENAZINE TOSYLATE 40 MG PO CAPS
80.0000 mg | ORAL_CAPSULE | Freq: Every day | ORAL | Status: DC
  Administered 2021-05-02: 80 mg via ORAL
  Filled 2021-05-01: qty 2

## 2021-05-01 MED ORDER — ACETAMINOPHEN 650 MG RE SUPP: 650.0000 mg | Freq: Four times a day (QID) | RECTAL | Status: DC | PRN

## 2021-05-01 MED ORDER — QUETIAPINE FUMARATE 25 MG PO TABS
50.0000 mg | ORAL_TABLET | Freq: Every day | ORAL | Status: DC
  Administered 2021-05-01: 50 mg via ORAL
  Filled 2021-05-01: qty 2

## 2021-05-01 MED ORDER — CLONAZEPAM 0.5 MG PO TABS
1.0000 mg | ORAL_TABLET | Freq: Every evening | ORAL | Status: DC | PRN
  Administered 2021-05-01: 1 mg via ORAL
  Filled 2021-05-01: qty 2

## 2021-05-01 MED ORDER — TRAZODONE HCL 50 MG PO TABS
50.0000 mg | ORAL_TABLET | Freq: Every day | ORAL | Status: DC
  Administered 2021-05-01: 22:00:00 50 mg via ORAL
  Filled 2021-05-01: qty 1

## 2021-05-01 MED ORDER — SODIUM CHLORIDE 0.9 % IV SOLN
2.0000 g | INTRAVENOUS | Status: DC
  Administered 2021-05-01: 2 g via INTRAVENOUS
  Filled 2021-05-01 (×2): qty 20

## 2021-05-01 MED ORDER — FERROUS SULFATE 325 (65 FE) MG PO TABS
325.0000 mg | ORAL_TABLET | Freq: Every day | ORAL | Status: DC
  Administered 2021-05-02: 325 mg via ORAL
  Filled 2021-05-01: qty 1

## 2021-05-01 MED ORDER — SODIUM CHLORIDE 0.9 % IV BOLUS (SEPSIS)
1000.0000 mL | Freq: Once | INTRAVENOUS | Status: AC
  Administered 2021-05-01: 1000 mL via INTRAVENOUS

## 2021-05-01 NOTE — ED Notes (Addendum)
MD notified of inability to obtain 2nd PIV. Per MD, OK to just have one IV for now.

## 2021-05-01 NOTE — H&P (Addendum)
History and Physical   Tammy Armstrong GQQ:[id number] DOB: 1951-06-15 DOA: 05/01/2021  PCP: Gayland Curry, MD  Outpatient Specialists: Dr. Vicente Males, GI Patient coming from: Home via EMS  I have personally briefly reviewed patient's old medical records in [place].  Chief Concern: Hyperglycemia  HPI: Tammy Armstrong is a 70 y.o. female with medical history significant for hypertension, GERD, hypercholesterolemia, tobacco use disorder, non-insulin-dependent diabetes mellitus, depression, anxiety, COPD, who presents emergency department for chief concerns of hyperglycemia.  She reports she has been feeling weak for about 1 week.  She also endorses generalized malaise and fatigue.  She denies dysuria.  She endorses that she has been having urinary urgency for about 1 week.  She denies hematuria.  She endorses 1 day of nausea.  She denies vomiting, shortness of breath, chest pain.  She denies cough or known sick contacts.  She endorses diarrhea, 5 episodes of watery nonbloody bowel movements.  She denies diet changes.  She denies recent antibiotic use.  At bedside, patient is able to tell me her full name, age, current calendar year, and location of hospital.  Social history: Patient states she lives at home by herself.  At baseline she ambulates with a walker.  She endorses tobacco use and she started at age 50.  She smokes 1 pack/day.  She denies EtOH and/or recreational drug use.  Vaccination history: She has received 4 doses of Moderna.  ROS: Constitutional: no weight change, no fever ENT/Mouth: no sore throat, no rhinorrhea Eyes: no eye pain, no vision changes Cardiovascular: no chest pain, no dyspnea,  no edema, no palpitations Respiratory: no cough, no sputum, no wheezing Gastrointestinal: no nausea, no vomiting, no diarrhea, no constipation Genitourinary: no urinary incontinence, no dysuria, no hematuria Musculoskeletal: no arthralgias, no myalgias Skin: no skin lesions, no  pruritus, Neuro: + weakness, no loss of consciousness, no syncope Psych: no anxiety, no depression, + decrease appetite Heme/Lymph: no bruising, no bleeding  ED Course: Discussed with emergency medicine provider, patient requiring hospitalization for chief concerns of meeting sepsis criteria.  Vitals in the emergency department was remarkable for T max of 102.4, respiration rate of 20, blood pressure 154/64, improved to 126/43.  Patient's SPO2 is 98% on room air.  Labs in the emergency department was remarkable for sodium 133, potassium 5.0, chloride 99, bicarb 24, BUN of 15, serum creatinine of 0.87, nonfasting blood glucose 352.  WBC is 18. Hemoglobin is 15.1. Platelets is 227. eGFR is greater than 60. Lactic acid elevated at 2.7.  COVID/influenza A/influenza B PCR were negative.  UA was positive for leukocytes and nitrites.  Patient was given acetaminophen 650 mg p.o., aspart 6 units, ceftriaxone 2 g IV, sodium chloride 1 L bolus.  Assessment/Plan  Principal Problem:   Sepsis due to urinary tract infection (HCC) Active Problems:   Diabetes mellitus type 2, noninsulin dependent (HCC)   Anxiety   COPD (chronic obstructive pulmonary disease) (HCC)   Tobacco use disorder   Essential hypertension   Depression with anxiety   Gastroesophageal reflux disease without esophagitis   Hypercholesterolemia   Hyperglycemia due to diabetes mellitus ([place])   # Sepsis - Etiology work-up in progress - Patient is status post sodium chloride 1 L bolus - Give sepsis bolus - Check procalcitonin - Urine culture, blood cultures x2 are in process - Ceftriaxone 2 g IV every 24 hours - Procalcitonin was positive, add azithromycin 500 mg IV - Aspiration precautions  # Hypertension-resumed amlodipine 10 mg daily # Hyperlipidemia-atorvastatin 80  mg daily resumed # Tobacco dependence-ordered nicotine patch daily as needed for nicotine craving  # Non-insulin-dependent diabetes mellitus-insulin SSI  with at bedtime coverage ordered - Resumed home metformin 500 mg twice daily with meals - Glipizide has been held at this time - Goal blood glucose while in the hospital is 140-180  # GERD-PPI  # Insomnia - resumed home clonazepam 1 mg p.o. nightly as needed for insomnia and anxiety  # COPD - resumed albuterol 3 mL inhalation every 6 hours as needed for wheezing and shortness of breath  # Depression/anxiety/psychiatric imbalance - Med reconciliation is still pending - I personally requested med reconciliation - I personally called pharmacy, regarding the multiple central nervous system medication the patient including buspirone 15 mg 3 times daily, duloxetine 60 mg daily, gabapentin 600 mg tablet, quetiapine 25 mg 3 times daily, tizanidine 2 mg twice daily as needed, valbenazine 80 mg daily, ziprasidone 20 mg capsule twice daily - Family medicine note on 03/29/2021: Has quetiapine listed as 25 mg tablet, with no dosing interval listed and valbenazine, ziprasidone, duloxetine, buspirone are not listed as a medication - A.m. team to complete med reconciliation  Chart reviewed.   DVT prophylaxis: Enoxaparin 40 mg Code Status:  full Diet: Heart healthy/carb modified Family Communication: No Disposition Plan: Pending clinical course Consults called: None at this time Admission status: Observation, MedSurg, telemetry  Past Medical History:  Diagnosis Date   Anxiety    Arthritis    Carotid artery occlusion    Cigarette smoker one half pack a day or less    COPD (chronic obstructive pulmonary disease) ([place]) 07/04/2011   Depression    Diabetes mellitus    Diabetes mellitus 07/04/2011   GERD (gastroesophageal reflux disease)    Hepatitis C    Hypercholesterolemia    Hypertension    Migraines    Pancreatitis    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   Respiratory failure ([place]) 07/04/2011   Seizure ([place]) 07/02/2011   Shortness of breath dyspnea    Stroke [facility])    Past Surgical  History:  Procedure Laterality Date   ABDOMINAL HYSTERECTOMY     APPENDECTOMY     BACK SURGERY  2012   cyst removed from spine , [place]   CHOLECYSTECTOMY     COLONOSCOPY WITH PROPOFOL N/A 03/15/2020   Procedure: COLONOSCOPY WITH PROPOFOL;  Surgeon: Jonathon Bellows, MD;  Location: [facility] ENDOSCOPY;  Service: Gastroenterology;  Laterality: N/A;   COLONOSCOPY WITH PROPOFOL N/A 04/11/2020   Procedure: COLONOSCOPY WITH PROPOFOL;  Surgeon: Jonathon Bellows, MD;  Location: [facility] ENDOSCOPY;  Service: Gastroenterology;  Laterality: N/A;   ENDARTERECTOMY Left 05/18/2015   Procedure: ENDARTERECTOMY CAROTID;  Surgeon: Algernon Huxley, MD;  Location: [facility] ORS;  Service: Vascular;  Laterality: Left;   ESOPHAGOGASTRODUODENOSCOPY (EGD) WITH PROPOFOL N/A 03/15/2020   Procedure: ESOPHAGOGASTRODUODENOSCOPY (EGD) WITH PROPOFOL;  Surgeon: Jonathon Bellows, MD;  Location: [facility] ENDOSCOPY;  Service: Gastroenterology;  Laterality: N/A;   Social History:  reports that she has been smoking cigarettes. She has a 26.00 pack-year smoking history. She has never used smokeless tobacco. She reports that she does not currently use alcohol. She reports that she does not use drugs.  Allergies  Allergen Reactions   Promethazine Other (See Comments)    studdering Other Reaction: IMPAIRS SPEECH, UNABLE TO VERB studdering Other Reaction: IMPAIRS SPEECH, UNABLE TO VERB    Sumatriptan     Other reaction(s): Other (See Comments) Other Reaction: PANIC ATTACK   Ciprofloxacin Other (See Comments)    Medication is  contraindicated with some of pts other meds.   Delirium Delirium    Doxycycline Nausea And Vomiting   Imitrex [Sumatriptan Base] Other (See Comments)    Pt states that it "puts her out of this world".     Morphine And Related Nausea And Vomiting   Penicillins Nausea And Vomiting and Other (See Comments)    Pt is unable to answer additional questions about this medication.     Ranitidine Hcl Other (See Comments)     studdering studdering unknown    Zantac [Ranitidine Hcl] Other (See Comments)    Reaction:  Unknown    Ketorolac Tromethamine Rash   Family History  Problem Relation Age of Onset   Hypertension Mother    Heart attack Mother    Diabetes Mother    Anxiety disorder Mother    Stroke Mother    Diabetes Father    Diabetes Brother    Alcohol abuse Other        siblings   Hypertension Brother    Breast cancer Neg Hx    Family history: Family history reviewed and not pertinent  Prior to Admission medications   Medication Sig Start Date End Date Taking? Authorizing Provider  amLODipine (NORVASC) 10 MG tablet Take 10 mg by mouth daily. 09/17/19  Yes [provider]  atorvastatin (LIPITOR) 80 MG tablet Take 80 mg by mouth daily.   Yes [provider]  clonazePAM (KLONOPIN) 1 MG tablet Take 1 mg by mouth at bedtime as needed.   Yes [provider]  nystatin (MYCOSTATIN/NYSTOP) powder Apply 1 application topically 2 (two) times daily.   Yes [provider]  nystatin cream (MYCOSTATIN) Apply 1 application topically 2 (two) times daily. 04/21/21  Yes [provider]  OZEMPIC, 0.25 OR 0.5 MG/DOSE, 2 MG/1.5ML SOPN Inject 0.25 mg into the skin once a week. 03/29/21  Yes [provider]  valbenazine (INGREZZA) 80 MG capsule Take 80 mg by mouth daily.   Yes [provider]  acetaminophen (TYLENOL) 500 MG tablet Take 1,000 mg by mouth every 8 (eight) hours as needed for pain. 04/25/18   [provider]  albuterol (VENTOLIN HFA) 108 (90 Base) MCG/ACT inhaler  12/01/19   [provider]  busPIRone (BUSPAR) 15 MG tablet Take 15 mg by mouth 3 (three) times daily.  Patient not taking: Reported on 03/15/2020 07/23/16   [provider]  diclofenac sodium (VOLTAREN) 1 % GEL Apply 2 g topically 4 (four) times daily. 01/10/17   [provider]  DULoxetine (CYMBALTA) 60 MG capsule Take 60 mg by mouth daily.     [provider]  ferrous sulfate (FERROUSUL) 325 (65 FE) MG tablet Take 325 mg by mouth daily with breakfast. 05/11/19 05/10/20  [provider]  gabapentin (NEURONTIN) 600 MG tablet Take by mouth. 02/08/20   [provider]  glipiZIDE (GLUCOTROL) 5 MG tablet Take 2.5-5 mg by mouth 2 (two) times daily before a meal. Take one tablet (79m) by mouth at breakfast and one-half tablet (2.517m at lunch    [provider]  metFORMIN (GLUCOPHAGE) 500 MG tablet Take 500 mg by mouth 2 (two) times daily. 09/17/19   [provider]  omeprazole (PRILOSEC) 20 MG capsule Take 1 capsule (20 mg total) by mouth daily. Patient not taking: Reported on 03/15/2020 02/16/20   AnJonathon BellowsMD  pantoprazole (PROTONIX) 40 MG tablet Take 40 mg by mouth daily.  Patient not taking: Reported on 03/15/2020    [provider]  polyethylene glycol (GOLYTELY) 236 g solution Drink 8 oz every 20-30 minutes until entire prep is finished. 03/23/20   Jonathon Bellows, MD  polyethylene glycol powder [facility]) 17 GM/SCOOP powder Take by mouth. 02/08/20   [provider]  QUEtiapine (SEROQUEL) 25 MG tablet Take 25 mg by mouth 3 (three) times daily. Take one tablet (25 mg) by mouth every morning, one tablet (25 mg) at noon and two tablets (17m) at bedtime    [provider]  traZODone (DESYREL) 50 MG tablet Take 50 mg by mouth at bedtime. 04/18/19   [provider]  ziprasidone (GEODON) 20 MG capsule Take 20 mg by mouth 2 (two) times daily. Patient not taking: Reported on 03/15/2020 08/23/18   [provider]   Physical Exam: Vitals:   05/01/21 1035 05/01/21 1106 05/01/21 1112 05/01/21 1230  BP:  (!) 124/31 124/62 (!) 126/43  Pulse:  87  83  Resp:  18  17  Temp: (!) 102.4 F (39.1 C)     TempSrc: Rectal     SpO2:  98%  98%  Weight:      Height:       Constitutional: appears older than chronological age, NAD, calm, comfortable Eyes: PERRL, lids and  conjunctivae normal ENMT: Mucous membranes are moist. Posterior pharynx clear of any exudate or lesions.  Poor dentition. Hearing appropriate Neck: normal, supple, no masses, no thyromegaly Respiratory: clear to auscultation bilaterally, no wheezing, no crackles. Normal respiratory effort. No accessory muscle use.  Cardiovascular: Regular rate and rhythm, no murmurs / rubs / gallops. No extremity edema. 2+ pedal pulses. No carotid bruits.  Abdomen: no tenderness, no masses palpated, no hepatosplenomegaly. Bowel sounds positive.  Musculoskeletal: no clubbing / cyanosis. No joint deformity upper and lower extremities. Good ROM, no contractures, no atrophy. Normal muscle tone.  Skin: no rashes, lesions, ulcers. No induration Neurologic: Sensation intact. Strength 5/5 in all 4.  Psychiatric: Normal judgment and insight. Alert and oriented x 3. Normal mood.   EKG: independently reviewed, showing sinus rhythm with rate of 87, QTc 493  Chest x-ray on Admission: I personally reviewed and I agree with radiologist reading as below.  DG Chest Port 1 View  Result Date: 05/01/2021 CLINICAL DATA:  70year old female with history of possible sepsis. EXAM: PORTABLE CHEST 1 VIEW COMPARISON:  Chest x-ray 08/22/2019. FINDINGS: Lung volumes are normal. No consolidative airspace disease. No pleural effusions. No pneumothorax. No pulmonary nodule or mass noted. Pulmonary vasculature and the cardiomediastinal silhouette are within normal limits. Atherosclerosis in the thoracic aorta. IMPRESSION: 1. No radiographic evidence of acute cardiopulmonary disease. 2. Aortic atherosclerosis. Electronically Signed   By: DVinnie LangtonM.D.   On: 05/01/2021 11:16    Labs on Admission: I have personally reviewed following labs  CBC: Recent Labs  Lab 05/01/21 1036  WBC 18.0*  NEUTROABS 15.4*  HGB 15.1*  HCT 42.0  MCV 94.4  PLT 2929  Basic Metabolic Panel: Recent Labs  Lab 05/01/21 1036  NA 133*  K 5.0  CL 99   CO2 24  GLUCOSE 352*  BUN 15  CREATININE 0.87  CALCIUM 8.9   GFR: Estimated Creatinine Clearance: 57.8 mL/min (by C-G formula based on SCr of 0.87 mg/dL).  Liver Function Tests: Recent Labs  Lab 05/01/21 1036  AST 34  ALT 33  ALKPHOS 76  BILITOT 0.9  PROT 7.0  ALBUMIN 3.5   Coagulation Profile: Recent Labs  Lab 05/01/21 1110  INR 1.1   CBG: Recent Labs  Lab 05/01/21 1018  GLUCAP 361*   Urine analysis:    Component Value Date/Time   COLORURINE YELLOW (A) 05/01/2021 1036   APPEARANCEUR CLOUDY (A) 05/01/2021 1036   APPEARANCEUR Clear 07/10/2014 1749   LABSPEC 1.021 05/01/2021 1036   LABSPEC 1.025 07/10/2014 1749   PHURINE 5.0 05/01/2021 1036   GLUCOSEU >=500 (A) 05/01/2021 1036   GLUCOSEU Negative 07/10/2014 1749   HGBUR SMALL (A) 05/01/2021 1036   BILIRUBINUR NEGATIVE 05/01/2021 1036   BILIRUBINUR Negative 07/10/2014 1749   KETONESUR 5 (A) 05/01/2021 1036   PROTEINUR 30 (A) 05/01/2021 1036   UROBILINOGEN 1.0 07/02/2011 1005   NITRITE POSITIVE (A) 05/01/2021 1036   LEUKOCYTESUR SMALL (A) 05/01/2021 1036   LEUKOCYTESUR Negative 07/10/2014 1749   Dr. Tobie Poet [facility]  If 7PM-7AM, please contact overnight-coverage provider If 7AM-7PM, please contact day coverage provider [email or web address]  05/01/2021, 1:16 PM

## 2021-05-01 NOTE — ED Notes (Signed)
This RN attempted X 2 for additional PIV access without success. Lab called for 2nd set of blood cultures.

## 2021-05-01 NOTE — ED Provider Notes (Signed)
[facility] Emergency Department Provider Note    Event Date/Time   First MD Initiated Contact with Patient 05/01/21 1024     (approximate)  I have reviewed the triage vital signs and the nursing notes.   HISTORY  Chief Complaint Hyperglycemia    HPI Tammy Armstrong is a 70 y.o. female below listed past medical history presents to the ER for evaluation of generalized malaise and concerned that her blood sugars been running high.  Denies any focal pain has had some foul-smelling urine and states that she is not going as frequently as previous.  Denies any abdominal pain or chest pain.    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   Respiratory failure ([place]) 07/04/2011   Seizure ([place]) 07/02/2011   Shortness of breath dyspnea    Stroke ([place])    Family History  Problem Relation Age of Onset   Hypertension Mother    Heart attack Mother    Diabetes Mother    Anxiety disorder Mother    Stroke Mother    Diabetes Father    Diabetes Brother    Alcohol abuse Other        siblings   Hypertension Brother    Breast cancer Neg Hx    Past Surgical History:  Procedure Laterality Date   ABDOMINAL HYSTERECTOMY     APPENDECTOMY     BACK SURGERY  2012   cyst removed from spine ,    CHOLECYSTECTOMY     COLONOSCOPY WITH PROPOFOL N/A 03/15/2020   Procedure: COLONOSCOPY WITH PROPOFOL;  Surgeon: Jonathon Bellows, MD;  Location[facility] ENDOSCOPY;  Service: Gastroenterology;  Laterality: N/A;   COLONOSCOPY WITH PROPOFOL N/A 04/11/2020   Procedure: COLONOSCOPY WITH PROPOFOL;   Surgeon: Jonathon Bellows, MD;  Location: [facility] ENDOSCOPY;  Service: Gastroenterology;  Laterality: N/A;   ENDARTERECTOMY Left 05/18/2015   Procedure: ENDARTERECTOMY CAROTID;  Surgeon: Algernon Huxley, MD;  Location: [facility] ORS;  Service: Vascular;  Laterality: Left;   ESOPHAGOGASTRODUODENOSCOPY (EGD) WITH PROPOFOL N/A 03/15/2020   Procedure: ESOPHAGOGASTRODUODENOSCOPY (EGD) WITH PROPOFOL;  Surgeon: Jonathon Bellows, MD;  Location: [facility] ENDOSCOPY;  Service: Gastroenterology;  Laterality: N/A;   Patient Active Problem List   Diagnosis Date Noted   Sepsis due to urinary tract infection ([place]) 05/01/2021   Hyperglycemia due to diabetes mellitus ([place]) 05/01/2021   Symptomatic anemia 10/27/2019   Acute blood loss anemia 10/27/2019   Lower GI bleed 10/27/2019   History of deep venous thrombosis 09/02/2019   Syncope 05/27/2019   Nausea & vomiting 05/27/2019   Essential hypertension 05/27/2019   AKI (acute kidney injury) ([place])    Adverse reaction to NSAIDs, sequela 05/13/2019   Encounter for screening involving social determinants of health (SDoH) 11/10/2018   Other cirrhosis of liver ([place]) 11/10/2018   PNA (pneumonia) 10/12/2018   Mixed stress and urge urinary incontinence 08/26/2018   Meningioma ([place]) 05/15/2018   Abnormal brain MRI 12/25/2017   Unstable gait 05/15/2017   Murmur, heart 08/26/2016   Tobacco use disorder 07/13/2016  Carotid aneurysm, 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   Right upper extremity numbness    TIA (transient ischemic attack) 06/27/2016   Bipolar disorder, mixed ([place]) 04/17/2016   Depression with anxiety 04/17/2016   Gastroesophageal reflux disease without esophagitis 04/17/2016   Carotid stenosis 04/08/2015   Migraine 04/06/2015   Chronic low back pain 02/12/2015   Hypercholesterolemia 02/12/2015   Tobacco dependence syndrome 02/12/2015   Dementia with behavioral disturbance ([place]) 01/19/2015   History of anticoagulant therapy 01/25/2014   Cannabis abuse 10/01/2013   Personal history of  other infectious and parasitic diseases 01/14/2013   Drug dependence ([place]) 08/07/2011   Diabetes mellitus type 2, noninsulin dependent ([place]) 07/04/2011   Anxiety 07/04/2011   COPD (chronic obstructive pulmonary disease) ([place]) 07/04/2011   Purulent bronchitis (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 Authorizing Provider  amLODipine (NORVASC) 10 MG tablet Take 10 mg by mouth daily. 09/17/19  Yes [provider]  atorvastatin (LIPITOR) 80 MG tablet Take 80 mg by mouth daily.   Yes [provider]  clonazePAM (KLONOPIN) 1 MG tablet Take 1 mg by mouth at bedtime as needed.   Yes [provider]  nystatin (MYCOSTATIN/NYSTOP) powder Apply 1 application topically 2 (two) times daily.   Yes [provider]  nystatin cream (MYCOSTATIN) Apply 1 application topically 2 (two) times daily. 04/21/21  Yes [provider]  OZEMPIC, 0.25 OR 0.5 MG/DOSE, 2 MG/1.5ML SOPN Inject 0.25 mg into the skin once a week. 03/29/21  Yes [provider]  valbenazine (INGREZZA) 80 MG capsule Take 80 mg by mouth daily.   Yes [provider]  acetaminophen (TYLENOL) 500 MG tablet Take 1,000 mg by mouth every 8 (eight) hours as needed for pain. 04/25/18   [provider]  albuterol (VENTOLIN HFA) 108 (90 Base) MCG/ACT inhaler  12/01/19   [provider]  busPIRone (BUSPAR) 15 MG tablet Take 15 mg by mouth 3 (three) times daily.  Patient not taking: Reported on 03/15/2020 07/23/16   [provider]  diclofenac sodium (VOLTAREN) 1 % GEL Apply 2 g topically 4 (four) times daily. 01/10/17   [provider]  DULoxetine (CYMBALTA) 60 MG capsule Take 60 mg by mouth daily.     [provider]  ferrous sulfate (FERROUSUL) 325 (65 FE) MG tablet Take 325 mg by mouth daily with breakfast. 05/11/19 05/10/20  [provider]  gabapentin  (NEURONTIN) 600 MG tablet Take by mouth. 02/08/20   [provider]  glipiZIDE (GLUCOTROL) 5 MG tablet Take 2.5-5 mg by mouth 2 (two) times daily before a meal. Take one tablet (5mg ) by mouth at breakfast and one-half tablet (2.5mg ) at lunch    [provider]  metFORMIN (GLUCOPHAGE) 500 MG tablet Take 500 mg by mouth 2 (two) times daily. 09/17/19   [provider]  omeprazole (PRILOSEC) 20 MG capsule Take 1 capsule (20 mg total) by mouth daily. Patient not taking: Reported on 03/15/2020 02/16/20   Jonathon Bellows, MD  pantoprazole (PROTONIX) 40 MG tablet Take 40 mg by mouth daily.  Patient not taking: Reported on 03/15/2020    [provider]  polyethylene glycol (GOLYTELY) 236 g solution Drink 8 oz every 20-30 minutes until entire prep is finished. 03/23/20   Jonathon Bellows, MD  polyethylene glycol powder [facility]) 17 GM/SCOOP powder Take by mouth. 02/08/20   [provider]  QUEtiapine (SEROQUEL) 25 MG tablet  Take 25 mg by mouth 3 (three) times daily. Take one tablet (25 mg) by mouth every morning, one tablet (25 mg) at noon and two tablets (50mg ) at bedtime    [provider]  traZODone (DESYREL) 50 MG tablet Take 50 mg by mouth at bedtime. 04/18/19   [provider]  ziprasidone (GEODON) 20 MG capsule Take 20 mg by mouth 2 (two) times daily. Patient not taking: Reported on 03/15/2020 08/23/18   [provider]    Allergies Promethazine, Sumatriptan, Ciprofloxacin, Doxycycline, Imitrex [sumatriptan base], Morphine and related, Penicillins, Ranitidine hcl, Zantac [ranitidine hcl], and Ketorolac tromethamine    Social History Social History   Tobacco Use   Smoking status: Every Day    Packs/day: 0.50    Years: 52.00    Pack years: 26.00    Types: Cigarettes   Smokeless tobacco: Never  Vaping Use   Vaping Use: Never used  Substance Use Topics   Alcohol use: Not Currently    Comment: glass of wine every once in a while     Drug use: No    Review of Systems Patient denies headaches, rhinorrhea, blurry vision, numbness, shortness of breath, chest pain, edema, cough, abdominal pain, nausea, vomiting, diarrhea, dysuria, fevers, rashes or hallucinations unless otherwise stated above in HPI. ____________________________________________   PHYSICAL EXAM:  VITAL SIGNS: Vitals:   05/01/21 1112 05/01/21 1230  BP: 124/62 (!) 126/43  Pulse:  83  Resp:  17  Temp:    SpO2:  98%    Constitutional: Alert and oriented.  Eyes: Conjunctivae are normal.  Head: Atraumatic. Nose: No congestion/rhinnorhea. Mouth/Throat: Mucous membranes are moist.   Neck: No stridor. Painless ROM.  Cardiovascular: Normal rate, regular rhythm. Grossly normal heart sounds.  Good peripheral circulation. Respiratory: Normal respiratory effort.  No retractions. Lungs CTAB. Gastrointestinal: Soft and nontender. No distention. No abdominal bruits. No CVA tenderness. Genitourinary:  Musculoskeletal: No lower extremity tenderness nor edema.  No joint effusions. Neurologic:  Normal speech and language. No gross focal neurologic deficits are appreciated. No facial droop Skin:  Skin is warm, dry and intact. No rash noted. Psychiatric: Mood and affect are normal. Speech and behavior are normal.  ____________________________________________   LABS (all labs ordered are listed, but only abnormal results are displayed)  Results for orders placed or performed during the hospital encounter of 05/01/21 (from the past 24 hour(s))  CBG monitoring, ED     Status: Abnormal   Collection Time: 05/01/21 10:18 AM  Result Value Ref Range   Glucose-Capillary 361 (H) 70 - 99 mg/dL  Resp Panel by RT-PCR (Flu A&B, Covid) Nasopharyngeal Swab     Status: None   Collection Time: 05/01/21 10:36 AM   Specimen: Nasopharyngeal Swab; Nasopharyngeal(NP) swabs in vial transport medium  Result Value Ref Range   SARS Coronavirus 2 by RT PCR NEGATIVE NEGATIVE    Influenza A by PCR NEGATIVE NEGATIVE   Influenza B by PCR NEGATIVE NEGATIVE  Lactic acid, plasma     Status: Abnormal   Collection Time: 05/01/21 10:36 AM  Result Value Ref Range   Lactic Acid, Venous 2.7 (HH) 0.5 - 1.9 mmol/L  Comprehensive metabolic panel     Status: Abnormal   Collection Time: 05/01/21 10:36 AM  Result Value Ref Range   Sodium 133 (L) 135 - 145 mmol/L   Potassium 5.0 3.5 - 5.1 mmol/L   Chloride 99 98 - 111 mmol/L   CO2 24 22 - 32 mmol/L   Glucose, Bld 352 (H) 70 -  99 mg/dL   BUN 15 8 - 23 mg/dL   Creatinine, Ser 0.87 0.44 - 1.00 mg/dL   Calcium 8.9 8.9 - 10.3 mg/dL   Total Protein 7.0 6.5 - 8.1 g/dL   Albumin 3.5 3.5 - 5.0 g/dL   AST 34 15 - 41 U/L   ALT 33 0 - 44 U/L   Alkaline Phosphatase 76 38 - 126 U/L   Total Bilirubin 0.9 0.3 - 1.2 mg/dL   GFR, Estimated >60 >60 mL/min   Anion gap 10 5 - 15  CBC WITH DIFFERENTIAL     Status: Abnormal   Collection Time: 05/01/21 10:36 AM  Result Value Ref Range   WBC 18.0 (H) 4.0 - 10.5 K/uL   RBC 4.45 3.87 - 5.11 MIL/uL   Hemoglobin 15.1 (H) 12.0 - 15.0 g/dL   HCT 42.0 36.0 - 46.0 %   MCV 94.4 80.0 - 100.0 fL   MCH 33.9 26.0 - 34.0 pg   MCHC 36.0 30.0 - 36.0 g/dL   RDW 12.4 11.5 - 15.5 %   Platelets 227 150 - 400 K/uL   nRBC 0.0 0.0 - 0.2 %   Neutrophils Relative % 86 %   Neutro Abs 15.4 (H) 1.7 - 7.7 K/uL   Lymphocytes Relative 6 %   Lymphs Abs 1.1 0.7 - 4.0 K/uL   Monocytes Relative 7 %   Monocytes Absolute 1.2 (H) 0.1 - 1.0 K/uL   Eosinophils Relative 0 %   Eosinophils Absolute 0.1 0.0 - 0.5 K/uL   Basophils Relative 0 %   Basophils Absolute 0.1 0.0 - 0.1 K/uL   Immature Granulocytes 1 %   Abs Immature Granulocytes 0.11 (H) 0.00 - 0.07 K/uL  Urinalysis, Complete w Microscopic     Status: Abnormal   Collection Time: 05/01/21 10:36 AM  Result Value Ref Range   Color, Urine YELLOW (A) YELLOW   APPearance CLOUDY (A) CLEAR   Specific Gravity, Urine 1.021 1.005 - 1.030   pH 5.0 5.0 - 8.0   Glucose, UA  >=500 (A) NEGATIVE mg/dL   Hgb urine dipstick SMALL (A) NEGATIVE   Bilirubin Urine NEGATIVE NEGATIVE   Ketones, ur 5 (A) NEGATIVE mg/dL   Protein, ur 30 (A) NEGATIVE mg/dL   Nitrite POSITIVE (A) NEGATIVE   Leukocytes,Ua SMALL (A) NEGATIVE   RBC / HPF 0-5 0 - 5 RBC/hpf   WBC, UA >50 (H) 0 - 5 WBC/hpf   Bacteria, UA FEW (A) NONE SEEN   Squamous Epithelial / LPF NONE SEEN 0 - 5   WBC Clumps PRESENT    Mucus PRESENT   Protime-INR     Status: None   Collection Time: 05/01/21 11:10 AM  Result Value Ref Range   Prothrombin Time 14.3 11.4 - 15.2 seconds   INR 1.1 0.8 - 1.2  APTT     Status: None   Collection Time: 05/01/21 11:10 AM  Result Value Ref Range   aPTT 27 24 - 36 seconds   ____________________________________________  EKG My review and personal interpretation at Time: 10:29   Indication: hyperglycemia  Rate: 90  Rhythm: sinus Axis: left Other: lbbb, nonspecific st abn, no stemi ____________________________________________  RADIOLOGY  I personally reviewed all radiographic images ordered to evaluate for the above acute complaints and reviewed radiology reports and findings.  These findings were personally discussed with the patient.  Please see medical record for radiology report.  ____________________________________________   PROCEDURES  Procedure(s) performed:  Procedures    Critical Care performed: no ____________________________________________  INITIAL IMPRESSION / ASSESSMENT AND PLAN / ED COURSE  Pertinent labs & imaging results that were available during my care of the patient were reviewed by me and considered in my medical decision making (see chart for details).   DDX: hyperglycemia, sepsis, uti, bacteremia, pna,   HENRITTA MUTZ is a 69 y.o. who presents to the ED with presentation as described above concern for sepsis she is well perfused and mentating appropriately at this time but is febrile.  Abdominal exam is soft and benign.  Will order septic  work-up she is mildly hyperglycemic we will also evaluate for DKA.  Clinical Course as of 05/01/21 1317  Mon May 01, 2021  1256 Patient's urinalysis is consistent with UTI as likely source of her sepsis.  Hemodynamically stable.  Has received IV fluids as well as IV antibiotics.  Also given insulin for her hyperglycemia.  Pending repeat lactate.  Discussed with hospitalist for admission. [PR]    Clinical Course User Index [PR] Merlyn Lot, MD    The patient was evaluated in Emergency Department today for the symptoms described in the history of present illness. He/she was evaluated in the context of the global COVID-19 pandemic, which necessitated consideration that the patient might be at risk for infection with the SARS-CoV-2 virus that causes COVID-19. Institutional protocols and algorithms that pertain to the evaluation of patients at risk for COVID-19 are in a state of rapid change based on information released by regulatory bodies including the CDC and federal and state organizations. These policies and algorithms were followed during the patient's care in the ED.  As part of my medical decision making, I reviewed the following data within the [place] notes reviewed and incorporated, Labs reviewed, notes from prior ED visits and [place] Controlled Substance Database   ____________________________________________   FINAL CLINICAL IMPRESSION(S) / ED DIAGNOSES  Final diagnoses:  Sepsis without acute organ dysfunction, due to unspecified organism ([place])  Acute cystitis without hematuria  Hyperglycemia      NEW MEDICATIONS STARTED DURING THIS VISIT:  New Prescriptions   No medications on file     Note:  This document was prepared using Dragon voice recognition software and may include unintentional dictation errors.    Merlyn Lot, MD 05/01/21 [phone]

## 2021-05-01 NOTE — ED Notes (Signed)
MD aware of BP; will obtain manual BP.

## 2021-05-01 NOTE — Sepsis Progress Note (Signed)
[facility] is following this sepsis

## 2021-05-01 NOTE — Progress Notes (Signed)
CODE SEPSIS - PHARMACY COMMUNICATION  **Broad Spectrum Antibiotics should be administered within 1 hour of Sepsis diagnosis**  Time Code Sepsis Called/Page Received: 1319  Antibiotics Ordered: ceftriaxone 2g q24  Time of 1st antibiotic administration: [place], PharmD Pharmacy Resident  05/01/2021 1:41 PM

## 2021-05-01 NOTE — ED Triage Notes (Signed)
Pt BIB [facility] from home C/O hyperglycemia. Per EMS, unknown if patient is compliant with medications.  CBG 361 on arrival.

## 2021-05-02 DIAGNOSIS — J449 Chronic obstructive pulmonary disease, unspecified: Secondary | ICD-10-CM | POA: Diagnosis not present

## 2021-05-02 DIAGNOSIS — N3000 Acute cystitis without hematuria: Secondary | ICD-10-CM | POA: Diagnosis not present

## 2021-05-02 DIAGNOSIS — F419 Anxiety disorder, unspecified: Secondary | ICD-10-CM

## 2021-05-02 DIAGNOSIS — A419 Sepsis, unspecified organism: Secondary | ICD-10-CM | POA: Diagnosis not present

## 2021-05-02 LAB — HEMOGLOBIN A1C
Hgb A1c MFr Bld: 11.4 % — ABNORMAL HIGH (ref 4.8–5.6)
Mean Plasma Glucose: 280 mg/dL

## 2021-05-02 LAB — BLOOD CULTURE ID PANEL (REFLEXED) - BCID2

## 2021-05-02 LAB — CBC WITH DIFFERENTIAL/PLATELET
Abs Immature Granulocytes: 0.08 10*3/uL — ABNORMAL HIGH (ref 0.00–0.07)
Basophils Absolute: 0.1 10*3/uL (ref 0.0–0.1)
Basophils Relative: 1 %
Eosinophils Absolute: 0.1 10*3/uL (ref 0.0–0.5)
Eosinophils Relative: 1 %
HCT: 37 % (ref 36.0–46.0)
Hemoglobin: 13 g/dL (ref 12.0–15.0)
Immature Granulocytes: 1 %
Lymphocytes Relative: 25 %
Lymphs Abs: 2.6 10*3/uL (ref 0.7–4.0)
MCH: 33.1 pg (ref 26.0–34.0)
MCHC: 35.1 g/dL (ref 30.0–36.0)
MCV: 94.1 fL (ref 80.0–100.0)
Monocytes Absolute: 0.7 10*3/uL (ref 0.1–1.0)
Monocytes Relative: 7 %
Neutro Abs: 7 10*3/uL (ref 1.7–7.7)
Neutrophils Relative %: 65 %
Platelets: 189 10*3/uL (ref 150–400)
RBC: 3.93 MIL/uL (ref 3.87–5.11)
RDW: 12.3 % (ref 11.5–15.5)
WBC: 10.7 10*3/uL — ABNORMAL HIGH (ref 4.0–10.5)
nRBC: 0 % (ref 0.0–0.2)

## 2021-05-02 LAB — URINE CULTURE: Culture: <10000 — AB

## 2021-05-02 LAB — BASIC METABOLIC PANEL
Anion gap: 3 — ABNORMAL LOW (ref 5–15)
BUN: 8 mg/dL (ref 8–23)
CO2: 28 mmol/L (ref 22–32)
Calcium: 8.4 mg/dL — ABNORMAL LOW (ref 8.9–10.3)
Chloride: 108 mmol/L (ref 98–111)
Creatinine, Ser: 0.77 mg/dL (ref 0.44–1.00)
GFR, Estimated: >60 mL/min (ref >60)
Glucose, Bld: 190 mg/dL — ABNORMAL HIGH (ref 70–99)
Potassium: 4 mmol/L (ref 3.5–5.1)
Sodium: 139 mmol/L (ref 135–145)

## 2021-05-02 LAB — MAGNESIUM: Magnesium: 1.4 mg/dL — ABNORMAL LOW (ref 1.7–2.4)

## 2021-05-02 LAB — GLUCOSE, CAPILLARY: Glucose-Capillary: 266 mg/dL — ABNORMAL HIGH (ref 70–99)

## 2021-05-02 LAB — PROTIME-INR
INR: 1.2 (ref 0.8–1.2)
Prothrombin Time: 14.8 seconds (ref 11.4–15.2)

## 2021-05-02 LAB — PHOSPHORUS: Phosphorus: 2.5 mg/dL (ref 2.5–4.6)

## 2021-05-02 LAB — CORTISOL-AM, BLOOD: Cortisol - AM: 14.8 ug/dL (ref 6.7–22.6)

## 2021-05-02 MED ORDER — CEPHALEXIN 500 MG PO CAPS: 500.0000 mg | ORAL_CAPSULE | Freq: Two times a day (BID) | ORAL | 0 refills | Status: AC

## 2021-05-02 MED ORDER — CEPHALEXIN 500 MG PO CAPS
500.0000 mg | ORAL_CAPSULE | Freq: Two times a day (BID) | ORAL | Status: DC
  Administered 2021-05-02: 10:00:00 500 mg via ORAL
  Filled 2021-05-02: qty 1

## 2021-05-02 MED ORDER — GLIPIZIDE 5 MG PO TABS: 5.0000 mg | ORAL_TABLET | Freq: Two times a day (BID) | ORAL | 1 refills | Status: AC

## 2021-05-02 NOTE — Progress Notes (Signed)
Patient discharged to home. Discharge education provided and all questions answered. All belongings sent home with patient. IV removed.

## 2021-05-02 NOTE — Progress Notes (Signed)
PHARMACY - PHYSICIAN COMMUNICATION CRITICAL VALUE ALERT - BLOOD CULTURE IDENTIFICATION (BCID)  Tammy Armstrong is an 70 y.o. female who presented to [facility] on 05/01/2021 with a chief complaint of sepsis due to UTI  Assessment:  staph species in 1 of 4 bottles, most likely contaminant  (include suspected source if known)  Name of physician (or Provider) Contacted: mansy  Current antibiotics: Ceftriaxone and AZithromycin   Changes to prescribed antibiotics recommended:  Patient is on recommended antibiotics - No changes needed  Results for orders placed or performed during the hospital encounter of 05/01/21  Blood Culture ID Panel (Reflexed) (Collected: 05/01/2021 11:59 AM)  Result Value Ref Range   Enterococcus faecalis NOT DETECTED NOT DETECTED   Enterococcus Faecium NOT DETECTED NOT DETECTED   Listeria monocytogenes NOT DETECTED NOT DETECTED   Staphylococcus species DETECTED (A) NOT DETECTED   Staphylococcus aureus (BCID) NOT DETECTED NOT DETECTED   Staphylococcus epidermidis NOT DETECTED NOT DETECTED   Staphylococcus lugdunensis NOT DETECTED NOT DETECTED   Streptococcus species NOT DETECTED NOT DETECTED   Streptococcus agalactiae NOT DETECTED NOT DETECTED   Streptococcus pneumoniae NOT DETECTED NOT DETECTED   Streptococcus pyogenes NOT DETECTED NOT DETECTED   A.calcoaceticus-baumannii NOT DETECTED NOT DETECTED   Bacteroides fragilis NOT DETECTED NOT DETECTED   Enterobacterales NOT DETECTED NOT DETECTED   Enterobacter cloacae complex NOT DETECTED NOT DETECTED   Escherichia coli NOT DETECTED NOT DETECTED   Klebsiella aerogenes NOT DETECTED NOT DETECTED   Klebsiella oxytoca NOT DETECTED NOT DETECTED   Klebsiella pneumoniae NOT DETECTED NOT DETECTED   Proteus species NOT DETECTED NOT DETECTED   Salmonella species NOT DETECTED NOT DETECTED   Serratia marcescens NOT DETECTED NOT DETECTED   Haemophilus influenzae NOT DETECTED NOT DETECTED   Neisseria meningitidis NOT DETECTED NOT  DETECTED   Pseudomonas aeruginosa NOT DETECTED NOT DETECTED   Stenotrophomonas maltophilia NOT DETECTED NOT DETECTED   Candida albicans NOT DETECTED NOT DETECTED   Candida auris NOT DETECTED NOT DETECTED   Candida glabrata NOT DETECTED NOT DETECTED   Candida krusei NOT DETECTED NOT DETECTED   Candida parapsilosis NOT DETECTED NOT DETECTED   Candida tropicalis NOT DETECTED NOT DETECTED   Cryptococcus neoformans/gattii NOT DETECTED NOT DETECTED    Bilan Tedesco D 05/02/2021  4:19 AM

## 2021-05-02 NOTE — TOC Transition Note (Signed)
Transition of Care [facility]) - CM/SW Discharge Note   Patient Details  Name: Tammy Armstrong MRN: [id number] Date of Birth: September 19, 1950  Transition of Care [facility]) CM/SW Contact:  Pete Pelt, RN Phone Number: 05/02/2021, 10:01 AM   Clinical Narrative:   Patient states she has roommate at home.  Friends transport her to appointments and pharmacy.   Patient states she takes medications at home and daughter helps her as well.  Declines DME, states she has walker at home and does not feel she needs any other equipment.    She is a patient of [facility], jason-coordinator aware.  Patient received nurse and PT at home, confirmed they can resume services.      Final next level of care: Home w Home Health Services Barriers to Discharge: Barriers Resolved   Patient Goals and CMS Choice     Choice offered to / list presented to : NA  Discharge Placement                       Discharge Plan and Services   Discharge Planning Services: CM Consult Post Acute Care Choice: Resumption of Svcs/PTA Provider          DME Arranged:  (Declines DME, patient states she has what she needs at home)         HH Arranged: RN, PT [facility] Agency: [place] ([place]) Date HH Agency Contacted: 05/02/21 Time [place]: [phone] Representative spoke with at [place]: Corene Cornea - coordinator  Social Determinants of Health ([place]) Interventions     Readmission Risk Interventions Readmission Risk Prevention Plan 10/28/2019  Transportation Screening Complete  PCP or Specialist Appt within 3-5 Days Complete  HRI or [place] Complete  Social Work Consult for [place] Planning/Counseling Complete  Palliative Care Screening Not Applicable  Medication Review Press photographer) Complete  Some recent data might be hidden

## 2021-05-02 NOTE — Discharge Summary (Signed)
[facility] [facility] [place] NAME: Tammy Armstrong    MR#:  [id number]  DATE OF BIRTH:  Dec 11, 1950  DATE OF ADMISSION:  05/01/2021 ADMITTING PHYSICIAN: Tammy N Cox, DO  DATE OF DISCHARGE: 05/02/2021  PRIMARY CARE PHYSICIAN: Tammy Curry, MD    ADMISSION DIAGNOSIS:  Hyperglycemia [R73.9] Acute cystitis without hematuria [N30.00] Sepsis due to urinary tract infection ([place]) [A41.9, N39.0] Sepsis without acute organ dysfunction, due to unspecified organism ([place]) [A41.9]  DISCHARGE DIAGNOSIS:  Sepsis --improved UTI Dm-2 with hyperglycemia  SECONDARY DIAGNOSIS:   Past Medical History:  Diagnosis Date   Anxiety    Arthritis    Carotid artery occlusion    Cigarette smoker one half pack a day or less    COPD (chronic obstructive pulmonary disease) ([place]) 07/04/2011   Depression    Diabetes mellitus    Diabetes mellitus 07/04/2011   GERD (gastroesophageal reflux disease)    Hepatitis C    Hypercholesterolemia    Hypertension    Migraines    Pancreatitis    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 female with medical history significant for hypertension, GERD, hypercholesterolemia, tobacco use disorder, non-insulin-dependent diabetes mellitus, depression, anxiety, COPD, who presents emergency department for chief concerns of hyperglycemia.  Sepsis POA -improving Source likely urine --change to po Kelfex BC neg UC insignificant growth No fever WBC 18K--10K -procalcitonin 1.7 - Ceftriaxone 2 g IV every 24 hours--change to oral abxs --cxr no PNA. Pt has no cough or sob    Hypertension - resumed amlodipine ,, lisinopril, metoprolol   Hyperlipidemia--atorvastatin  Tobacco dependence-ordered nicotine patch daily as needed for nicotine craving   Non-insulin-dependent diabetes  mellitus -insulin SSI with at bedtime coverage ordered - Resumed home metformin 500 mg twice daily with meals - Glipizide has been held at this time - sugars much improved. Patient will also resume her Ozempic -- she is advised to keep log of her sugars and discussed with her primary care.   GERD-PPI    Insomnia /chronic anxiety- resumed home clonazepam   COPD prn nebs  patient overall hemodynamically stable. She is very anxious and wants to go home since some medications has to be delivered to her home. I did not want to have patient leave AMA and henceforth discharging her to go home with PO antibiotic. She is recommended to keep tab of her sugars and follow-up with her primary care as outpatient. She is advised to come to the emergency room if sign symptoms worsen. Patient is in agreement with the plan   CONSULTS OBTAINED:    DRUG ALLERGIES:   Allergies  Allergen Reactions   Promethazine Other (See Comments)    studdering Other Reaction: IMPAIRS SPEECH, UNABLE TO VERB studdering Other Reaction: IMPAIRS SPEECH, UNABLE TO VERB    Sumatriptan     Other reaction(s): Other (See Comments) Other Reaction: PANIC ATTACK   Ciprofloxacin Other (See Comments)    Medication is contraindicated with some of pts other meds.   Delirium Delirium    Doxycycline Nausea And Vomiting   Imitrex [Sumatriptan Base] Other (See Comments)    Pt states that it "puts her out of this world".     Morphine And Related Nausea And Vomiting   Penicillins Nausea And Vomiting and Other (See Comments)  Pt is unable to answer additional questions about this medication.     Ranitidine Hcl Other (See Comments)    studdering studdering unknown    Zantac [Ranitidine Hcl] Other (See Comments)    Reaction:  Unknown    Ketorolac Tromethamine Rash    DISCHARGE MEDICATIONS:   Allergies as of 05/02/2021       Reactions   Promethazine Other (See Comments)   studdering Other Reaction: IMPAIRS SPEECH,  UNABLE TO VERB studdering Other Reaction: IMPAIRS SPEECH, UNABLE TO VERB   Sumatriptan    Other reaction(s): Other (See Comments) Other Reaction: PANIC ATTACK   Ciprofloxacin Other (See Comments)   Medication is contraindicated with some of pts other meds.   Delirium Delirium   Doxycycline Nausea And Vomiting   Imitrex [sumatriptan Base] Other (See Comments)   Pt states that it "puts her out of this world".     Morphine And Related Nausea And Vomiting   Penicillins Nausea And Vomiting, Other (See Comments)   Pt is unable to answer additional questions about this medication.     Ranitidine Hcl Other (See Comments)   studdering studdering unknown   Zantac [ranitidine Hcl] Other (See Comments)   Reaction:  Unknown    Ketorolac Tromethamine Rash        Medication List     STOP taking these medications    omeprazole 20 MG capsule Commonly known as: PRILOSEC   pantoprazole 40 MG tablet Commonly known as: PROTONIX   polyethylene glycol 236 g solution Commonly known as: GoLYTELY       TAKE these medications    acetaminophen 500 MG tablet Commonly known as: TYLENOL Take 1,000 mg by mouth every 8 (eight) hours as needed for pain.   albuterol 108 (90 Base) MCG/ACT inhaler Commonly known as: VENTOLIN HFA   amLODipine 10 MG tablet Commonly known as: NORVASC Take 10 mg by mouth daily.   atorvastatin 80 MG tablet Commonly known as: LIPITOR Take 80 mg by mouth daily.   busPIRone 15 MG tablet Commonly known as: BUSPAR Take 15 mg by mouth 3 (three) times daily.   cephALEXin 500 MG capsule Commonly known as: KEFLEX Take 1 capsule (500 mg total) by mouth every 12 (twelve) hours for 5 days.   clonazePAM 1 MG tablet Commonly known as: KLONOPIN Take 1 mg by mouth at bedtime as needed.   diclofenac sodium 1 % Gel Commonly known as: VOLTAREN Apply 2 g topically 4 (four) times daily as needed (back).   DULoxetine 60 MG capsule Commonly known as: CYMBALTA Take 60  mg by mouth daily.   ferrous sulfate 325 (65 FE) MG tablet Take 325 mg by mouth daily with breakfast.   gabapentin 600 MG tablet Commonly known as: NEURONTIN Take 600 mg by mouth daily.   glipiZIDE 5 MG tablet Commonly known as: GLUCOTROL Take 1 tablet (5 mg total) by mouth 2 (two) times daily before a meal. Take one tablet (5mg ) by mouth at breakfast and one-half tablet (2.5mg ) at lunch What changed: how much to take   lisinopril 10 MG tablet Commonly known as: ZESTRIL Take 10 mg by mouth in the morning and at bedtime. What changed: Another medication with the same name was removed. Continue taking this medication, and follow the directions you see here.   metFORMIN 500 MG tablet Commonly known as: GLUCOPHAGE Take 500 mg by mouth 2 (two) times daily.   metoprolol succinate 50 MG 24 hr tablet Commonly known as: TOPROL-XL Take 50 mg by  mouth daily.   nystatin powder Commonly known as: MYCOSTATIN/NYSTOP Apply 1 application topically 2 (two) times daily. What changed: Another medication with the same name was removed. Continue taking this medication, and follow the directions you see here.   Ozempic (0.25 or 0.5 MG/DOSE) 2 MG/1.5ML Sopn Generic drug: Semaglutide(0.25 or 0.5MG /DOS) Inject 0.25 mg into the skin once a week.   polyethylene glycol powder 17 GM/SCOOP powder Commonly known as: GLYCOLAX/MIRALAX Take by mouth daily as needed.   QUEtiapine 25 MG tablet Commonly known as: SEROQUEL Take 25 mg by mouth 3 (three) times daily. Take one tablet (25 mg) by mouth every morning, one tablet (25 mg) at noon and two tablets (50mg ) at bedtime   tiZANidine 2 MG tablet Commonly known as: ZANAFLEX Take 2 mg by mouth 2 (two) times daily as needed for muscle spasms.   traZODone 50 MG tablet Commonly known as: DESYREL Take 50 mg by mouth at bedtime.   valbenazine 80 MG capsule Commonly known as: INGREZZA Take 80 mg by mouth daily.   ziprasidone 20 MG capsule Commonly known  as: GEODON Take 20 mg by mouth 2 (two) times daily.        If you experience worsening of your admission symptoms, develop shortness of breath, life threatening emergency, suicidal or homicidal thoughts you must seek medical attention immediately by calling 911 or calling your MD immediately  if symptoms less severe.  You Must read complete instructions/literature along with all the possible adverse reactions/side effects for all the Medicines you take and that have been prescribed to you. Take any new Medicines after you have completely understood and accept all the possible adverse reactions/side effects.   Please note  You were cared for by a hospitalist during your hospital stay. If you have any questions about your discharge medications or the care you received while you were in the hospital after you are discharged, you can call the unit and asked to speak with the hospitalist on call if the hospitalist that took care of you is not available. Once you are discharged, your primary care physician will handle any further medical issues. Please note that NO REFILLS for any discharge medications will be authorized once you are discharged, as it is imperative that you return to your primary care physician (or establish a relationship with a primary care physician if you do not have one) for your aftercare needs so that they can reassess your need for medications and monitor your lab values. Today   SUBJECTIVE  Hard of hearing Anxious to go home No family at bedside Ate BF. No fever   VITAL SIGNS:  Blood pressure (!) 151/61, pulse 74, temperature 98.3 F (36.8 C), resp. rate 17, height 5\' 1"  (1.549 m), weight 78.3 kg, SpO2 99 %.  I/O:   Intake/Output Summary (Last 24 hours) at 05/02/2021 1008 Last data filed at 05/02/2021 0313 Gross per 24 hour  Intake 3040 ml  Output --  Net 3040 ml    PHYSICAL EXAMINATION:  GENERAL:  70 y.o.-year-old patient lying in the bed with no acute  distress.    LUNGS: Normal breath sounds bilaterally, no wheezing, rales,rhonchi or crepitation. No use of accessory muscles of respiration.  CARDIOVASCULAR: S1, S2 normal. No murmurs, rubs, or gallops.  ABDOMEN: Soft, non-tender, non-distended. Bowel sounds present. No organomegaly or mass.  EXTREMITIES: No pedal edema, cyanosis, or clubbing.  NEUROLOGIC: Cranial nerves II through XII are intact. Muscle strength 5/5 in all extremities. Sensation intact. Gait not checked.  PSYCHIATRIC patient is alert  andanxious SKIN: No obvious rash, lesion, or ulcer.   DATA REVIEW:   CBC  Recent Labs  Lab 05/02/21 0410  WBC 10.7*  HGB 13.0  HCT 37.0  PLT 189    Chemistries  Recent Labs  Lab 05/01/21 1036 05/02/21 0410  NA 133* 139  K 5.0 4.0  CL 99 108  CO2 24 28  GLUCOSE 352* 190*  BUN 15 8  CREATININE 0.87 0.77  CALCIUM 8.9 8.4*  MG  --  1.4*  AST 34  --   ALT 33  --   ALKPHOS 76  --   BILITOT 0.9  --     Microbiology Results   Recent Results (from the past 240 hour(s))  Resp Panel by RT-PCR (Flu A&B, Covid) Nasopharyngeal Swab     Status: None   Collection Time: 05/01/21 10:36 AM   Specimen: Nasopharyngeal Swab; Nasopharyngeal(NP) swabs in vial transport medium  Result Value Ref Range Status   SARS Coronavirus 2 by RT PCR NEGATIVE NEGATIVE Final    Comment: (NOTE) SARS-CoV-2 target nucleic acids are NOT DETECTED.  The SARS-CoV-2 RNA is generally detectable in upper respiratory specimens during the acute phase of infection. The lowest concentration of SARS-CoV-2 viral copies this assay can detect is 138 copies/mL. A negative result does not preclude SARS-Cov-2 infection and should not be used as the sole basis for treatment or other patient management decisions. A negative result may occur with  improper specimen collection/handling, submission of specimen other than nasopharyngeal swab, presence of viral mutation(s) within the areas targeted by this assay, and  inadequate number of viral copies(<138 copies/mL). A negative result must be combined with clinical observations, patient history, and epidemiological information. The expected result is Negative.  Fact Sheet for Patients:  EntrepreneurPulse.com.au  Fact Sheet for Healthcare Providers:  IncredibleEmployment.be  This test is no t yet approved or cleared by the [place] FDA and  has been authorized for detection and/or diagnosis of SARS-CoV-2 by FDA under an Emergency Use Authorization (EUA). This EUA will remain  in effect (meaning this test can be used) for the duration of the COVID-19 declaration under Section 564(b)(1) of the Act, 21 U.S.C.section 360bbb-3(b)(1), unless the authorization is terminated  or revoked sooner.       Influenza A by PCR NEGATIVE NEGATIVE Final   Influenza B by PCR NEGATIVE NEGATIVE Final    Comment: (NOTE) The Xpert Xpress SARS-CoV-2/FLU/RSV plus assay is intended as an aid in the diagnosis of influenza from Nasopharyngeal swab specimens and should not be used as a sole basis for treatment. Nasal washings and aspirates are unacceptable for Xpert Xpress SARS-CoV-2/FLU/RSV testing.  Fact Sheet for Patients: EntrepreneurPulse.com.au  Fact Sheet for Healthcare Providers: IncredibleEmployment.be  This test is not yet approved or cleared by the [place] FDA and has been authorized for detection and/or diagnosis of SARS-CoV-2 by FDA under an Emergency Use Authorization (EUA). This EUA will remain in effect (meaning this test can be used) for the duration of the COVID-19 declaration under Section 564(b)(1) of the Act, 21 U.S.C. section 360bbb-3(b)(1), unless the authorization is terminated or revoked.  Performed at [facility], [address] [place], [place] [address]   Blood Culture (routine x 2)     Status: None (Preliminary result)   Collection Time:  05/01/21 10:36 AM   Specimen: BLOOD  Result Value Ref Range Status   Specimen Description BLOOD RIGHT ANTECUBITAL  Final   Special Requests   Final  BOTTLES DRAWN AEROBIC AND ANAEROBIC Blood Culture results may not be optimal due to an inadequate volume of blood received in culture bottles   Culture   Final    NO GROWTH < 24 HOURS Performed at [facility], [address] [place], [place] [address]    Report Status PENDING  Incomplete  Urine Culture     Status: Abnormal   Collection Time: 05/01/21 10:36 AM   Specimen: Urine, Clean Catch  Result Value Ref Range Status   Specimen Description   Final    URINE, CLEAN CATCH Performed at [facility], [address], [place], [place] [address]    Special Requests   Final    NONE Performed at [facility], [address] [place], [place] [address]    Culture (A)  Final    <10,000 COLONIES/mL INSIGNIFICANT GROWTH Performed at [facility], [address], [place], [place] [address]    Report Status 05/02/2021 FINAL  Final  Blood Culture (routine x 2)     Status: None (Preliminary result)   Collection Time: 05/01/21 11:59 AM   Specimen: BLOOD  Result Value Ref Range Status   Specimen Description BLOOD BLOOD LEFT HAND  Final   Special Requests   Final    BOTTLES DRAWN AEROBIC AND ANAEROBIC Blood Culture results may not be optimal due to an inadequate volume of blood received in culture bottles   Culture  Setup Time   Final    GRAM POSITIVE COCCI ANAEROBIC BOTTLE ONLY Organism ID to follow CRITICAL RESULT CALLED TO, READ BACK BY AND VERIFIED WITH: JASON ROBBINS@0400  05/02/21 RH Performed at [facility], [address] [place], [place] [address]    Culture GRAM POSITIVE COCCI  Final   Report Status PENDING  Incomplete  Blood Culture ID Panel (Reflexed)     Status: Abnormal   Collection Time: 05/01/21 11:59 AM  Result Value Ref Range Status   Enterococcus faecalis NOT DETECTED NOT  DETECTED Final   Enterococcus Faecium NOT DETECTED NOT DETECTED Final   Listeria monocytogenes NOT DETECTED NOT DETECTED Final   Staphylococcus species DETECTED (A) NOT DETECTED Final    Comment: CRITICAL RESULT CALLED TO, READ BACK BY AND VERIFIED WITH: JASON ROBBINS@0400  05/02/21 RH    Staphylococcus aureus (BCID) NOT DETECTED NOT DETECTED Final   Staphylococcus epidermidis NOT DETECTED NOT DETECTED Final   Staphylococcus lugdunensis NOT DETECTED NOT DETECTED Final   Streptococcus species NOT DETECTED NOT DETECTED Final   Streptococcus agalactiae NOT DETECTED NOT DETECTED Final   Streptococcus pneumoniae NOT DETECTED NOT DETECTED Final   Streptococcus pyogenes NOT DETECTED NOT DETECTED Final   A.calcoaceticus-baumannii NOT DETECTED NOT DETECTED Final   Bacteroides fragilis NOT DETECTED NOT DETECTED Final   Enterobacterales NOT DETECTED NOT DETECTED Final   Enterobacter cloacae complex NOT DETECTED NOT DETECTED Final   Escherichia coli NOT DETECTED NOT DETECTED Final   Klebsiella aerogenes NOT DETECTED NOT DETECTED Final   Klebsiella oxytoca NOT DETECTED NOT DETECTED Final   Klebsiella pneumoniae NOT DETECTED NOT DETECTED Final   Proteus species NOT DETECTED NOT DETECTED Final   Salmonella species NOT DETECTED NOT DETECTED Final   Serratia marcescens NOT DETECTED NOT DETECTED Final   Haemophilus influenzae NOT DETECTED NOT DETECTED Final   Neisseria meningitidis NOT DETECTED NOT DETECTED Final   Pseudomonas aeruginosa NOT DETECTED NOT DETECTED Final   Stenotrophomonas maltophilia NOT DETECTED NOT DETECTED Final   Candida albicans NOT DETECTED NOT DETECTED Final   Candida auris  NOT DETECTED NOT DETECTED Final   Candida glabrata NOT DETECTED NOT DETECTED Final   Candida krusei NOT DETECTED NOT DETECTED Final   Candida parapsilosis NOT DETECTED NOT DETECTED Final   Candida tropicalis NOT DETECTED NOT DETECTED Final   Cryptococcus neoformans/gattii NOT DETECTED NOT DETECTED Final     Comment: Performed at [facility], [address], [place], [place] [address]    RADIOLOGY:  DG Chest Port 1 View  Result Date: 05/01/2021 CLINICAL DATA:  70 year old female with history of possible sepsis. EXAM: PORTABLE CHEST 1 VIEW COMPARISON:  Chest x-ray 08/22/2019. FINDINGS: Lung volumes are normal. No consolidative airspace disease. No pleural effusions. No pneumothorax. No pulmonary nodule or mass noted. Pulmonary vasculature and the cardiomediastinal silhouette are within normal limits. Atherosclerosis in the thoracic aorta. IMPRESSION: 1. No radiographic evidence of acute cardiopulmonary disease. 2. Aortic atherosclerosis. Electronically Signed   By: Vinnie Langton M.D.   On: 05/01/2021 11:16     CODE STATUS:     Code Status Orders  (From admission, onward)           Start     Ordered   05/01/21 1312  Full code  Continuous        05/01/21 1314           Code Status History     Date Active Date Inactive Code Status Order ID Comments User Context   10/27/2019 0250 10/29/2019 2017 Full Code [id number]  Athena Masse, MD ED   05/27/2019 0639 05/28/2019 2014 Full Code [id number]  Rise Patience, MD ED   10/12/2018 1211 10/14/2018 1447 Full Code [id number]  Dustin Flock, MD Inpatient   06/28/2016 0022 06/28/2016 2152 Full Code [id number]  Hugelmeyer, Alexis, DO Inpatient   05/18/2015 1507 05/19/2015 1716 Full Code [id number]  Algernon Huxley, MD Inpatient   04/06/2015 2123 04/08/2015 1702 Full Code [id number]  Bettey Costa, MD Inpatient        TOTAL TIME TAKING CARE OF THIS PATIENT: 40 minutes.    Fritzi Mandes M.D  [facility]    CC: Primary care physician; Tammy Curry, MD

## 2021-05-06 LAB — CULTURE, BLOOD (ROUTINE X 2): Culture: NO GROWTH

## 2021-06-29 ENCOUNTER — Other Ambulatory Visit: Payer: Self-pay
